# Patient Record
Sex: Female | Born: 1945 | Race: White | Hispanic: No | Smoking: Former smoker
Health system: Southern US, Community
[De-identification: ages and names within clinical notes are randomized; demographics above are authoritative.]

## PROBLEM LIST (undated history)

## (undated) DIAGNOSIS — I499 Cardiac arrhythmia, unspecified: Secondary | ICD-10-CM

## (undated) DIAGNOSIS — J45909 Unspecified asthma, uncomplicated: Secondary | ICD-10-CM

## (undated) DIAGNOSIS — I2510 Atherosclerotic heart disease of native coronary artery without angina pectoris: Secondary | ICD-10-CM

## (undated) DIAGNOSIS — F32A Depression, unspecified: Secondary | ICD-10-CM

## (undated) DIAGNOSIS — H269 Unspecified cataract: Secondary | ICD-10-CM

## (undated) DIAGNOSIS — C4491 Basal cell carcinoma of skin, unspecified: Secondary | ICD-10-CM

## (undated) DIAGNOSIS — F329 Major depressive disorder, single episode, unspecified: Secondary | ICD-10-CM

## (undated) DIAGNOSIS — S32050A Wedge compression fracture of fifth lumbar vertebra, initial encounter for closed fracture: Secondary | ICD-10-CM

## (undated) DIAGNOSIS — M79609 Pain in unspecified limb: Secondary | ICD-10-CM

## (undated) DIAGNOSIS — E7800 Pure hypercholesterolemia, unspecified: Secondary | ICD-10-CM

## (undated) DIAGNOSIS — I714 Abdominal aortic aneurysm, without rupture, unspecified: Secondary | ICD-10-CM

## (undated) HISTORY — DX: Abdominal aortic aneurysm, without rupture, unspecified: I71.40

## (undated) HISTORY — PX: NOSE SURGERY: SHX723

## (undated) HISTORY — PX: ESOPHAGEAL DILATION: SHX303

## (undated) HISTORY — DX: Unspecified cataract: H26.9

## (undated) HISTORY — PX: BREAST ENHANCEMENT SURGERY: SHX7

## (undated) HISTORY — DX: Pain in unspecified limb: M79.609

## (undated) HISTORY — DX: Atherosclerotic heart disease of native coronary artery without angina pectoris: I25.10

## (undated) HISTORY — PX: OTHER SURGICAL HISTORY: SHX169

## (undated) HISTORY — PX: EXCISION VAGINAL CYST: SHX5825

## (undated) HISTORY — DX: Wedge compression fracture of fifth lumbar vertebra, initial encounter for closed fracture: S32.050A

## (undated) HISTORY — PX: ABDOMINAL HYSTERECTOMY: SHX81

## (undated) HISTORY — PX: AUGMENTATION MAMMAPLASTY: SUR837

## (undated) HISTORY — PX: COLONOSCOPY: SHX174

## (undated) HISTORY — PX: HEMORRHOID SURGERY: SHX153

---
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
## 2011-11-13 DIAGNOSIS — Z462 Encounter for fitting and adjustment of other devices related to nervous system and special senses: Secondary | ICD-10-CM | POA: Diagnosis not present

## 2011-11-19 DIAGNOSIS — C44319 Basal cell carcinoma of skin of other parts of face: Secondary | ICD-10-CM | POA: Diagnosis not present

## 2011-11-27 DIAGNOSIS — C44319 Basal cell carcinoma of skin of other parts of face: Secondary | ICD-10-CM | POA: Diagnosis not present

## 2011-11-27 DIAGNOSIS — S0180XA Unspecified open wound of other part of head, initial encounter: Secondary | ICD-10-CM | POA: Diagnosis not present

## 2011-12-20 DIAGNOSIS — M5090 Cervical disc disorder, unspecified, unspecified cervical region: Secondary | ICD-10-CM | POA: Diagnosis not present

## 2011-12-20 DIAGNOSIS — M47812 Spondylosis without myelopathy or radiculopathy, cervical region: Secondary | ICD-10-CM | POA: Diagnosis not present

## 2011-12-20 DIAGNOSIS — M5030 Other cervical disc degeneration, unspecified cervical region: Secondary | ICD-10-CM | POA: Diagnosis not present

## 2011-12-20 DIAGNOSIS — M542 Cervicalgia: Secondary | ICD-10-CM | POA: Diagnosis not present

## 2011-12-20 DIAGNOSIS — M5020 Other cervical disc displacement, unspecified cervical region: Secondary | ICD-10-CM | POA: Diagnosis not present

## 2011-12-24 DIAGNOSIS — R51 Headache: Secondary | ICD-10-CM | POA: Diagnosis not present

## 2011-12-24 DIAGNOSIS — R079 Chest pain, unspecified: Secondary | ICD-10-CM | POA: Diagnosis not present

## 2011-12-24 DIAGNOSIS — M79609 Pain in unspecified limb: Secondary | ICD-10-CM | POA: Diagnosis not present

## 2011-12-26 DIAGNOSIS — M19079 Primary osteoarthritis, unspecified ankle and foot: Secondary | ICD-10-CM | POA: Diagnosis not present

## 2011-12-26 DIAGNOSIS — Q742 Other congenital malformations of lower limb(s), including pelvic girdle: Secondary | ICD-10-CM | POA: Diagnosis not present

## 2011-12-26 DIAGNOSIS — M79609 Pain in unspecified limb: Secondary | ICD-10-CM | POA: Diagnosis not present

## 2011-12-30 DIAGNOSIS — R079 Chest pain, unspecified: Secondary | ICD-10-CM | POA: Diagnosis not present

## 2012-01-03 DIAGNOSIS — M542 Cervicalgia: Secondary | ICD-10-CM | POA: Diagnosis not present

## 2012-01-27 DIAGNOSIS — M629 Disorder of muscle, unspecified: Secondary | ICD-10-CM | POA: Diagnosis not present

## 2012-01-30 DIAGNOSIS — M19079 Primary osteoarthritis, unspecified ankle and foot: Secondary | ICD-10-CM | POA: Diagnosis not present

## 2012-01-30 DIAGNOSIS — M216X9 Other acquired deformities of unspecified foot: Secondary | ICD-10-CM | POA: Diagnosis not present

## 2012-01-30 DIAGNOSIS — Q742 Other congenital malformations of lower limb(s), including pelvic girdle: Secondary | ICD-10-CM | POA: Diagnosis not present

## 2012-01-30 DIAGNOSIS — M79609 Pain in unspecified limb: Secondary | ICD-10-CM | POA: Diagnosis not present

## 2012-02-07 DIAGNOSIS — M47812 Spondylosis without myelopathy or radiculopathy, cervical region: Secondary | ICD-10-CM | POA: Diagnosis not present

## 2012-02-11 DIAGNOSIS — H65199 Other acute nonsuppurative otitis media, unspecified ear: Secondary | ICD-10-CM | POA: Diagnosis not present

## 2012-02-11 DIAGNOSIS — F329 Major depressive disorder, single episode, unspecified: Secondary | ICD-10-CM | POA: Diagnosis not present

## 2012-02-11 DIAGNOSIS — G4700 Insomnia, unspecified: Secondary | ICD-10-CM | POA: Diagnosis not present

## 2012-02-27 DIAGNOSIS — M19079 Primary osteoarthritis, unspecified ankle and foot: Secondary | ICD-10-CM | POA: Diagnosis not present

## 2012-02-27 DIAGNOSIS — M659 Synovitis and tenosynovitis, unspecified: Secondary | ICD-10-CM | POA: Diagnosis not present

## 2012-02-27 DIAGNOSIS — M216X9 Other acquired deformities of unspecified foot: Secondary | ICD-10-CM | POA: Diagnosis not present

## 2012-02-27 DIAGNOSIS — M79609 Pain in unspecified limb: Secondary | ICD-10-CM | POA: Diagnosis not present

## 2012-02-28 DIAGNOSIS — M542 Cervicalgia: Secondary | ICD-10-CM | POA: Diagnosis not present

## 2012-03-02 DIAGNOSIS — M542 Cervicalgia: Secondary | ICD-10-CM | POA: Diagnosis not present

## 2012-03-02 DIAGNOSIS — M47812 Spondylosis without myelopathy or radiculopathy, cervical region: Secondary | ICD-10-CM | POA: Diagnosis not present

## 2012-03-02 DIAGNOSIS — M6281 Muscle weakness (generalized): Secondary | ICD-10-CM | POA: Diagnosis not present

## 2012-03-03 DIAGNOSIS — G4700 Insomnia, unspecified: Secondary | ICD-10-CM | POA: Diagnosis not present

## 2012-03-03 DIAGNOSIS — F329 Major depressive disorder, single episode, unspecified: Secondary | ICD-10-CM | POA: Diagnosis not present

## 2012-03-03 DIAGNOSIS — J209 Acute bronchitis, unspecified: Secondary | ICD-10-CM | POA: Diagnosis not present

## 2012-03-03 DIAGNOSIS — J301 Allergic rhinitis due to pollen: Secondary | ICD-10-CM | POA: Diagnosis not present

## 2012-03-04 DIAGNOSIS — M47812 Spondylosis without myelopathy or radiculopathy, cervical region: Secondary | ICD-10-CM | POA: Diagnosis not present

## 2012-03-04 DIAGNOSIS — M6281 Muscle weakness (generalized): Secondary | ICD-10-CM | POA: Diagnosis not present

## 2012-03-04 DIAGNOSIS — M542 Cervicalgia: Secondary | ICD-10-CM | POA: Diagnosis not present

## 2012-03-05 DIAGNOSIS — M799 Soft tissue disorder, unspecified: Secondary | ICD-10-CM | POA: Diagnosis not present

## 2012-03-05 DIAGNOSIS — M19079 Primary osteoarthritis, unspecified ankle and foot: Secondary | ICD-10-CM | POA: Diagnosis not present

## 2012-03-05 DIAGNOSIS — M25579 Pain in unspecified ankle and joints of unspecified foot: Secondary | ICD-10-CM | POA: Diagnosis not present

## 2012-03-09 DIAGNOSIS — M6281 Muscle weakness (generalized): Secondary | ICD-10-CM | POA: Diagnosis not present

## 2012-03-09 DIAGNOSIS — M542 Cervicalgia: Secondary | ICD-10-CM | POA: Diagnosis not present

## 2012-03-09 DIAGNOSIS — M47812 Spondylosis without myelopathy or radiculopathy, cervical region: Secondary | ICD-10-CM | POA: Diagnosis not present

## 2012-03-11 DIAGNOSIS — M542 Cervicalgia: Secondary | ICD-10-CM | POA: Diagnosis not present

## 2012-03-11 DIAGNOSIS — M47812 Spondylosis without myelopathy or radiculopathy, cervical region: Secondary | ICD-10-CM | POA: Diagnosis not present

## 2012-03-11 DIAGNOSIS — M6281 Muscle weakness (generalized): Secondary | ICD-10-CM | POA: Diagnosis not present

## 2012-03-12 DIAGNOSIS — M659 Synovitis and tenosynovitis, unspecified: Secondary | ICD-10-CM | POA: Diagnosis not present

## 2012-03-12 DIAGNOSIS — M79609 Pain in unspecified limb: Secondary | ICD-10-CM | POA: Diagnosis not present

## 2012-03-12 DIAGNOSIS — M216X9 Other acquired deformities of unspecified foot: Secondary | ICD-10-CM | POA: Diagnosis not present

## 2012-03-12 DIAGNOSIS — M19079 Primary osteoarthritis, unspecified ankle and foot: Secondary | ICD-10-CM | POA: Diagnosis not present

## 2012-03-16 DIAGNOSIS — M47812 Spondylosis without myelopathy or radiculopathy, cervical region: Secondary | ICD-10-CM | POA: Diagnosis not present

## 2012-03-16 DIAGNOSIS — M6281 Muscle weakness (generalized): Secondary | ICD-10-CM | POA: Diagnosis not present

## 2012-03-16 DIAGNOSIS — M542 Cervicalgia: Secondary | ICD-10-CM | POA: Diagnosis not present

## 2012-03-18 DIAGNOSIS — M542 Cervicalgia: Secondary | ICD-10-CM | POA: Diagnosis not present

## 2012-03-18 DIAGNOSIS — M47812 Spondylosis without myelopathy or radiculopathy, cervical region: Secondary | ICD-10-CM | POA: Diagnosis not present

## 2012-03-18 DIAGNOSIS — M6281 Muscle weakness (generalized): Secondary | ICD-10-CM | POA: Diagnosis not present

## 2012-03-24 DIAGNOSIS — M6281 Muscle weakness (generalized): Secondary | ICD-10-CM | POA: Diagnosis not present

## 2012-03-24 DIAGNOSIS — M542 Cervicalgia: Secondary | ICD-10-CM | POA: Diagnosis not present

## 2012-03-24 DIAGNOSIS — M47812 Spondylosis without myelopathy or radiculopathy, cervical region: Secondary | ICD-10-CM | POA: Diagnosis not present

## 2012-03-26 DIAGNOSIS — R1310 Dysphagia, unspecified: Secondary | ICD-10-CM | POA: Diagnosis not present

## 2012-03-26 DIAGNOSIS — R51 Headache: Secondary | ICD-10-CM | POA: Diagnosis not present

## 2012-03-31 DIAGNOSIS — M47812 Spondylosis without myelopathy or radiculopathy, cervical region: Secondary | ICD-10-CM | POA: Diagnosis not present

## 2012-03-31 DIAGNOSIS — M6281 Muscle weakness (generalized): Secondary | ICD-10-CM | POA: Diagnosis not present

## 2012-03-31 DIAGNOSIS — M542 Cervicalgia: Secondary | ICD-10-CM | POA: Diagnosis not present

## 2012-04-07 DIAGNOSIS — M542 Cervicalgia: Secondary | ICD-10-CM | POA: Diagnosis not present

## 2012-04-07 DIAGNOSIS — M47812 Spondylosis without myelopathy or radiculopathy, cervical region: Secondary | ICD-10-CM | POA: Diagnosis not present

## 2012-04-07 DIAGNOSIS — M6281 Muscle weakness (generalized): Secondary | ICD-10-CM | POA: Diagnosis not present

## 2012-04-09 DIAGNOSIS — M659 Synovitis and tenosynovitis, unspecified: Secondary | ICD-10-CM | POA: Diagnosis not present

## 2012-04-09 DIAGNOSIS — M19079 Primary osteoarthritis, unspecified ankle and foot: Secondary | ICD-10-CM | POA: Diagnosis not present

## 2012-04-09 DIAGNOSIS — M79609 Pain in unspecified limb: Secondary | ICD-10-CM | POA: Diagnosis not present

## 2012-04-09 DIAGNOSIS — M216X9 Other acquired deformities of unspecified foot: Secondary | ICD-10-CM | POA: Diagnosis not present

## 2012-04-21 DIAGNOSIS — M6281 Muscle weakness (generalized): Secondary | ICD-10-CM | POA: Diagnosis not present

## 2012-04-21 DIAGNOSIS — M47812 Spondylosis without myelopathy or radiculopathy, cervical region: Secondary | ICD-10-CM | POA: Diagnosis not present

## 2012-04-21 DIAGNOSIS — M542 Cervicalgia: Secondary | ICD-10-CM | POA: Diagnosis not present

## 2012-04-28 DIAGNOSIS — M47812 Spondylosis without myelopathy or radiculopathy, cervical region: Secondary | ICD-10-CM | POA: Diagnosis not present

## 2012-04-28 DIAGNOSIS — M6281 Muscle weakness (generalized): Secondary | ICD-10-CM | POA: Diagnosis not present

## 2012-04-28 DIAGNOSIS — M542 Cervicalgia: Secondary | ICD-10-CM | POA: Diagnosis not present

## 2012-04-29 DIAGNOSIS — M542 Cervicalgia: Secondary | ICD-10-CM | POA: Diagnosis not present

## 2012-04-29 DIAGNOSIS — M47812 Spondylosis without myelopathy or radiculopathy, cervical region: Secondary | ICD-10-CM | POA: Diagnosis not present

## 2012-04-29 DIAGNOSIS — R51 Headache: Secondary | ICD-10-CM | POA: Diagnosis not present

## 2012-06-06 DIAGNOSIS — H43819 Vitreous degeneration, unspecified eye: Secondary | ICD-10-CM | POA: Diagnosis not present

## 2012-06-08 DIAGNOSIS — F329 Major depressive disorder, single episode, unspecified: Secondary | ICD-10-CM | POA: Diagnosis not present

## 2012-06-08 DIAGNOSIS — E782 Mixed hyperlipidemia: Secondary | ICD-10-CM | POA: Diagnosis not present

## 2012-06-08 DIAGNOSIS — J301 Allergic rhinitis due to pollen: Secondary | ICD-10-CM | POA: Diagnosis not present

## 2012-07-27 DIAGNOSIS — R152 Fecal urgency: Secondary | ICD-10-CM | POA: Diagnosis not present

## 2012-07-27 DIAGNOSIS — R159 Full incontinence of feces: Secondary | ICD-10-CM | POA: Diagnosis not present

## 2012-08-03 DIAGNOSIS — F329 Major depressive disorder, single episode, unspecified: Secondary | ICD-10-CM | POA: Diagnosis not present

## 2012-08-03 DIAGNOSIS — Z0000 Encounter for general adult medical examination without abnormal findings: Secondary | ICD-10-CM | POA: Diagnosis not present

## 2012-08-03 DIAGNOSIS — R109 Unspecified abdominal pain: Secondary | ICD-10-CM | POA: Diagnosis not present

## 2012-08-03 DIAGNOSIS — E7800 Pure hypercholesterolemia, unspecified: Secondary | ICD-10-CM | POA: Diagnosis not present

## 2012-08-04 DIAGNOSIS — E7800 Pure hypercholesterolemia, unspecified: Secondary | ICD-10-CM | POA: Diagnosis not present

## 2012-08-04 DIAGNOSIS — R109 Unspecified abdominal pain: Secondary | ICD-10-CM | POA: Diagnosis not present

## 2012-08-04 DIAGNOSIS — F329 Major depressive disorder, single episode, unspecified: Secondary | ICD-10-CM | POA: Diagnosis not present

## 2012-08-04 DIAGNOSIS — Z0000 Encounter for general adult medical examination without abnormal findings: Secondary | ICD-10-CM | POA: Diagnosis not present

## 2012-08-06 ENCOUNTER — Ambulatory Visit: Payer: Self-pay | Admitting: Family Medicine

## 2012-08-18 DIAGNOSIS — E7800 Pure hypercholesterolemia, unspecified: Secondary | ICD-10-CM | POA: Diagnosis not present

## 2012-08-18 DIAGNOSIS — M542 Cervicalgia: Secondary | ICD-10-CM | POA: Diagnosis not present

## 2012-08-24 DIAGNOSIS — R150 Incomplete defecation: Secondary | ICD-10-CM | POA: Diagnosis not present

## 2012-08-24 DIAGNOSIS — R159 Full incontinence of feces: Secondary | ICD-10-CM | POA: Diagnosis not present

## 2012-08-31 DIAGNOSIS — M542 Cervicalgia: Secondary | ICD-10-CM | POA: Diagnosis not present

## 2012-08-31 DIAGNOSIS — R9409 Abnormal results of other function studies of central nervous system: Secondary | ICD-10-CM | POA: Diagnosis not present

## 2012-08-31 DIAGNOSIS — G5600 Carpal tunnel syndrome, unspecified upper limb: Secondary | ICD-10-CM | POA: Diagnosis not present

## 2012-08-31 DIAGNOSIS — R209 Unspecified disturbances of skin sensation: Secondary | ICD-10-CM | POA: Diagnosis not present

## 2012-09-01 ENCOUNTER — Other Ambulatory Visit: Payer: Self-pay | Admitting: Neurology

## 2012-09-01 DIAGNOSIS — R9409 Abnormal results of other function studies of central nervous system: Secondary | ICD-10-CM

## 2012-09-07 ENCOUNTER — Ambulatory Visit
Admission: RE | Admit: 2012-09-07 | Discharge: 2012-09-07 | Disposition: A | Discharge status: Home / Self Care | Payer: Medicare Other | Source: Ambulatory Visit | Attending: Neurology | Admitting: Neurology

## 2012-09-07 DIAGNOSIS — R51 Headache: Secondary | ICD-10-CM | POA: Diagnosis not present

## 2012-09-07 DIAGNOSIS — M542 Cervicalgia: Secondary | ICD-10-CM | POA: Diagnosis not present

## 2012-09-07 DIAGNOSIS — R9409 Abnormal results of other function studies of central nervous system: Secondary | ICD-10-CM | POA: Diagnosis not present

## 2012-09-07 DIAGNOSIS — R42 Dizziness and giddiness: Secondary | ICD-10-CM | POA: Diagnosis not present

## 2012-09-07 MED ORDER — IOHEXOL 300 MG/ML  SOLN
75.0000 mL | Freq: Once | INTRAMUSCULAR | Status: AC | PRN
  Administered 2012-09-07: 75 mL via INTRAVENOUS

## 2012-10-08 DIAGNOSIS — R209 Unspecified disturbances of skin sensation: Secondary | ICD-10-CM | POA: Diagnosis not present

## 2012-11-03 DIAGNOSIS — E7800 Pure hypercholesterolemia, unspecified: Secondary | ICD-10-CM | POA: Diagnosis not present

## 2012-11-03 DIAGNOSIS — F329 Major depressive disorder, single episode, unspecified: Secondary | ICD-10-CM | POA: Diagnosis not present

## 2012-11-03 DIAGNOSIS — R197 Diarrhea, unspecified: Secondary | ICD-10-CM | POA: Diagnosis not present

## 2012-11-03 DIAGNOSIS — Z0000 Encounter for general adult medical examination without abnormal findings: Secondary | ICD-10-CM | POA: Diagnosis not present

## 2012-12-09 DIAGNOSIS — C4491 Basal cell carcinoma of skin, unspecified: Secondary | ICD-10-CM

## 2012-12-09 HISTORY — DX: Basal cell carcinoma of skin, unspecified: C44.91

## 2013-01-07 DIAGNOSIS — C44319 Basal cell carcinoma of skin of other parts of face: Secondary | ICD-10-CM | POA: Diagnosis not present

## 2013-01-07 DIAGNOSIS — C44111 Basal cell carcinoma of skin of unspecified eyelid, including canthus: Secondary | ICD-10-CM | POA: Diagnosis not present

## 2013-01-27 ENCOUNTER — Emergency Department (HOSPITAL_COMMUNITY): Payer: Medicare Other

## 2013-01-27 ENCOUNTER — Emergency Department (HOSPITAL_COMMUNITY)
Admission: EM | Admit: 2013-01-27 | Discharge: 2013-01-27 | Disposition: A | Discharge status: Home / Self Care | Payer: Medicare Other | Attending: Emergency Medicine | Admitting: Emergency Medicine

## 2013-01-27 ENCOUNTER — Encounter (HOSPITAL_COMMUNITY): Payer: Self-pay | Admitting: *Deleted

## 2013-01-27 DIAGNOSIS — R110 Nausea: Secondary | ICD-10-CM | POA: Insufficient documentation

## 2013-01-27 DIAGNOSIS — Z79899 Other long term (current) drug therapy: Secondary | ICD-10-CM | POA: Insufficient documentation

## 2013-01-27 DIAGNOSIS — R918 Other nonspecific abnormal finding of lung field: Secondary | ICD-10-CM | POA: Diagnosis not present

## 2013-01-27 DIAGNOSIS — G8911 Acute pain due to trauma: Secondary | ICD-10-CM | POA: Diagnosis not present

## 2013-01-27 DIAGNOSIS — M5020 Other cervical disc displacement, unspecified cervical region: Secondary | ICD-10-CM | POA: Diagnosis not present

## 2013-01-27 DIAGNOSIS — R079 Chest pain, unspecified: Secondary | ICD-10-CM | POA: Diagnosis not present

## 2013-01-27 DIAGNOSIS — Z7982 Long term (current) use of aspirin: Secondary | ICD-10-CM | POA: Diagnosis not present

## 2013-01-27 DIAGNOSIS — M549 Dorsalgia, unspecified: Secondary | ICD-10-CM | POA: Diagnosis not present

## 2013-01-27 DIAGNOSIS — F17200 Nicotine dependence, unspecified, uncomplicated: Secondary | ICD-10-CM | POA: Insufficient documentation

## 2013-01-27 DIAGNOSIS — M5030 Other cervical disc degeneration, unspecified cervical region: Secondary | ICD-10-CM | POA: Diagnosis not present

## 2013-01-27 DIAGNOSIS — IMO0001 Reserved for inherently not codable concepts without codable children: Secondary | ICD-10-CM | POA: Insufficient documentation

## 2013-01-27 DIAGNOSIS — M2540 Effusion, unspecified joint: Secondary | ICD-10-CM | POA: Insufficient documentation

## 2013-01-27 DIAGNOSIS — M542 Cervicalgia: Secondary | ICD-10-CM

## 2013-01-27 LAB — COMPREHENSIVE METABOLIC PANEL
Albumin: 3.9 g/dL (ref 3.5–5.2)
Alkaline Phosphatase: 70 U/L (ref 39–117)
BUN: 13 mg/dL (ref 6–23)
Calcium: 9.1 mg/dL (ref 8.4–10.5)
Creatinine, Ser: 0.87 mg/dL (ref 0.50–1.10)
GFR calc Af Amer: 78 mL/min — ABNORMAL LOW (ref >90)
Glucose, Bld: 89 mg/dL (ref 70–99)
Total Protein: 6.8 g/dL (ref 6.0–8.3)

## 2013-01-27 LAB — CBC
HCT: 41.6 % (ref 36.0–46.0)
Hemoglobin: 14.1 g/dL (ref 12.0–15.0)
MCH: 29.5 pg (ref 26.0–34.0)
MCHC: 33.9 g/dL (ref 30.0–36.0)
MCV: 87 fL (ref 78.0–100.0)
RDW: 12.4 % (ref 11.5–15.5)

## 2013-01-27 LAB — POCT I-STAT TROPONIN I: Troponin i, poc: 0 ng/mL (ref 0.00–0.08)

## 2013-01-27 MED ORDER — OXYCODONE-ACETAMINOPHEN 5-325 MG PO TABS: 2.0000 | ORAL_TABLET | ORAL | Status: DC | PRN

## 2013-01-27 MED ORDER — PREDNISONE 10 MG PO TABS: ORAL_TABLET | ORAL | Status: DC

## 2013-01-27 NOTE — ED Notes (Signed)
MD at bedside. 

## 2013-01-27 NOTE — ED Notes (Signed)
Pt reports left sided jaw pain, left arm pain, chest heaviness, back pain x 1 week. States pain has been continual, getting worse. States arm feels as if it is asleep from neck down to shoulder and arm. Reports nausea. Denies shortness of breath.

## 2013-01-27 NOTE — ED Provider Notes (Signed)
History     CSN: [id number]  Arrival date & time 01/27/13  1003   First MD Initiated Contact with Patient 01/27/13 1029      Chief Complaint  Patient presents with  . Jaw Pain  . Chest Pain  . Nausea    (Consider location/radiation/quality/duration/timing/severity/associated sxs/prior treatment) Patient is a 67 y.o. female presenting with neck injury. The history is provided by the patient. No language interpreter was used.  Neck Injury The current episode started in the past 7 days. The problem occurs constantly. The problem has been unchanged. Associated symptoms include chest pain, joint swelling and myalgias. Associated symptoms comments: Pt complains of pain going down her neck into her left arm,  Pt reports pain in her left arm,   Pt reports pain has been in neck.  Pt reports she also has pain in her jaw. . Nothing aggravates the symptoms. She has tried nothing for the symptoms. The treatment provided moderate relief.    History reviewed. No pertinent past medical history.  Past Surgical History  Procedure Laterality Date  . Metal stimulator for bowel control      No family history on file.  History  Substance Use Topics  . Smoking status: Current Every Day Smoker -- 0.50 packs/day    Types: Cigarettes  . Smokeless tobacco: Not on file  . Alcohol Use: No    OB History   Grav Para Term Preterm Abortions TAB SAB Ect Mult Living                  Review of Systems  Cardiovascular: Positive for chest pain.  Musculoskeletal: Positive for myalgias, back pain and joint swelling.  All other systems reviewed and are negative.    Allergies  Codeine and Nsaids  Home Medications   Current Outpatient Rx  Name  Route  Sig  Dispense  Refill  . citalopram (CELEXA) 20 MG tablet   Oral   Take 10 mg by mouth daily.         . diphenoxylate-atropine (LOMOTIL) 2.5-0.025 MG per tablet   Oral   Take 1 tablet by mouth 2 (two) times daily as needed for diarrhea or loose  stools.         . lovastatin (MEVACOR) 40 MG tablet   Oral   Take 40 mg by mouth at bedtime.         . Psyllium (METAMUCIL PO)   Oral   Take 1 packet by mouth daily.         Marland Kitchen tiZANidine (ZANAFLEX) 4 MG tablet   Oral   Take 4-6 mg by mouth at bedtime as needed (muscle relaxation).         Marland Kitchen aspirin 81 MG chewable tablet   Oral   Chew 81 mg by mouth daily as needed (chest pain).         Marland Kitchen ibuprofen (ADVIL,MOTRIN) 200 MG tablet   Oral   Take 600 mg by mouth daily as needed for pain.           BP 140/66  Pulse 65  Temp(Src) 98.4 F (36.9 C) (Oral)  Resp 11  SpO2 98%  Physical Exam  Nursing note and vitals reviewed. Constitutional: She is oriented to person, place, and time.  HENT:  Head: Normocephalic and atraumatic.  Right Ear: External ear normal.  Left Ear: External ear normal.  Nose: Nose normal.  Mouth/Throat: Oropharynx is clear and moist.  Eyes: Conjunctivae and EOM are normal. Pupils are equal, round,  and reactive to light.  Neck: Normal range of motion. Neck supple.  Cardiovascular: Normal rate and normal heart sounds.   Pulmonary/Chest: Effort normal and breath sounds normal.  Abdominal: Soft. Bowel sounds are normal.  Musculoskeletal: Normal range of motion.  Neurological: She is alert and oriented to person, place, and time. She has normal reflexes.  Skin: Skin is warm.  Psychiatric: She has a normal mood and affect.    ED Course  Procedures (including critical care time)  Labs Reviewed  COMPREHENSIVE METABOLIC PANEL - Abnormal; Notable for the following:    GFR calc non Af Amer 67 (*)    GFR calc Af Amer 78 (*)    All other components within normal limits  CBC  POCT I-STAT TROPONIN I   Dg Chest 2 View  01/27/2013  *RADIOLOGY REPORT*  Clinical Data: Left arm pain, jaw pain.  CHEST - 2 VIEW  Comparison: None.  Findings: Calcified granuloma in the right upper lobe.  Heart is normal size.  No acute airspace opacities or effusions.  No  acute bony abnormality.  Mild hyperinflation of the lungs.  IMPRESSION: Hyperinflation/chronic changes.  No acute findings.   Original Report Authenticated By: Charlett Nose, M.D.    Ct Cervical Spine Wo Contrast  01/27/2013  *RADIOLOGY REPORT*  Clinical Data: Left arm numbness, weakness.  CT CERVICAL SPINE WITHOUT CONTRAST  Technique:  Multidetector CT imaging of the cervical spine was performed. Multiplanar CT image reconstructions were also generated.  Comparison: None.  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  Mild left neural foraminal narrowing at C4-5 due to uncovertebral spurring.  Possible early bilateral neural foraminal narrowing at C5-6. Small central disc herniation noted at C4-5.  No fracture.  Mild cervical straightening.  No subluxation. Prevertebral soft tissues are normal.  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  Small central disc herniation at C3-4.  No acute findings.   Original Report Authenticated By: Charlett Nose, M.D.      No diagnosis found.    MDM      Date: 01/27/2013  Rate: 63  Rhythm: normal sinus rhythm  QRS Axis: normal  Intervals: normal  ST/T Wave abnormalities: normal  Conduction Disutrbances:none  Narrative Interpretation:   Old EKG Reviewed: unchanged  Dr. Anitra Lauth in to see and examine.   Pt given number for neurosurgeon,  Pt given rx for percocet and Prednisone    Elson Areas, PA-C 01/27/13 1331

## 2013-01-27 NOTE — ED Notes (Signed)
Patient transported to CT 

## 2013-01-29 NOTE — ED Provider Notes (Signed)
Medical screening examination/treatment/procedure(s) were conducted as a shared visit with non-physician practitioner(s) and myself.  I personally evaluated the patient during the encounter Pt with atypical story for CP.  TIMI 0 and no risk factors.    EKG wnl.  CXR, CBC, BMP, troponin all wnl.  Feel pt's sx are due to cervical radiculopathy and cardiac pathology.   Gwyneth Sprout, MD 01/29/13 [phone]

## 2013-02-09 DIAGNOSIS — M47812 Spondylosis without myelopathy or radiculopathy, cervical region: Secondary | ICD-10-CM | POA: Diagnosis not present

## 2013-02-09 DIAGNOSIS — G5600 Carpal tunnel syndrome, unspecified upper limb: Secondary | ICD-10-CM | POA: Diagnosis not present

## 2013-02-09 DIAGNOSIS — M4120 Other idiopathic scoliosis, site unspecified: Secondary | ICD-10-CM | POA: Diagnosis not present

## 2013-02-12 DIAGNOSIS — M5412 Radiculopathy, cervical region: Secondary | ICD-10-CM | POA: Diagnosis not present

## 2013-02-12 DIAGNOSIS — M542 Cervicalgia: Secondary | ICD-10-CM | POA: Diagnosis not present

## 2013-02-16 DIAGNOSIS — M5412 Radiculopathy, cervical region: Secondary | ICD-10-CM | POA: Diagnosis not present

## 2013-02-16 DIAGNOSIS — M542 Cervicalgia: Secondary | ICD-10-CM | POA: Diagnosis not present

## 2013-02-18 DIAGNOSIS — E7800 Pure hypercholesterolemia, unspecified: Secondary | ICD-10-CM | POA: Diagnosis not present

## 2013-02-18 DIAGNOSIS — Z0000 Encounter for general adult medical examination without abnormal findings: Secondary | ICD-10-CM | POA: Diagnosis not present

## 2013-02-18 DIAGNOSIS — F329 Major depressive disorder, single episode, unspecified: Secondary | ICD-10-CM | POA: Diagnosis not present

## 2013-02-18 DIAGNOSIS — M5412 Radiculopathy, cervical region: Secondary | ICD-10-CM | POA: Diagnosis not present

## 2013-02-19 DIAGNOSIS — M5412 Radiculopathy, cervical region: Secondary | ICD-10-CM | POA: Diagnosis not present

## 2013-02-19 DIAGNOSIS — M542 Cervicalgia: Secondary | ICD-10-CM | POA: Diagnosis not present

## 2013-02-23 DIAGNOSIS — M542 Cervicalgia: Secondary | ICD-10-CM | POA: Diagnosis not present

## 2013-02-23 DIAGNOSIS — M5412 Radiculopathy, cervical region: Secondary | ICD-10-CM | POA: Diagnosis not present

## 2013-02-26 DIAGNOSIS — M5412 Radiculopathy, cervical region: Secondary | ICD-10-CM | POA: Diagnosis not present

## 2013-02-26 DIAGNOSIS — M542 Cervicalgia: Secondary | ICD-10-CM | POA: Diagnosis not present

## 2013-03-04 DIAGNOSIS — M5412 Radiculopathy, cervical region: Secondary | ICD-10-CM | POA: Diagnosis not present

## 2013-03-04 DIAGNOSIS — M542 Cervicalgia: Secondary | ICD-10-CM | POA: Diagnosis not present

## 2013-03-10 DIAGNOSIS — M542 Cervicalgia: Secondary | ICD-10-CM | POA: Diagnosis not present

## 2013-03-10 DIAGNOSIS — M5412 Radiculopathy, cervical region: Secondary | ICD-10-CM | POA: Diagnosis not present

## 2013-03-16 DIAGNOSIS — G5620 Lesion of ulnar nerve, unspecified upper limb: Secondary | ICD-10-CM | POA: Diagnosis not present

## 2013-03-16 DIAGNOSIS — G5600 Carpal tunnel syndrome, unspecified upper limb: Secondary | ICD-10-CM | POA: Diagnosis not present

## 2013-06-30 ENCOUNTER — Emergency Department (HOSPITAL_COMMUNITY)
Admission: EM | Admit: 2013-06-30 | Discharge: 2013-06-30 | Disposition: A | Discharge status: Home / Self Care | Payer: Medicare Other | Attending: Emergency Medicine | Admitting: Emergency Medicine

## 2013-06-30 ENCOUNTER — Emergency Department (HOSPITAL_COMMUNITY): Payer: Medicare Other

## 2013-06-30 ENCOUNTER — Encounter (HOSPITAL_COMMUNITY): Payer: Self-pay | Admitting: Emergency Medicine

## 2013-06-30 DIAGNOSIS — M546 Pain in thoracic spine: Secondary | ICD-10-CM | POA: Insufficient documentation

## 2013-06-30 DIAGNOSIS — F3289 Other specified depressive episodes: Secondary | ICD-10-CM | POA: Insufficient documentation

## 2013-06-30 DIAGNOSIS — M6281 Muscle weakness (generalized): Secondary | ICD-10-CM | POA: Insufficient documentation

## 2013-06-30 DIAGNOSIS — F17200 Nicotine dependence, unspecified, uncomplicated: Secondary | ICD-10-CM | POA: Insufficient documentation

## 2013-06-30 DIAGNOSIS — F329 Major depressive disorder, single episode, unspecified: Secondary | ICD-10-CM | POA: Diagnosis not present

## 2013-06-30 DIAGNOSIS — M549 Dorsalgia, unspecified: Secondary | ICD-10-CM

## 2013-06-30 DIAGNOSIS — E7800 Pure hypercholesterolemia, unspecified: Secondary | ICD-10-CM | POA: Insufficient documentation

## 2013-06-30 DIAGNOSIS — R0602 Shortness of breath: Secondary | ICD-10-CM | POA: Diagnosis not present

## 2013-06-30 DIAGNOSIS — Z8709 Personal history of other diseases of the respiratory system: Secondary | ICD-10-CM | POA: Diagnosis not present

## 2013-06-30 DIAGNOSIS — Z79899 Other long term (current) drug therapy: Secondary | ICD-10-CM | POA: Diagnosis not present

## 2013-06-30 HISTORY — DX: Depression, unspecified: F32.A

## 2013-06-30 HISTORY — DX: Major depressive disorder, single episode, unspecified: F32.9

## 2013-06-30 HISTORY — DX: Pure hypercholesterolemia, unspecified: E78.00

## 2013-06-30 LAB — COMPREHENSIVE METABOLIC PANEL
ALT: 16 U/L (ref 0–35)
Albumin: 4 g/dL (ref 3.5–5.2)
Alkaline Phosphatase: 64 U/L (ref 39–117)
Chloride: 103 mEq/L (ref 96–112)
Glucose, Bld: 95 mg/dL (ref 70–99)
Potassium: 4.3 mEq/L (ref 3.5–5.1)
Sodium: 139 mEq/L (ref 135–145)
Total Bilirubin: 0.5 mg/dL (ref 0.3–1.2)
Total Protein: 6.8 g/dL (ref 6.0–8.3)

## 2013-06-30 LAB — CBC WITH DIFFERENTIAL/PLATELET
Basophils Relative: 0 % (ref 0–1)
Eosinophils Absolute: 0.1 10*3/uL (ref 0.0–0.7)
Hemoglobin: 14 g/dL (ref 12.0–15.0)
Lymphs Abs: 1.8 10*3/uL (ref 0.7–4.0)
MCH: 29.2 pg (ref 26.0–34.0)
Neutro Abs: 3.2 10*3/uL (ref 1.7–7.7)
Neutrophils Relative %: 60 % (ref 43–77)
Platelets: 183 10*3/uL (ref 150–400)
RBC: 4.8 MIL/uL (ref 3.87–5.11)
WBC: 5.3 10*3/uL (ref 4.0–10.5)

## 2013-06-30 LAB — URINALYSIS W MICROSCOPIC + REFLEX CULTURE
Glucose, UA: NEGATIVE mg/dL
Hgb urine dipstick: NEGATIVE
Specific Gravity, Urine: 1.01 (ref 1.005–1.030)

## 2013-06-30 MED ORDER — DIAZEPAM 5 MG PO TABS: 5.0000 mg | ORAL_TABLET | Freq: Two times a day (BID) | ORAL | Status: DC | PRN

## 2013-06-30 MED ORDER — IOHEXOL 350 MG/ML SOLN
100.0000 mL | Freq: Once | INTRAVENOUS | Status: AC | PRN
  Administered 2013-06-30: 100 mL via INTRAVENOUS

## 2013-06-30 MED ORDER — TRAMADOL HCL 50 MG PO TABS
50.0000 mg | ORAL_TABLET | Freq: Once | ORAL | Status: AC
  Administered 2013-06-30: 50 mg via ORAL
  Filled 2013-06-30: qty 1

## 2013-06-30 MED ORDER — TRAMADOL HCL 50 MG PO TABS: 50.0000 mg | ORAL_TABLET | Freq: Four times a day (QID) | ORAL | Status: DC | PRN

## 2013-06-30 NOTE — ED Notes (Signed)
Back pain that started  This am after coffee denies injury  Pain radiates down thru back

## 2013-06-30 NOTE — ED Provider Notes (Signed)
CSN: [id number]     Arrival date & time 06/30/13  1013 History  This chart was scribed for non-physician practitioner Jaynie Crumble, PA-C working with Suzi Roots, MD by Valera Castle, ED scribe. This patient was seen in room TR08C/TR08C and the patient's care was started at 11:45 AM.    Chief Complaint  Patient presents with  . Back Pain    Patient is a 67 y.o. female presenting with back pain. The history is provided by the patient. No language interpreter was used.  Back Pain Location:  Generalized Quality:  Aching Lambert Mody.) Radiates to: Down throughout her back. Pain severity:  Moderate Onset quality:  Sudden Duration: Earlier this morning. Timing:  Constant Chronicity:  New Context comment:  Pt reports pain started after drinking coffee. Relieved by:  Nothing Associated symptoms: no fever    HPI Comments: Brenda Franklin is a 67 y.o. female with a h/o hypercholesterolemia and depression who presents to the Emergency Department complaining of sudden, sharp, aching, constant, bilateral back pain, onset this morning after drinking coffee and doing some of her morning chores. She reports that the pain radiates down through her back and that deep breathing, talking, and twisting exacerbates the pain to a sharp pain. She reports associated weakness to her legs. She denies h/o back pain and denies any recent injury. She reports taking a muscle relaxant, Linodil, and Celexa PTA, with no relief. She reports having a metal stimulator to aid with bowel movement, but having turned it off a few months ago due to difficulty of use. She reports that her bowel symptoms have since relieved. She also reports recently having a URI. She denies SOB, abdominal pain, and any other associated symptoms. She has an allergy to Codeine and Nsaids. She is a 1/2 PPD smoker, but denies EtOH. She denies any other medical history.   Past Medical History  Diagnosis Date  . High cholesterol   . Depression    Past  Surgical History  Procedure Laterality Date  . Metal stimulator for bowel control     No family history on file. History  Substance Use Topics  . Smoking status: Current Every Day Smoker -- 0.50 packs/day    Types: Cigarettes  . Smokeless tobacco: Not on file  . Alcohol Use: No   OB History   Grav Para Term Preterm Abortions TAB SAB Ect Mult Living                 Review of Systems  Constitutional: Negative for fever.  Musculoskeletal: Positive for back pain.  All other systems reviewed and are negative.    Allergies  Codeine and Nsaids  Home Medications   Current Outpatient Rx  Name  Route  Sig  Dispense  Refill  . citalopram (CELEXA) 20 MG tablet   Oral   Take by mouth daily.          . diphenoxylate-atropine (LOMOTIL) 2.5-0.025 MG per tablet   Oral   Take 1 tablet by mouth 2 (two) times daily as needed for diarrhea or loose stools.         . lovastatin (MEVACOR) 40 MG tablet   Oral   Take 40 mg by mouth at bedtime.         Marland Kitchen OVER THE COUNTER MEDICATION   Oral   Take 1 tablet by mouth daily as needed (headache).         . Psyllium (METAMUCIL PO)   Oral   Take 1 packet by mouth  daily.         . tiZANidine (ZANAFLEX) 4 MG tablet   Oral   Take 4-6 mg by mouth at bedtime as needed (muscle relaxation).          Triage Vitals: BP 139/71  Temp(Src) 97.6 F (36.4 C) (Oral)  Resp 24  Ht 5\' 4"  (1.626 m)  Wt 130 lb (58.968 kg)  BMI 22.3 kg/m2  SpO2 99%  Physical Exam  Nursing note and vitals reviewed. Constitutional: She is oriented to person, place, and time. She appears well-developed and well-nourished. No distress.  HENT:  Head: Normocephalic and atraumatic.  Eyes: EOM are normal.  Neck: Neck supple. No tracheal deviation present.  Cardiovascular: Normal rate.   Pulmonary/Chest: Effort normal and breath sounds normal. No respiratory distress. She has no wheezes. She has no rales.  Abdominal: Soft. Bowel sounds are normal. There is no  tenderness.  Genitourinary:  No CVA tenderness.  Musculoskeletal: Normal range of motion.  No thoracic, lumbar, midline, or perivertebral tenderness.  Neurological: She is alert and oriented to person, place, and time.  Skin: Skin is warm and dry.  Psychiatric: She has a normal mood and affect. Her behavior is normal.    ED Course  Procedures (including critical care time)  DIAGNOSTIC STUDIES: Oxygen Saturation is 99% on room air, normal by my interpretation.    COORDINATION OF CARE: 11:52 AM-Discussed treatment plan which includes CXR, CBC panel, CMP, UA with pt at bedside and pt agreed to plan.     Labs Review Labs Reviewed  COMPREHENSIVE METABOLIC PANEL - Abnormal; Notable for the following:    GFR calc non Af Amer 84 (*)    All other components within normal limits  D-DIMER, QUANTITATIVE - Abnormal; Notable for the following:    D-Dimer, Quant 0.57 (*)    All other components within normal limits  URINALYSIS W MICROSCOPIC + REFLEX CULTURE - Abnormal; Notable for the following:    Leukocytes, UA SMALL (*)    All other components within normal limits  CBC WITH DIFFERENTIAL  LIPASE, BLOOD   Imaging Review Dg Chest 2 View  06/30/2013   CLINICAL DATA:  Shortness breath, back pain  EXAM: CHEST  2 VIEW  COMPARISON:  01/27/2013  FINDINGS: Lungs are essentially clear. No pleural effusion or pneumothorax.  10 mm calcified right upper lobe granuloma, unchanged.  The heart is normal in size.  Mild degenerative changes of the visualized thoracolumbar spine. Lower thoracolumbar dextroscoliosis.  IMPRESSION: No evidence of acute cardiopulmonary disease.   Electronically Signed   By: Charline Bills M.D.   On: 06/30/2013 12:51   Ct Angio Chest W/cm &/or Wo Cm  06/30/2013   CLINICAL DATA:  The mid neck pain shortness of breath.  EXAM: CT ANGIOGRAPHY CHEST WITH CONTRAST  TECHNIQUE: Multidetector CT imaging of the chest was performed using the standard protocol during bolus administration  of intravenous contrast. Multiplanar CT image reconstructions including MIPs were obtained to evaluate the vascular anatomy.  CONTRAST:  OMNIPAQUE IOHEXOL 350 MG/ML SOLN  COMPARISON:  Chest x-ray dated 06/30/2013  FINDINGS: There are no pulmonary emboli, infiltrates, or effusions. No mass lesions. No hilar or mediastinal adenopathy. There are multiple calcified granulomas in the lungs as well as calcified lymph nodes in the hila and mediastinum consistent with prior granulomatous disease. No osseous abnormality. The visualized portion of the upper abdomen is normal.  Review of the MIP images confirms the above findings.  IMPRESSION: No pulmonary emboli or other acute abnormalities.  Electronically Signed   By: Geanie Cooley   On: 06/30/2013 15:17    MDM   1. Mid back pain     Patient with pleuritic pain that radiates from the mid bilateral back into a bilateral ribs. Pain is worsened with deep breathing, movement, laying flat. Patient has never had similar pain in the past. Pain does not radiate into the upper lower extremities. There is no chest pain. There is no shortness of breath. Patient is not having any lower back pain. Labwork including d-dimer obtained for this pleuritic pain. D-dimer is back 0.57. I discussed patient with Dr. Madaline Guthrie who has seen her as well. CT chest angiography chest obtained to rule out PE or any other abnormalities that could cause her face. CT images back negative. Patient's pain improved in emergency department with tramadol. I suspect her pain is most likely musculoskeletal. I will discharge her home with tramadol and Valium for spasms. Patient appears very anxious, states she does have history of anxiety. I have instructed her to followup with primary care doctor this week or return if her symptoms are worsening   Filed Vitals:   06/30/13 1018  BP: 139/71  Temp: 97.6 F (36.4 C)  TempSrc: Oral  Resp: 24  Height: 5\' 4"  (1.626 m)  Weight: 130 lb (58.968 kg)   SpO2: 99%    I personally performed the services described in this documentation, which was scribed in my presence. The recorded information has been reviewed and is accurate.   Lottie Mussel, PA-C 06/30/13 1535

## 2013-07-05 NOTE — ED Provider Notes (Signed)
Results for orders placed during the hospital encounter of 06/30/13  CBC WITH DIFFERENTIAL      Result Value Range   WBC 5.3  4.0 - 10.5 K/uL   RBC 4.80  3.87 - 5.11 MIL/uL   Hemoglobin 14.0  12.0 - 15.0 g/dL   HCT 81.1  91.4 - 78.2 %   MCV 88.8  78.0 - 100.0 fL   MCH 29.2  26.0 - 34.0 pg   MCHC 32.9  30.0 - 36.0 g/dL   RDW 95.6  21.3 - 08.6 %   Platelets 183  150 - 400 K/uL   Neutrophils Relative % 60  43 - 77 %   Neutro Abs 3.2  1.7 - 7.7 K/uL   Lymphocytes Relative 34  12 - 46 %   Lymphs Abs 1.8  0.7 - 4.0 K/uL   Monocytes Relative 4  3 - 12 %   Monocytes Absolute 0.2  0.1 - 1.0 K/uL   Eosinophils Relative 2  0 - 5 %   Eosinophils Absolute 0.1  0.0 - 0.7 K/uL   Basophils Relative 0  0 - 1 %   Basophils Absolute 0.0  0.0 - 0.1 K/uL  COMPREHENSIVE METABOLIC PANEL      Result Value Range   Sodium 139  135 - 145 mEq/L   Potassium 4.3  3.5 - 5.1 mEq/L   Chloride 103  96 - 112 mEq/L   CO2 25  19 - 32 mEq/L   Glucose, Bld 95  70 - 99 mg/dL   BUN 13  6 - 23 mg/dL   Creatinine, Ser 5.78  0.50 - 1.10 mg/dL   Calcium 8.7  8.4 - 46.9 mg/dL   Total Protein 6.8  6.0 - 8.3 g/dL   Albumin 4.0  3.5 - 5.2 g/dL   AST 20  0 - 37 U/L   ALT 16  0 - 35 U/L   Alkaline Phosphatase 64  39 - 117 U/L   Total Bilirubin 0.5  0.3 - 1.2 mg/dL   GFR calc non Af Amer 84 (*) >90 mL/min   GFR calc Af Amer >90  >90 mL/min  LIPASE, BLOOD      Result Value Range   Lipase 30  11 - 59 U/L  D-DIMER, QUANTITATIVE      Result Value Range   D-Dimer, Quant 0.57 (*) 0.00 - 0.48 ug/mL-FEU  URINALYSIS W MICROSCOPIC + REFLEX CULTURE      Result Value Range   Color, Urine YELLOW  YELLOW   APPearance CLEAR  CLEAR   Specific Gravity, Urine 1.010  1.005 - 1.030   pH 6.0  5.0 - 8.0   Glucose, UA NEGATIVE  NEGATIVE mg/dL   Hgb urine dipstick NEGATIVE  NEGATIVE   Bilirubin Urine NEGATIVE  NEGATIVE   Ketones, ur NEGATIVE  NEGATIVE mg/dL   Protein, ur NEGATIVE  NEGATIVE mg/dL   Urobilinogen, UA 0.2  0.0 - 1.0 mg/dL    Nitrite NEGATIVE  NEGATIVE   Leukocytes, UA SMALL (*) NEGATIVE   WBC, UA 0-2  <3 WBC/hpf   RBC / HPF 0-2  <3 RBC/hpf   Bacteria, UA RARE  RARE   Squamous Epithelial / LPF RARE  RARE   Dg Chest 2 View  06/30/2013   CLINICAL DATA:  Shortness breath, back pain  EXAM: CHEST  2 VIEW  COMPARISON:  01/27/2013  FINDINGS: Lungs are essentially clear. No pleural effusion or pneumothorax.  10 mm calcified right upper lobe granuloma, unchanged.  The  heart is normal in size.  Mild degenerative changes of the visualized thoracolumbar spine. Lower thoracolumbar dextroscoliosis.  IMPRESSION: No evidence of acute cardiopulmonary disease.   Electronically Signed   By: Charline Bills M.D.   On: 06/30/2013 12:51   Ct Angio Chest W/cm &/or Wo Cm  06/30/2013   CLINICAL DATA:  The mid neck pain shortness of breath.  EXAM: CT ANGIOGRAPHY CHEST WITH CONTRAST  TECHNIQUE: Multidetector CT imaging of the chest was performed using the standard protocol during bolus administration of intravenous contrast. Multiplanar CT image reconstructions including MIPs were obtained to evaluate the vascular anatomy.  CONTRAST:  OMNIPAQUE IOHEXOL 350 MG/ML SOLN  COMPARISON:  Chest x-ray dated 06/30/2013  FINDINGS: There are no pulmonary emboli, infiltrates, or effusions. No mass lesions. No hilar or mediastinal adenopathy. There are multiple calcified granulomas in the lungs as well as calcified lymph nodes in the hila and mediastinum consistent with prior granulomatous disease. No osseous abnormality. The visualized portion of the upper abdomen is normal.  Review of the MIP images confirms the above findings.  IMPRESSION: No pulmonary emboli or other acute abnormalities.   Electronically Signed   By: Geanie Cooley   On: 06/30/2013 15:17    Medical screening examination/treatment/procedure(s) were conducted as a shared visit with non-physician practitioner(s) and myself.  I personally evaluated the patient during the encounter Pt c/o  pleuritic upper back/lateral rib pain, constant, ?mild sob at times. No fever or chills. Chest cta. Mildly elev ddimer sent by PA. CT.     Suzi Roots, MD 07/05/13 [phone]

## 2013-09-13 DIAGNOSIS — E7800 Pure hypercholesterolemia, unspecified: Secondary | ICD-10-CM | POA: Diagnosis not present

## 2013-09-13 DIAGNOSIS — Z0000 Encounter for general adult medical examination without abnormal findings: Secondary | ICD-10-CM | POA: Diagnosis not present

## 2013-09-21 DIAGNOSIS — E7800 Pure hypercholesterolemia, unspecified: Secondary | ICD-10-CM | POA: Diagnosis not present

## 2013-09-21 DIAGNOSIS — Z0000 Encounter for general adult medical examination without abnormal findings: Secondary | ICD-10-CM | POA: Diagnosis not present

## 2013-09-21 DIAGNOSIS — F411 Generalized anxiety disorder: Secondary | ICD-10-CM | POA: Diagnosis not present

## 2013-12-01 DIAGNOSIS — L989 Disorder of the skin and subcutaneous tissue, unspecified: Secondary | ICD-10-CM | POA: Diagnosis not present

## 2013-12-02 ENCOUNTER — Other Ambulatory Visit: Payer: Self-pay

## 2013-12-02 ENCOUNTER — Other Ambulatory Visit (HOSPITAL_COMMUNITY): Payer: Self-pay | Admitting: Internal Medicine

## 2013-12-02 DIAGNOSIS — Z1231 Encounter for screening mammogram for malignant neoplasm of breast: Secondary | ICD-10-CM

## 2013-12-10 ENCOUNTER — Ambulatory Visit (HOSPITAL_COMMUNITY)
Admission: RE | Admit: 2013-12-10 | Discharge: 2013-12-10 | Disposition: A | Discharge status: Home / Self Care | Payer: Medicare Other | Source: Ambulatory Visit | Attending: Internal Medicine | Admitting: Internal Medicine

## 2013-12-10 DIAGNOSIS — Z1231 Encounter for screening mammogram for malignant neoplasm of breast: Secondary | ICD-10-CM

## 2013-12-14 ENCOUNTER — Other Ambulatory Visit: Payer: Self-pay | Admitting: Internal Medicine

## 2013-12-14 DIAGNOSIS — N6452 Nipple discharge: Secondary | ICD-10-CM

## 2013-12-14 DIAGNOSIS — N649 Disorder of breast, unspecified: Secondary | ICD-10-CM

## 2013-12-14 DIAGNOSIS — R928 Other abnormal and inconclusive findings on diagnostic imaging of breast: Secondary | ICD-10-CM | POA: Diagnosis not present

## 2013-12-14 DIAGNOSIS — N6459 Other signs and symptoms in breast: Secondary | ICD-10-CM | POA: Diagnosis not present

## 2014-04-08 DIAGNOSIS — E7800 Pure hypercholesterolemia, unspecified: Secondary | ICD-10-CM | POA: Diagnosis not present

## 2014-04-08 DIAGNOSIS — Z0000 Encounter for general adult medical examination without abnormal findings: Secondary | ICD-10-CM | POA: Diagnosis not present

## 2014-09-30 HISTORY — PX: OTHER SURGICAL HISTORY: SHX169

## 2014-10-19 DIAGNOSIS — N289 Disorder of kidney and ureter, unspecified: Secondary | ICD-10-CM | POA: Diagnosis not present

## 2014-10-19 DIAGNOSIS — Z885 Allergy status to narcotic agent status: Secondary | ICD-10-CM | POA: Diagnosis not present

## 2014-10-19 DIAGNOSIS — K5732 Diverticulitis of large intestine without perforation or abscess without bleeding: Secondary | ICD-10-CM | POA: Diagnosis not present

## 2014-10-19 DIAGNOSIS — R112 Nausea with vomiting, unspecified: Secondary | ICD-10-CM | POA: Diagnosis not present

## 2014-10-19 DIAGNOSIS — R1032 Left lower quadrant pain: Secondary | ICD-10-CM | POA: Diagnosis not present

## 2014-10-19 DIAGNOSIS — Z886 Allergy status to analgesic agent status: Secondary | ICD-10-CM | POA: Diagnosis not present

## 2014-10-19 DIAGNOSIS — F17210 Nicotine dependence, cigarettes, uncomplicated: Secondary | ICD-10-CM | POA: Diagnosis not present

## 2015-03-20 DIAGNOSIS — E559 Vitamin D deficiency, unspecified: Secondary | ICD-10-CM | POA: Diagnosis not present

## 2015-03-20 DIAGNOSIS — Z136 Encounter for screening for cardiovascular disorders: Secondary | ICD-10-CM | POA: Diagnosis not present

## 2015-03-20 DIAGNOSIS — Z79899 Other long term (current) drug therapy: Secondary | ICD-10-CM | POA: Diagnosis not present

## 2015-03-20 DIAGNOSIS — I10 Essential (primary) hypertension: Secondary | ICD-10-CM | POA: Diagnosis not present

## 2015-03-20 DIAGNOSIS — Z0000 Encounter for general adult medical examination without abnormal findings: Secondary | ICD-10-CM | POA: Diagnosis not present

## 2015-05-18 DIAGNOSIS — K589 Irritable bowel syndrome without diarrhea: Secondary | ICD-10-CM | POA: Diagnosis not present

## 2015-05-18 DIAGNOSIS — G44209 Tension-type headache, unspecified, not intractable: Secondary | ICD-10-CM | POA: Diagnosis not present

## 2015-05-18 DIAGNOSIS — F430 Acute stress reaction: Secondary | ICD-10-CM | POA: Diagnosis not present

## 2015-07-21 DIAGNOSIS — F329 Major depressive disorder, single episode, unspecified: Secondary | ICD-10-CM | POA: Diagnosis not present

## 2015-07-21 DIAGNOSIS — Z0000 Encounter for general adult medical examination without abnormal findings: Secondary | ICD-10-CM | POA: Diagnosis not present

## 2015-07-21 DIAGNOSIS — E7800 Pure hypercholesterolemia, unspecified: Secondary | ICD-10-CM | POA: Diagnosis not present

## 2015-08-01 DIAGNOSIS — M545 Low back pain: Secondary | ICD-10-CM | POA: Diagnosis not present

## 2015-08-01 DIAGNOSIS — M47816 Spondylosis without myelopathy or radiculopathy, lumbar region: Secondary | ICD-10-CM | POA: Diagnosis not present

## 2015-08-12 DIAGNOSIS — S32040A Wedge compression fracture of fourth lumbar vertebra, initial encounter for closed fracture: Secondary | ICD-10-CM | POA: Diagnosis not present

## 2015-08-12 DIAGNOSIS — M545 Low back pain: Secondary | ICD-10-CM | POA: Diagnosis not present

## 2015-08-17 DIAGNOSIS — M859 Disorder of bone density and structure, unspecified: Secondary | ICD-10-CM | POA: Diagnosis not present

## 2015-08-17 DIAGNOSIS — Z780 Asymptomatic menopausal state: Secondary | ICD-10-CM | POA: Diagnosis not present

## 2015-08-17 DIAGNOSIS — S32000A Wedge compression fracture of unspecified lumbar vertebra, initial encounter for closed fracture: Secondary | ICD-10-CM | POA: Diagnosis not present

## 2015-08-17 DIAGNOSIS — M549 Dorsalgia, unspecified: Secondary | ICD-10-CM | POA: Diagnosis not present

## 2015-08-17 DIAGNOSIS — E559 Vitamin D deficiency, unspecified: Secondary | ICD-10-CM | POA: Diagnosis not present

## 2015-08-17 DIAGNOSIS — T148 Other injury of unspecified body region: Secondary | ICD-10-CM | POA: Diagnosis not present

## 2015-09-09 DIAGNOSIS — S32040A Wedge compression fracture of fourth lumbar vertebra, initial encounter for closed fracture: Secondary | ICD-10-CM | POA: Diagnosis not present

## 2015-10-04 DIAGNOSIS — K5730 Diverticulosis of large intestine without perforation or abscess without bleeding: Secondary | ICD-10-CM | POA: Diagnosis not present

## 2015-10-04 DIAGNOSIS — K5792 Diverticulitis of intestine, part unspecified, without perforation or abscess without bleeding: Secondary | ICD-10-CM | POA: Diagnosis not present

## 2015-10-04 DIAGNOSIS — K5900 Constipation, unspecified: Secondary | ICD-10-CM | POA: Diagnosis not present

## 2015-10-05 DIAGNOSIS — N644 Mastodynia: Secondary | ICD-10-CM | POA: Diagnosis not present

## 2015-10-10 DIAGNOSIS — S32040D Wedge compression fracture of fourth lumbar vertebra, subsequent encounter for fracture with routine healing: Secondary | ICD-10-CM | POA: Diagnosis not present

## 2015-10-10 DIAGNOSIS — M545 Low back pain: Secondary | ICD-10-CM | POA: Diagnosis not present

## 2015-11-01 DIAGNOSIS — K5901 Slow transit constipation: Secondary | ICD-10-CM | POA: Diagnosis not present

## 2015-11-01 DIAGNOSIS — Z8719 Personal history of other diseases of the digestive system: Secondary | ICD-10-CM | POA: Diagnosis not present

## 2015-11-09 DIAGNOSIS — S32050D Wedge compression fracture of fifth lumbar vertebra, subsequent encounter for fracture with routine healing: Secondary | ICD-10-CM | POA: Diagnosis not present

## 2015-11-09 DIAGNOSIS — M545 Low back pain: Secondary | ICD-10-CM | POA: Diagnosis not present

## 2015-12-04 DIAGNOSIS — W5501XA Bitten by cat, initial encounter: Secondary | ICD-10-CM | POA: Diagnosis not present

## 2015-12-04 DIAGNOSIS — S60372A Other superficial bite of left thumb, initial encounter: Secondary | ICD-10-CM | POA: Diagnosis not present

## 2015-12-04 DIAGNOSIS — S61052A Open bite of left thumb without damage to nail, initial encounter: Secondary | ICD-10-CM | POA: Diagnosis not present

## 2015-12-07 DIAGNOSIS — Z203 Contact with and (suspected) exposure to rabies: Secondary | ICD-10-CM | POA: Diagnosis not present

## 2015-12-11 DIAGNOSIS — Z203 Contact with and (suspected) exposure to rabies: Secondary | ICD-10-CM | POA: Diagnosis not present

## 2015-12-18 DIAGNOSIS — Z203 Contact with and (suspected) exposure to rabies: Secondary | ICD-10-CM | POA: Diagnosis not present

## 2016-07-17 DIAGNOSIS — Z0000 Encounter for general adult medical examination without abnormal findings: Secondary | ICD-10-CM | POA: Diagnosis not present

## 2016-07-17 DIAGNOSIS — E7800 Pure hypercholesterolemia, unspecified: Secondary | ICD-10-CM | POA: Diagnosis not present

## 2016-07-22 DIAGNOSIS — M8580 Other specified disorders of bone density and structure, unspecified site: Secondary | ICD-10-CM | POA: Diagnosis not present

## 2016-07-22 DIAGNOSIS — M25551 Pain in right hip: Secondary | ICD-10-CM | POA: Diagnosis not present

## 2016-07-22 DIAGNOSIS — M25552 Pain in left hip: Secondary | ICD-10-CM | POA: Diagnosis not present

## 2016-07-22 DIAGNOSIS — S79911A Unspecified injury of right hip, initial encounter: Secondary | ICD-10-CM | POA: Diagnosis not present

## 2016-07-22 DIAGNOSIS — E7800 Pure hypercholesterolemia, unspecified: Secondary | ICD-10-CM | POA: Diagnosis not present

## 2016-10-07 DIAGNOSIS — Z01818 Encounter for other preprocedural examination: Secondary | ICD-10-CM | POA: Diagnosis not present

## 2016-10-07 DIAGNOSIS — R159 Full incontinence of feces: Secondary | ICD-10-CM | POA: Diagnosis not present

## 2016-10-15 DIAGNOSIS — E785 Hyperlipidemia, unspecified: Secondary | ICD-10-CM | POA: Diagnosis not present

## 2016-10-15 DIAGNOSIS — K219 Gastro-esophageal reflux disease without esophagitis: Secondary | ICD-10-CM | POA: Diagnosis not present

## 2016-10-15 DIAGNOSIS — Z885 Allergy status to narcotic agent status: Secondary | ICD-10-CM | POA: Diagnosis not present

## 2016-10-15 DIAGNOSIS — Z4542 Encounter for adjustment and management of neuropacemaker (brain) (peripheral nerve) (spinal cord): Secondary | ICD-10-CM | POA: Diagnosis not present

## 2016-10-15 DIAGNOSIS — M4856XS Collapsed vertebra, not elsewhere classified, lumbar region, sequela of fracture: Secondary | ICD-10-CM | POA: Diagnosis not present

## 2016-10-15 DIAGNOSIS — R159 Full incontinence of feces: Secondary | ICD-10-CM | POA: Diagnosis not present

## 2016-10-15 DIAGNOSIS — F17210 Nicotine dependence, cigarettes, uncomplicated: Secondary | ICD-10-CM | POA: Diagnosis not present

## 2016-11-04 DIAGNOSIS — Z09 Encounter for follow-up examination after completed treatment for conditions other than malignant neoplasm: Secondary | ICD-10-CM | POA: Diagnosis not present

## 2016-11-04 DIAGNOSIS — R159 Full incontinence of feces: Secondary | ICD-10-CM | POA: Diagnosis not present

## 2016-12-04 DIAGNOSIS — L304 Erythema intertrigo: Secondary | ICD-10-CM | POA: Diagnosis not present

## 2016-12-04 DIAGNOSIS — L720 Epidermal cyst: Secondary | ICD-10-CM | POA: Diagnosis not present

## 2016-12-04 DIAGNOSIS — Z85828 Personal history of other malignant neoplasm of skin: Secondary | ICD-10-CM | POA: Diagnosis not present

## 2016-12-04 DIAGNOSIS — D229 Melanocytic nevi, unspecified: Secondary | ICD-10-CM | POA: Diagnosis not present

## 2017-01-13 DIAGNOSIS — E7800 Pure hypercholesterolemia, unspecified: Secondary | ICD-10-CM | POA: Diagnosis not present

## 2017-01-13 DIAGNOSIS — E559 Vitamin D deficiency, unspecified: Secondary | ICD-10-CM | POA: Diagnosis not present

## 2017-01-13 DIAGNOSIS — M8580 Other specified disorders of bone density and structure, unspecified site: Secondary | ICD-10-CM | POA: Diagnosis not present

## 2017-01-20 DIAGNOSIS — M79602 Pain in left arm: Secondary | ICD-10-CM | POA: Diagnosis not present

## 2017-01-20 DIAGNOSIS — E7800 Pure hypercholesterolemia, unspecified: Secondary | ICD-10-CM | POA: Diagnosis not present

## 2017-01-20 DIAGNOSIS — E559 Vitamin D deficiency, unspecified: Secondary | ICD-10-CM | POA: Diagnosis not present

## 2017-01-20 DIAGNOSIS — Z0000 Encounter for general adult medical examination without abnormal findings: Secondary | ICD-10-CM | POA: Diagnosis not present

## 2017-01-20 DIAGNOSIS — M79601 Pain in right arm: Secondary | ICD-10-CM | POA: Diagnosis not present

## 2017-02-10 ENCOUNTER — Ambulatory Visit (INDEPENDENT_AMBULATORY_CARE_PROVIDER_SITE_OTHER): Payer: Medicare Other | Admitting: Neurology

## 2017-02-10 ENCOUNTER — Encounter: Payer: Self-pay | Admitting: Neurology

## 2017-02-10 ENCOUNTER — Ambulatory Visit
Admission: RE | Admit: 2017-02-10 | Discharge: 2017-02-10 | Disposition: A | Discharge status: Home / Self Care | Payer: Medicare Other | Source: Ambulatory Visit | Attending: Neurology | Admitting: Neurology

## 2017-02-10 DIAGNOSIS — R202 Paresthesia of skin: Secondary | ICD-10-CM

## 2017-02-10 DIAGNOSIS — G8929 Other chronic pain: Secondary | ICD-10-CM | POA: Diagnosis not present

## 2017-02-10 DIAGNOSIS — M25512 Pain in left shoulder: Secondary | ICD-10-CM | POA: Diagnosis not present

## 2017-02-10 DIAGNOSIS — M542 Cervicalgia: Secondary | ICD-10-CM

## 2017-02-10 DIAGNOSIS — S41052A Open bite of left shoulder, initial encounter: Secondary | ICD-10-CM | POA: Diagnosis not present

## 2017-02-10 MED ORDER — NORTRIPTYLINE HCL 25 MG PO CAPS: 50.0000 mg | ORAL_CAPSULE | Freq: Every day | ORAL | 6 refills | Status: DC

## 2017-02-10 NOTE — Progress Notes (Signed)
PATIENT: Brenda Franklin DOB: [birth year]  Chief Complaint  Patient presents with  . Arm Pain    She is here with her daughter, Brenda Franklin.  Reports pain in her bilateral upper extremities worsening since September 2017.  Her symptoms are causing her limited range of motion.  Marland Kitchen PCP    [name], MD     HISTORICAL  Brenda Franklin is a 71 years old right-handed female, accompanied by her daughter Brenda Franklin, seen in refer by her primary care physician Dr. [name] for evaluation of bilateral upper extremity pain, initial evaluation was on Feb 10 2017.  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   She complains of bilateral arm deep achy pain since September 2017, left worse than right, limited range of motion of left shoulder, also complains of upper neck pain, radiating pain to occipital region,, headache, she denies gait abnormality, continued dealing with chronic bowel incontinence, has better controlling her bladder, denies bilateral lower extremity paresthesia, bilateral hands paresthesia  Laboratory evaluation in 2018, normal CMP creatinine of 0.8, LDL of 196, vitamin D was 59,    REVIEW OF SYSTEMS: Full 14 system review of systems performed and notable only for years, incontinence, joint pain, achy muscles, headaches, weakness, depression anxiety not enough sleep decreased energy, disinteresting activities   ALLERGIES: Allergies  Allergen Reactions  . Tolmetin Nausea And Vomiting  . Codeine Nausea And Vomiting and Other (See Comments)    Upset stomach  . Milk-Related Compounds Diarrhea  . Nsaids Diarrhea and Other (See Comments)    Stomach upset  . Oxycodone Nausea And Vomiting    HOME  MEDICATIONS: Current Outpatient Prescriptions  Medication Sig Dispense Refill  . AMINO ACIDS PO Take by mouth. Takes 2-3 daily.    . citalopram (CELEXA) 20 MG tablet Take by mouth daily.     . diphenoxylate-atropine (LOMOTIL) 2.5-0.025 MG per tablet Take 1 tablet by mouth 2 (two) times daily as needed for diarrhea or loose stools.    . lovastatin (MEVACOR) 40 MG tablet Take 40 mg by mouth at bedtime.    Marland Kitchen tiZANidine (ZANAFLEX) 4 MG tablet Take 4-6 mg by mouth at bedtime as needed (muscle relaxation).    Marland Kitchen UNABLE TO FIND Maxi-Zyme:  Takes 2-3 per day.    . Vitamin D, Ergocalciferol, (DRISDOL) 50000 units CAPS capsule Take 50,000 Units by mouth every 7 (seven) days.     No current facility-administered medications for this visit.     PAST MEDICAL HISTORY: Past Medical History:  Diagnosis Date  . Cataracts, bilateral   . Compression fracture of fifth lumbar vertebra ([place])   . Depression   . Extremity pain   . High cholesterol     PAST SURGICAL HISTORY: Past Surgical History:  Procedure Laterality Date  . ABDOMINAL HYSTERECTOMY    . BREAST ENHANCEMENT SURGERY    . COLONOSCOPY    . ESOPHAGEAL DILATION    . EXCISION VAGINAL CYST    . HEMORRHOID SURGERY    . metal stimulator for bowel control     Removed  . NOSE SURGERY      FAMILY HISTORY: Family History  Problem Relation Age of Onset  . Other Mother  Intestinal problem - gangrene  . Cancer Father        Bile Duct Cancer  . Heart disease Father   . Heart attack Father   . Hypercholesterolemia Father   . Kidney cancer Sister   . Kidney cancer Brother     SOCIAL HISTORY:  Social History   Social History  . Marital status: Single    Spouse name: N/A  . Number of children: 4  . Years of education: 1 year of college   Occupational History  . Retired    Social History Main Topics  . Smoking status: Current Every Day Smoker    Packs/day: 0.75    Types: Cigarettes  . Smokeless tobacco: Never Used  .  Alcohol use No  . Drug use: No  . Sexual activity: Not on file   Other Topics Concern  . Not on file   Social History Narrative   Lives at home with her daughter.   Right-handed.   2-3 cups caffeine daily.     PHYSICAL EXAM   Vitals:   02/10/17 1416  BP: 124/70  Pulse: 68  Weight: 139 lb 12 oz (63.4 kg)  Height: 5' 4.25" (1.632 m)    Not recorded      Body mass index is 23.8 kg/m.  PHYSICAL EXAMNIATION:  Gen: NAD, conversant, well nourised, obese, well groomed                     Cardiovascular: Regular rate rhythm, no peripheral edema, warm, nontender. Eyes: Conjunctivae clear without exudates or hemorrhage Neck: Supple, no carotid bruits. Pulmonary: Clear to auscultation bilaterally   NEUROLOGICAL EXAM:  MENTAL STATUS: Speech:    Speech is normal; fluent and spontaneous with normal comprehension.  Cognition:     Orientation to time, place and person     Normal recent and remote memory     Normal Attention span and concentration     Normal Language, naming, repeating,spontaneous speech     Fund of knowledge   CRANIAL NERVES: CN II: Visual fields are full to confrontation. Fundoscopic exam is normal with sharp discs and no vascular changes. Pupils are round equal and briskly reactive to light. CN III, IV, VI: extraocular movement are normal. No ptosis. CN V: Facial sensation is intact to pinprick in all 3 divisions bilaterally. Corneal responses are intact.  CN VII: Face is symmetric with normal eye closure and smile. CN VIII: Hearing is normal to rubbing fingers CN IX, X: Palate elevates symmetrically. Phonation is normal. CN XI: Head turning and shoulder shrug are intact CN XII: Tongue is midline with normal movements and no atrophy.  MOTOR: There is no pronator drift of out-stretched arms. Muscle bulk and tone are normal. Muscle strength is normal.With exception of limited range of motion of left shoulder   REFLEXES: Reflexes are 2+ and symmetric at  the biceps, triceps, knees, and ankles. Plantar responses are flexor.  SENSORY: Intact to light touch, pinprick, positional sensation and vibratory sensation are intact in fingers and toes.  COORDINATION: Rapid alternating movements and fine finger movements are intact. There is no dysmetria on finger-to-nose and heel-knee-shin.    GAIT/STANCE: Posture is normal. Gait is steady with normal steps, base, arm swing, and turning. Heel and toe walking are normal. Tandem gait is normal.  Romberg is absent.   DIAGNOSTIC DATA (LABS, IMAGING, TESTING) - I reviewed patient records, labs, notes, testing and imaging myself where available.   ASSESSMENT AND PLAN  Brenda Franklin  is a 71 y.o. female   Neck pain, radiating pain to bilateral upper extremity left worse than right  MRI of cervical spine to rule out cervical radiculopathy  EMG nerve conduction study  Nortriptyline 25 mg titrating to 50 mg every night for nighttime pain History of chronic bowel incontinence  Left shoulder pain  X-ray of left shoulder  Not tolerate NSAIDs due to GI side effect, worsening diarrhea/bowel incontinence    Marcial Pacas, M.D. Ph.D.  [facility] [address] [place], [place] [address] Ph: [phone] Fax: [phone]  CC: [name], MD

## 2017-02-11 ENCOUNTER — Telehealth: Payer: Self-pay | Admitting: Neurology

## 2017-02-11 NOTE — Telephone Encounter (Signed)
Spoke to patient - she is aware of x-ray results.  She will keep her appointments for MRI and NCV/EMG.

## 2017-02-11 NOTE — Telephone Encounter (Signed)
X-ray of left shoulder showed mild to moderate joint degenerative changes.  IMPRESSION: Mild to slightly moderate acromioclavicular joint degenerative changes.

## 2017-03-02 ENCOUNTER — Ambulatory Visit
Admission: RE | Admit: 2017-03-02 | Discharge: 2017-03-02 | Disposition: A | Discharge status: Home / Self Care | Payer: Medicare Other | Source: Ambulatory Visit | Attending: Neurology | Admitting: Neurology

## 2017-03-02 DIAGNOSIS — M542 Cervicalgia: Secondary | ICD-10-CM | POA: Diagnosis not present

## 2017-03-02 DIAGNOSIS — G8929 Other chronic pain: Secondary | ICD-10-CM

## 2017-03-02 DIAGNOSIS — M25512 Pain in left shoulder: Secondary | ICD-10-CM

## 2017-03-02 DIAGNOSIS — R202 Paresthesia of skin: Secondary | ICD-10-CM | POA: Diagnosis not present

## 2017-03-02 DIAGNOSIS — M4802 Spinal stenosis, cervical region: Secondary | ICD-10-CM | POA: Diagnosis not present

## 2017-03-03 ENCOUNTER — Telehealth: Payer: Self-pay | Admitting: Neurology

## 2017-03-03 MED ORDER — GABAPENTIN 100 MG PO CAPS: 300.0000 mg | ORAL_CAPSULE | Freq: Every day | ORAL | 6 refills | Status: DC

## 2017-03-03 NOTE — Telephone Encounter (Signed)
Spoke to patient - she is agreeable to try gabapentin.  She will start with 100mg  at bedtime, then slowly increase as tolerated to 200mg  at bedtime then eventually 300mg  at bedtime.  She takes her Celexa in the morning and will continue this routine.

## 2017-03-03 NOTE — Addendum Note (Signed)
Addended by: Marcial Pacas on: 03/03/2017 03:43 PM   Modules accepted: Orders

## 2017-03-03 NOTE — Telephone Encounter (Signed)
Please call patient MRI of the cervical spine showed multilevel degenerative changes, there was no evidence of cervical spinal cord compression    IMPRESSION:  This MRI of the cervical spine without contrast shows multilevel degenerative changes as detailed above. The most significant findings are: 1.   At C4-C5, there are degenerative changes causing moderate left and mild to moderate right foraminal narrowing. There does not appear to be any definite nerve root compression though there is some encroachment upon the left C5 nerve root. 2.   At C5-C6, there are degenerative changes causing mild bilateral foraminal narrowing but no nerve root compression. 3.   At C6-C7, there is a left lateral disc protrusion and other degenerative changes causing mild spinal stenosis and moderate left foraminal narrowing.   Although there is no definite nerve root compression, there is some encroachment upon the left C7 nerve root.

## 2017-03-03 NOTE — Telephone Encounter (Signed)
Spoke to patient - states nortriptyline 25mg  at bedtime is not helpful and 50mg  causes her excessive daytime drowsiness.  She is not able to continue taking it and is requesting an alternate medication.

## 2017-03-03 NOTE — Telephone Encounter (Signed)
Spoke to patient - she is aware of results and she will keep her pending appt for her NCV/EMG testing.

## 2017-03-03 NOTE — Telephone Encounter (Signed)
If nortriptyline does not help, she can go ahead stop it,  I have called in gabapentin 100 mg, she should take her Celebrex 20 mg every morning, gabapentin 1 tablet every night to begin with, make titrating to 3 tablets every night in one week if needed

## 2017-03-07 ENCOUNTER — Telehealth: Payer: Self-pay | Admitting: Neurology

## 2017-03-07 NOTE — Telephone Encounter (Signed)
I returned patient`s  call about upset stomach and diarrhoea after 1dose of gabapentin 2daysago. I suggested retry x 1 and if simar sideefects will need to stop and change to something else. shewas asked to call back on Monday and give Dr Krista Blue a update. She voiced understanding

## 2017-04-16 ENCOUNTER — Encounter (INDEPENDENT_AMBULATORY_CARE_PROVIDER_SITE_OTHER): Payer: Self-pay

## 2017-04-16 ENCOUNTER — Ambulatory Visit (INDEPENDENT_AMBULATORY_CARE_PROVIDER_SITE_OTHER): Payer: Medicare Other | Admitting: Neurology

## 2017-04-16 DIAGNOSIS — G8929 Other chronic pain: Secondary | ICD-10-CM | POA: Diagnosis not present

## 2017-04-16 DIAGNOSIS — M542 Cervicalgia: Secondary | ICD-10-CM | POA: Diagnosis not present

## 2017-04-16 DIAGNOSIS — M25512 Pain in left shoulder: Secondary | ICD-10-CM

## 2017-04-16 DIAGNOSIS — R202 Paresthesia of skin: Secondary | ICD-10-CM

## 2017-04-16 DIAGNOSIS — G5603 Carpal tunnel syndrome, bilateral upper limbs: Secondary | ICD-10-CM | POA: Insufficient documentation

## 2017-04-16 MED ORDER — TIZANIDINE HCL 4 MG PO TABS: 4.0000 mg | ORAL_TABLET | Freq: Three times a day (TID) | ORAL | 6 refills | Status: DC | PRN

## 2017-04-16 MED ORDER — MELOXICAM 15 MG PO TABS: 15.0000 mg | ORAL_TABLET | Freq: Every day | ORAL | 6 refills | Status: DC | PRN

## 2017-04-16 NOTE — Progress Notes (Signed)
PATIENT: Brenda Franklin DOB: [birth year]  No chief complaint on file.    HISTORICAL  Brenda Franklin is a 71 years old right-handed female, accompanied by her daughter [name], seen in refer by her primary care physician Dr. [name] for evaluation of bilateral upper extremity pain, initial evaluation was on Feb 10 2017.  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   She complains of bilateral arm deep achy pain since September 2017, left worse than right, limited range of motion of left shoulder, also complains of upper neck pain, radiating pain to occipital region,, headache, she denies gait abnormality, continued dealing with chronic bowel incontinence, has better controlling her bladder, denies bilateral lower extremity paresthesia, bilateral hands paresthesia  Laboratory evaluation in 2018, normal CMP creatinine of 0.8, LDL of 196, vitamin D was 71,    Update April 16 2017: She continue complains of significant left-sided neck pain radiating pain to left shoulder, left lateral arm, is not able to tolerating gabapentin 100 mg 3 tablets every night, which has helped her sleep,  We have personally reviewed MRI of cervical spine in June 2018, multilevel degenerative disc disease, most severe at C 6 7, there was a left lateral disc protrusion, degenerative changes, causing mild spinal stenosis, moderate left foraminal narrowing, with some encroachment upon left C7 nerve roots,  EMG nerve conduction study showed moderate bilateral carpal tunnel syndromes, there was no evidence of left cervical radiculopathy.  I have adjusted her medications, keep gabapentin, add on Mobic as needed, tizanidine as needed, refer her  to physical therapy.  X-ray of left shoulder in May 2018 showed mild to moderate acromioclavicular joint degenerative changes  REVIEW OF SYSTEMS: Full 14 system review of systems performed and notable only for years, as above   ALLERGIES: Allergies  Allergen Reactions  . Tolmetin Nausea And Vomiting  . Codeine Nausea And Vomiting and Other (See Comments)    Upset stomach  . Milk-Related Compounds Diarrhea  . Nsaids Diarrhea and Other (See Comments)    Stomach upset  . Oxycodone Nausea And Vomiting    HOME MEDICATIONS: Current Outpatient Prescriptions  Medication Sig Dispense Refill  . AMINO ACIDS PO Take by mouth. Takes 2-3 daily.    . citalopram (CELEXA) 20 MG tablet Take by mouth daily.     . diphenoxylate-atropine (LOMOTIL) 2.5-0.025 MG per tablet Take 1 tablet by mouth 2 (two) times daily as needed for diarrhea or loose stools.    . gabapentin (NEURONTIN) 100 MG capsule Take 3 capsules (300 mg total) by mouth at bedtime. 90 capsule 6  . lovastatin (MEVACOR) 40 MG tablet Take 40 mg by mouth at bedtime.    Marland Kitchen tiZANidine (ZANAFLEX) 4 MG tablet Take 4-6 mg by mouth at bedtime as needed (muscle relaxation).    Marland Kitchen UNABLE TO FIND Maxi-Zyme:  Takes 2-3 per day.    . Vitamin D, Ergocalciferol, (DRISDOL) 50000 units CAPS capsule Take 50,000 Units by mouth every 7 (seven) days.     No current facility-administered medications for this visit.     PAST MEDICAL HISTORY: Past Medical History:  Diagnosis Date  . Cataracts, bilateral   . Compression fracture of fifth lumbar vertebra ([place])   .  Depression   . Extremity pain   . High cholesterol     PAST SURGICAL HISTORY: Past Surgical History:  Procedure Laterality Date  . ABDOMINAL HYSTERECTOMY    . BREAST ENHANCEMENT SURGERY    . COLONOSCOPY    . ESOPHAGEAL DILATION    . EXCISION VAGINAL CYST    . HEMORRHOID SURGERY    . metal stimulator for bowel control     Removed  . NOSE SURGERY      FAMILY HISTORY: Family History    Problem Relation Age of Onset  . Other Mother        Intestinal problem - gangrene  . Cancer Father        Bile Duct Cancer  . Heart disease Father   . Heart attack Father   . Hypercholesterolemia Father   . Kidney cancer Sister   . Kidney cancer Brother     SOCIAL HISTORY:  Social History   Social History  . Marital status: Single    Spouse name: N/A  . Number of children: 4  . Years of education: 1 year of college   Occupational History  . Retired    Social History Main Topics  . Smoking status: Current Every Day Smoker    Packs/day: 0.75    Types: Cigarettes  . Smokeless tobacco: Never Used  . Alcohol use No  . Drug use: No  . Sexual activity: Not on file   Other Topics Concern  . Not on file   Social History Narrative   Lives at home with her daughter.   Right-handed.   2-3 cups caffeine daily.     PHYSICAL EXAM   There were no vitals filed for this visit.  Not recorded      There is no height or weight on file to calculate BMI.  PHYSICAL EXAMNIATION:  Gen: NAD, conversant, well nourised, obese, well groomed                     Cardiovascular: Regular rate rhythm, no peripheral edema, warm, nontender. Eyes: Conjunctivae clear without exudates or hemorrhage Neck: Supple, no carotid bruits. Pulmonary: Clear to auscultation bilaterally   NEUROLOGICAL EXAM:  MENTAL STATUS: Speech:    Speech is normal; fluent and spontaneous with normal comprehension.  Cognition:     Orientation to time, place and person     Normal recent and remote memory     Normal Attention span and concentration     Normal Language, naming, repeating,spontaneous speech     Fund of knowledge   CRANIAL NERVES: CN II: Visual fields are full to confrontation. Fundoscopic exam is normal with sharp discs and no vascular changes. Pupils are round equal and briskly reactive to light. CN III, IV, VI: extraocular movement are normal. No ptosis. CN V: Facial sensation is intact to  pinprick in all 3 divisions bilaterally. Corneal responses are intact.  CN VII: Face is symmetric with normal eye closure and smile. CN VIII: Hearing is normal to rubbing fingers CN IX, X: Palate elevates symmetrically. Phonation is normal. CN XI: Head turning and shoulder shrug are intact CN XII: Tongue is midline with normal movements and no atrophy.  MOTOR: There is no pronator drift of out-stretched arms. Muscle bulk and tone are normal. Muscle strength is normal. there was no significant tenderness upon left shoulder,  REFLEXES: Reflexes are 2+ and symmetric at the biceps, triceps, knees, and ankles. Plantar responses are flexor.  SENSORY: Intact to light touch, pinprick, positional  sensation and vibratory sensation are intact in fingers and toes.  COORDINATION: Rapid alternating movements and fine finger movements are intact. There is no dysmetria on finger-to-nose and heel-knee-shin.    GAIT/STANCE: Posture is normal. Gait is steady with normal steps, base, arm swing, and turning. Heel and toe walking are normal. Tandem gait is normal.  Romberg is absent.   DIAGNOSTIC DATA (LABS, IMAGING, TESTING) - I reviewed patient records, labs, notes, testing and imaging myself where available.   ASSESSMENT AND PLAN  Jacki Couse is a 70 y.o. female   Neck pain, radiating pain to bilateral upper extremity left worse than right  Most suggestive of left cervical radiculopathy, muscle spasm,  I have suggested heat compression, neck stretching exercise, refer her to physical therapy,  Continue gabapentin 300 mg every night  Mobic 15 mg as needed, tizanidine as needed  Marcial Pacas, M.D. Ph.D.  [facility] [address], [place], [place] [address] Ph: [phone] Fax: [phone]  CC: [name], MD

## 2017-04-16 NOTE — Procedures (Signed)
Full Name: Brenda Franklin Gender: Female MRN #: [id number] Date of Birth: Aug 26, 1946    Visit Date: 04/16/2017 11:01 Age: 71 Years 58 Months Old Examining Physician: Marcial Pacas, MD  Referring Physician: Krista Blue, MD History: 71 years old female complains of left-sided neck pain, radiating pain to left arm  Summary of the test: Nerve conduction study: Bilateral ulnar radial sensory and motor responses were normal.  Bilateral medial sensory response showed moderately prolonged peak latency was mildly decreased snap amplitude; bilateral median motor responses showed moderately prolonged distal latency, low normal to mildly decreased to C map amplitude, low normal range conduction velocity.  Electromyography: Selective needle examinations were performed at left upper extremity muscles and left cervical paraspinal muscles, there was no significant abnormality noted.  Conclusion: This is an abnormal study. 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 There is no evidence of left cervical radiculopathy.    ------------------------------- Marcial Pacas, M.D.  [facility] [place], [place] [address] Tel: [phone] Fax: [phone]        [facility]    Nerve / Sites Rec. Site Latency Ref. Amplitude Ref. Rel Amp Segments Distance Velocity Ref. Area    ms ms mV mV %  cm m/s m/s mVms  L Median - APB     Wrist APB 5.3 ?4.4 3.7 ?4.0 100 Wrist - APB 7   13.7     Upper arm APB 9.4  3.2  87.8 Upper arm - Wrist 20 49 ?49 10.7  R Median - APB     Wrist APB 5.3 ?4.4 4.4 ?4.0 100 Wrist - APB 7   17.1     Upper arm APB 9.3  4.4  101 Upper arm - Wrist 20 49 ?49 16.5  L Ulnar - ADM     Wrist ADM 2.8 ?3.3 5.5 ?6.0 100 Wrist - ADM 7   14.4     B.Elbow ADM 6.3  5.0  92.1 B.Elbow - Wrist 18 52 ?49 12.9     A.Elbow ADM 8.4  5.3  104 A.Elbow - B.Elbow 12 56 ?49 13.6   A.Elbow - Wrist      R Ulnar - ADM     Wrist ADM 2.8 ?3.3 7.7 ?6.0 100 Wrist - ADM 7   16.9     B.Elbow ADM 5.8  6.7  87.1 B.Elbow - Wrist 17 56 ?49 16.7     A.Elbow ADM 7.7  6.7  99 A.Elbow - B.Elbow 10 53 ?49 17.0         A.Elbow - Wrist      L Radial - EIP     Forearm EIP 1.9 ?2.9 2.7 ?2.0 100 Forearm - EIP 4  ?49 11.9     Elbow EIP 4.4  3.2  116 Elbow - Forearm 14 57  17.7     Spiral Gr EIP 6.2  3.0  95.6 Spiral Gr - Elbow 10 55  17.1     Axilla EIP 8.1  2.7  88.1 Axilla - Spiral Gr 10 52  6.9     Erb's Pt EIP 10.5  3.6  133 Erb's Pt - Axilla 14 58  17.2         Erb's Pt - Spiral Gr      R Radial - EIP     Forearm EIP 2.1 ?2.9 3.0 ?2.0 100 Forearm - EIP 4  ?49 16.8  Elbow EIP 4.6  3.0  100 Elbow - Forearm 14 56  15.1     Spiral Gr EIP 6.3  3.0  97.5 Spiral Gr - Elbow 10 60  15.5     Axilla EIP 7.9  2.3  77.7 Axilla - Spiral Gr 10 62  8.4     Erb's Pt EIP 10.2  3.6  156 Erb's Pt - Axilla 15 64  6.1         Erb's Pt - Spiral Gr                     SNC    Nerve / Sites Rec. Site Peak Lat Ref.  Amp Ref. Segments Distance    ms ms V V  cm  L Radial - Anatomical snuff box (Forearm)     Forearm Wrist 2.55 ?2.90 14 ?15 Forearm - Wrist 10  R Radial - Anatomical snuff box (Forearm)     Forearm Wrist 2.71 ?2.90 12 ?15 Forearm - Wrist 10  L Median - Orthodromic (Dig II, Mid palm)     Dig II Wrist 4.48 ?3.40 3 ?10 Dig II - Wrist 13  R Median - Orthodromic (Dig II, Mid palm)     Dig II Wrist 4.22 ?3.40 4 ?10 Dig II - Wrist 13  L Ulnar - Orthodromic, (Dig V, Mid palm)     Dig V Wrist 2.45 ?3.10 8 ?5 Dig V - Wrist 11  R Ulnar - Orthodromic, (Dig V, Mid palm)     Dig V Wrist 2.71 ?3.10 6 ?5 Dig V - Wrist 49                 F  Wave    Nerve F Lat Ref.   ms ms  L Ulnar - ADM 27.3 ?32.0  R Ulnar - ADM 28.2 ?32.0         EMG full       EMG Summary Table    Spontaneous MUAP Recruitment  Muscle IA Fib PSW Fasc Other Amp Dur. Poly Pattern  L. Pronator teres Normal None None None  _______ Normal Normal Normal Normal  L. Brachioradialis Normal None None None _______ Normal Normal Normal Normal  L. Biceps brachii Normal None None None _______ Normal Normal Normal Normal  L. Deltoid Normal None None None _______ Normal Normal Normal Normal  L. Triceps brachii Normal None None None _______ Normal Normal Normal Normal  R. Cervical paraspinals Normal None None None _______ Normal Normal Normal Normal

## 2017-05-02 DIAGNOSIS — N390 Urinary tract infection, site not specified: Secondary | ICD-10-CM | POA: Diagnosis not present

## 2017-05-02 DIAGNOSIS — M549 Dorsalgia, unspecified: Secondary | ICD-10-CM | POA: Diagnosis not present

## 2017-05-03 ENCOUNTER — Ambulatory Visit (HOSPITAL_COMMUNITY)
Admission: EM | Admit: 2017-05-03 | Discharge: 2017-05-03 | Disposition: A | Discharge status: Home / Self Care | Payer: Medicare Other | Attending: Internal Medicine | Admitting: Internal Medicine

## 2017-05-03 ENCOUNTER — Encounter (HOSPITAL_COMMUNITY): Payer: Self-pay | Admitting: Family Medicine

## 2017-05-03 DIAGNOSIS — Z8249 Family history of ischemic heart disease and other diseases of the circulatory system: Secondary | ICD-10-CM | POA: Insufficient documentation

## 2017-05-03 DIAGNOSIS — F17210 Nicotine dependence, cigarettes, uncomplicated: Secondary | ICD-10-CM | POA: Diagnosis not present

## 2017-05-03 DIAGNOSIS — Z885 Allergy status to narcotic agent status: Secondary | ICD-10-CM | POA: Insufficient documentation

## 2017-05-03 DIAGNOSIS — M545 Low back pain: Secondary | ICD-10-CM | POA: Diagnosis not present

## 2017-05-03 DIAGNOSIS — G8929 Other chronic pain: Secondary | ICD-10-CM

## 2017-05-03 DIAGNOSIS — Z9889 Other specified postprocedural states: Secondary | ICD-10-CM | POA: Insufficient documentation

## 2017-05-03 DIAGNOSIS — Z800 Family history of malignant neoplasm of digestive organs: Secondary | ICD-10-CM | POA: Insufficient documentation

## 2017-05-03 DIAGNOSIS — Z888 Allergy status to other drugs, medicaments and biological substances status: Secondary | ICD-10-CM | POA: Diagnosis not present

## 2017-05-03 DIAGNOSIS — Z90710 Acquired absence of both cervix and uterus: Secondary | ICD-10-CM | POA: Insufficient documentation

## 2017-05-03 DIAGNOSIS — Z841 Family history of disorders of kidney and ureter: Secondary | ICD-10-CM | POA: Diagnosis not present

## 2017-05-03 DIAGNOSIS — F329 Major depressive disorder, single episode, unspecified: Secondary | ICD-10-CM | POA: Insufficient documentation

## 2017-05-03 DIAGNOSIS — R109 Unspecified abdominal pain: Secondary | ICD-10-CM | POA: Insufficient documentation

## 2017-05-03 DIAGNOSIS — Z79899 Other long term (current) drug therapy: Secondary | ICD-10-CM | POA: Diagnosis not present

## 2017-05-03 DIAGNOSIS — K228 Other specified diseases of esophagus: Secondary | ICD-10-CM | POA: Insufficient documentation

## 2017-05-03 DIAGNOSIS — E7800 Pure hypercholesterolemia, unspecified: Secondary | ICD-10-CM | POA: Insufficient documentation

## 2017-05-03 DIAGNOSIS — R350 Frequency of micturition: Secondary | ICD-10-CM | POA: Diagnosis not present

## 2017-05-03 LAB — POCT URINALYSIS DIP (DEVICE)
Bilirubin Urine: NEGATIVE
Glucose, UA: NEGATIVE mg/dL
Hgb urine dipstick: NEGATIVE
Ketones, ur: NEGATIVE mg/dL
Leukocytes, UA: NEGATIVE
Nitrite: NEGATIVE
PH: 5.5 (ref 5.0–8.0)
PROTEIN: NEGATIVE mg/dL
SPECIFIC GRAVITY, URINE: 1.01 (ref 1.005–1.030)
Urobilinogen, UA: 0.2 mg/dL (ref 0.0–1.0)

## 2017-05-03 MED ORDER — SULFAMETHOXAZOLE-TRIMETHOPRIM 800-160 MG PO TABS: 1.0000 | ORAL_TABLET | Freq: Two times a day (BID) | ORAL | 0 refills | Status: AC

## 2017-05-03 MED ORDER — KETOROLAC TROMETHAMINE 30 MG/ML IJ SOLN: 30.0000 mg | Freq: Once | INTRAMUSCULAR | Status: DC

## 2017-05-03 MED ORDER — KETOROLAC TROMETHAMINE 30 MG/ML IJ SOLN
30.0000 mg | Freq: Once | INTRAMUSCULAR | Status: AC
  Administered 2017-05-03: 30 mg via INTRAMUSCULAR

## 2017-05-03 MED ORDER — KETOROLAC TROMETHAMINE 30 MG/ML IJ SOLN
INTRAMUSCULAR | Status: AC
  Filled 2017-05-03: qty 1

## 2017-05-03 NOTE — ED Triage Notes (Signed)
Pt here for bilateral flank pain, lower back pain. sts urine is foul smelling. sts also urine frequency.

## 2017-05-03 NOTE — Discharge Instructions (Signed)
Follow up with your neurologist. Resume muscle relaxant previously prescribed

## 2017-05-03 NOTE — ED Provider Notes (Signed)
CSN: [id number]     Arrival date & time 05/03/17  1210 History   First MD Initiated Contact with Patient 05/03/17 1259     Chief Complaint  Patient presents with  . Back Pain  . Flank Pain   (Consider location/radiation/quality/duration/timing/severity/associated sxs/prior Treatment) 71 y.o. female presents with bilateral lower back pain X 3 days Pain is persistent in nature. Patient describes the pain as "throbbing" that radiates around to her abdomen. Patient denies any trauma. Patient endorses increased urinary frequency and foul odor. Condition is acute in nature. Condition is made better by nothing. Condition is made worse by movement of hip. Patient denies any relief from tylenol prior to there arrival at this facility. 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 Patient denies any loss of bladder or bowel. No radiation of pain distally.       Past Medical History:  Diagnosis Date  . Cataracts, bilateral   . Compression fracture of fifth lumbar vertebra ([place])   . Depression   . Extremity pain   . High cholesterol    Past Surgical History:  Procedure Laterality Date  . ABDOMINAL HYSTERECTOMY    . BREAST ENHANCEMENT SURGERY    . COLONOSCOPY    . ESOPHAGEAL DILATION    . EXCISION VAGINAL CYST    . HEMORRHOID SURGERY    . metal stimulator for bowel control     Removed  . NOSE SURGERY     Family History  Problem Relation Age of Onset  . Other Mother        Intestinal problem - gangrene  . Cancer Father        Bile Duct Cancer  . Heart disease Father   . Heart attack Father   . Hypercholesterolemia Father   . Kidney cancer Sister   . Kidney cancer Brother    Social History  Substance Use Topics  . Smoking status: Current Every Day Smoker    Packs/day: 0.75    Types: Cigarettes  . Smokeless tobacco: Never Used  . Alcohol use No   OB History    No data available     Review of Systems  Constitutional: Negative for  chills and fever.  HENT: Negative for ear pain and sore throat.   Eyes: Negative for pain and visual disturbance.  Respiratory: Negative for cough and shortness of breath.   Cardiovascular: Negative for chest pain and palpitations.  Gastrointestinal: Negative for abdominal pain and vomiting.  Genitourinary: Positive for frequency. Negative for dysuria and hematuria.        Foul smell  Musculoskeletal: Positive for back pain ( lower back ). Negative for arthralgias.  Skin: Negative for color change and rash.  Neurological: Negative for seizures and syncope.  All other systems reviewed and are negative.   Allergies  Tolmetin; Codeine; Milk-related compounds; Nsaids; and Oxycodone  Home Medications   Prior to Admission medications   Medication Sig Start Date End Date Taking? Authorizing Provider  AMINO ACIDS PO Take by mouth. Takes 2-3 daily.    [provider]  citalopram (CELEXA) 20 MG tablet Take by mouth daily.     [provider]  diphenoxylate-atropine (LOMOTIL) 2.5-0.025 MG per tablet Take 1 tablet by mouth 2 (two) times daily as needed for diarrhea or loose stools.    [provider]  gabapentin (NEURONTIN) 100 MG capsule Take 3 capsules (300 mg total) by mouth at bedtime. 03/03/17   Marcial Pacas, MD  lovastatin (  MEVACOR) 40 MG tablet Take 40 mg by mouth at bedtime.    [provider]  meloxicam (MOBIC) 15 MG tablet Take 1 tablet (15 mg total) by mouth daily as needed for pain. 04/16/17   Marcial Pacas, MD  sulfamethoxazole-trimethoprim (BACTRIM DS,SEPTRA DS) 800-160 MG tablet Take 1 tablet by mouth 2 (two) times daily. 05/03/17 05/10/17  Jacqualine Mau, NP  tiZANidine (ZANAFLEX) 4 MG tablet Take 1-1.5 tablets (4-6 mg total) by mouth every 8 (eight) hours as needed (muscle relaxation). 04/16/17   Marcial Pacas, MD  UNABLE TO FIND Maxi-Zyme:  Takes 2-3 per day.    [provider]  Vitamin D, Ergocalciferol, (DRISDOL) 50000 units CAPS capsule Take  50,000 Units by mouth every 7 (seven) days.    [provider]   Meds Ordered and Administered this Visit   Medications  ketorolac (TORADOL) 30 MG/ML injection 30 mg (not administered)    BP (!) 145/76   Pulse 72   Temp 97.7 F (36.5 C)   Resp 18   SpO2 95%  No data found.   Physical Exam  Constitutional: She is oriented to person, place, and time. She appears well-developed and well-nourished.  HENT:  Head: Normocephalic and atraumatic.  Eyes: Conjunctivae are normal.  Neck: Normal range of motion.  Pulmonary/Chest: Effort normal.  Abdominal: There is no tenderness.  Hyperactive bowel sounds  Musculoskeletal: Normal range of motion. She exhibits no tenderness.  Neurological: She is alert and oriented to person, place, and time.  Skin: Skin is warm.  Psychiatric: She has a normal mood and affect.  Nursing note and vitals reviewed.   Urgent Care Course     Procedures (including critical care time)  Labs Review Labs Reviewed  URINE CULTURE  POCT URINALYSIS DIP (DEVICE)    Imaging Review No results found.         MDM   1. Chronic low back pain without sciatica, unspecified back pain laterality       Jacqualine Mau, NP 05/03/17 1354

## 2017-05-04 LAB — URINE CULTURE: Culture: <10000 — AB

## 2017-05-05 DIAGNOSIS — M5442 Lumbago with sciatica, left side: Secondary | ICD-10-CM | POA: Diagnosis not present

## 2017-05-05 DIAGNOSIS — M545 Low back pain: Secondary | ICD-10-CM | POA: Diagnosis not present

## 2017-05-05 DIAGNOSIS — M5136 Other intervertebral disc degeneration, lumbar region: Secondary | ICD-10-CM | POA: Diagnosis not present

## 2017-05-05 DIAGNOSIS — M5441 Lumbago with sciatica, right side: Secondary | ICD-10-CM | POA: Diagnosis not present

## 2017-05-06 DIAGNOSIS — M545 Low back pain: Secondary | ICD-10-CM | POA: Diagnosis not present

## 2017-05-10 DIAGNOSIS — M545 Low back pain: Secondary | ICD-10-CM | POA: Diagnosis not present

## 2017-05-10 DIAGNOSIS — S32040D Wedge compression fracture of fourth lumbar vertebra, subsequent encounter for fracture with routine healing: Secondary | ICD-10-CM | POA: Diagnosis not present

## 2017-05-27 DIAGNOSIS — S32040D Wedge compression fracture of fourth lumbar vertebra, subsequent encounter for fracture with routine healing: Secondary | ICD-10-CM | POA: Diagnosis not present

## 2017-05-27 DIAGNOSIS — M545 Low back pain: Secondary | ICD-10-CM | POA: Diagnosis not present

## 2017-06-23 DIAGNOSIS — M545 Low back pain: Secondary | ICD-10-CM | POA: Diagnosis not present

## 2017-06-23 DIAGNOSIS — S32040D Wedge compression fracture of fourth lumbar vertebra, subsequent encounter for fracture with routine healing: Secondary | ICD-10-CM | POA: Diagnosis not present

## 2017-07-17 DIAGNOSIS — E559 Vitamin D deficiency, unspecified: Secondary | ICD-10-CM | POA: Diagnosis not present

## 2017-07-17 DIAGNOSIS — F329 Major depressive disorder, single episode, unspecified: Secondary | ICD-10-CM | POA: Diagnosis not present

## 2017-07-17 DIAGNOSIS — E7800 Pure hypercholesterolemia, unspecified: Secondary | ICD-10-CM | POA: Diagnosis not present

## 2017-07-24 DIAGNOSIS — Z0000 Encounter for general adult medical examination without abnormal findings: Secondary | ICD-10-CM | POA: Diagnosis not present

## 2017-07-24 DIAGNOSIS — M25512 Pain in left shoulder: Secondary | ICD-10-CM | POA: Diagnosis not present

## 2017-07-24 DIAGNOSIS — R070 Pain in throat: Secondary | ICD-10-CM | POA: Diagnosis not present

## 2017-07-24 DIAGNOSIS — G8929 Other chronic pain: Secondary | ICD-10-CM | POA: Diagnosis not present

## 2017-07-24 DIAGNOSIS — E7800 Pure hypercholesterolemia, unspecified: Secondary | ICD-10-CM | POA: Diagnosis not present

## 2017-08-11 DIAGNOSIS — M62838 Other muscle spasm: Secondary | ICD-10-CM | POA: Diagnosis not present

## 2017-08-11 DIAGNOSIS — M7580 Other shoulder lesions, unspecified shoulder: Secondary | ICD-10-CM | POA: Diagnosis not present

## 2017-08-11 DIAGNOSIS — M25512 Pain in left shoulder: Secondary | ICD-10-CM | POA: Diagnosis not present

## 2017-08-11 DIAGNOSIS — M791 Myalgia, unspecified site: Secondary | ICD-10-CM | POA: Diagnosis not present

## 2017-08-11 DIAGNOSIS — Z780 Asymptomatic menopausal state: Secondary | ICD-10-CM | POA: Diagnosis not present

## 2017-08-11 DIAGNOSIS — M1990 Unspecified osteoarthritis, unspecified site: Secondary | ICD-10-CM | POA: Diagnosis not present

## 2017-08-26 DIAGNOSIS — R51 Headache: Secondary | ICD-10-CM | POA: Diagnosis not present

## 2017-08-26 DIAGNOSIS — J343 Hypertrophy of nasal turbinates: Secondary | ICD-10-CM | POA: Diagnosis not present

## 2017-08-26 DIAGNOSIS — J342 Deviated nasal septum: Secondary | ICD-10-CM | POA: Diagnosis not present

## 2017-08-26 DIAGNOSIS — R070 Pain in throat: Secondary | ICD-10-CM | POA: Diagnosis not present

## 2017-09-11 DIAGNOSIS — R05 Cough: Secondary | ICD-10-CM | POA: Diagnosis not present

## 2017-09-11 DIAGNOSIS — J069 Acute upper respiratory infection, unspecified: Secondary | ICD-10-CM | POA: Diagnosis not present

## 2017-09-18 DIAGNOSIS — J189 Pneumonia, unspecified organism: Secondary | ICD-10-CM | POA: Diagnosis not present

## 2017-10-10 ENCOUNTER — Other Ambulatory Visit (INDEPENDENT_AMBULATORY_CARE_PROVIDER_SITE_OTHER): Payer: Self-pay | Admitting: Otolaryngology

## 2017-10-10 DIAGNOSIS — J329 Chronic sinusitis, unspecified: Secondary | ICD-10-CM

## 2017-10-14 ENCOUNTER — Ambulatory Visit
Admission: RE | Admit: 2017-10-14 | Discharge: 2017-10-14 | Disposition: A | Discharge status: Home / Self Care | Payer: Medicare Other | Source: Ambulatory Visit | Attending: Otolaryngology | Admitting: Otolaryngology

## 2017-10-14 DIAGNOSIS — J329 Chronic sinusitis, unspecified: Secondary | ICD-10-CM | POA: Diagnosis not present

## 2017-10-16 ENCOUNTER — Other Ambulatory Visit: Payer: Self-pay | Admitting: Neurology

## 2017-10-30 DIAGNOSIS — J189 Pneumonia, unspecified organism: Secondary | ICD-10-CM | POA: Diagnosis not present

## 2017-11-04 DIAGNOSIS — J342 Deviated nasal septum: Secondary | ICD-10-CM | POA: Diagnosis not present

## 2017-11-04 DIAGNOSIS — J310 Chronic rhinitis: Secondary | ICD-10-CM | POA: Diagnosis not present

## 2017-11-04 DIAGNOSIS — J343 Hypertrophy of nasal turbinates: Secondary | ICD-10-CM | POA: Diagnosis not present

## 2017-12-04 ENCOUNTER — Telehealth: Payer: Self-pay | Admitting: Neurology

## 2017-12-04 NOTE — Telephone Encounter (Signed)
Pts daughter call stating the pt has been sleeping a lot, loosing her hair and very depressed pt is currently taking citalopram (CELEXA) 20 MG tablet but isnt helping with this. Daughter requesting a call back to discuss all the symptoms in a more detailed way

## 2017-12-04 NOTE — Telephone Encounter (Signed)
I called [name], pt's daughter, per DPR. She reports that pt is taking gabapentin 300mg  at bedtime. The gabapentin seems to be making pt lose her hair, makes her feel tired, and depressed. Pt "sleeps all the time." Pt does take her celexa daily. These symptoms have gradually gotten worse since starting gabapentin. The gabapentin seems to help pt's arm pain, however. Pt's daughter is wondering if there is another medication that pt can try instead. Pt's daughter says that they tried to make an appt with Dr. Krista Blue but she is booked out until June. I offered pt's daughter an appt with the NP to discuss medication management on 12/08/17 at 2:30pm. Pt's daughter is agreeable to this. Will send to Jinny Blossom, NP and Dr. Krista Blue for review of this plan and discussion on options for this pt.

## 2017-12-08 ENCOUNTER — Encounter: Payer: Self-pay | Admitting: Adult Health

## 2017-12-08 ENCOUNTER — Ambulatory Visit (INDEPENDENT_AMBULATORY_CARE_PROVIDER_SITE_OTHER): Payer: Medicare Other | Admitting: Adult Health

## 2017-12-08 VITALS — BP 133/68 | HR 72 | Wt 146.0 lb

## 2017-12-08 DIAGNOSIS — G8929 Other chronic pain: Secondary | ICD-10-CM

## 2017-12-08 DIAGNOSIS — T887XXA Unspecified adverse effect of drug or medicament, initial encounter: Secondary | ICD-10-CM

## 2017-12-08 DIAGNOSIS — M25512 Pain in left shoulder: Secondary | ICD-10-CM | POA: Diagnosis not present

## 2017-12-08 NOTE — Progress Notes (Signed)
PATIENT: Brenda Franklin DOB: 1946/07/05  REASON FOR VISIT: follow up HISTORY FROM: patient  HISTORY OF PRESENT ILLNESS: Today 12/08/17 Brenda Franklin is a 72 year old female with a history of pain in the left upper extremity as well as paresthesias.  She returns today for follow-up.  The patient is primarily concerned with her medication gabapentin.  She states that it makes her very sleepy.  She reports that she has no desire to participate in activities.  She also states that she has noticed an increase in hair loss.  She states that gabapentin has helped some with her discomfort as well as with constipation.  She states that she has tried decreasing her dose of gabapentin but the discomfort is worse.  She states that she has pain primarily in the left upper extremity that she describes as a soreness or dull ache.  She reports occasionally she will have sharp shooting pains behind the left shoulder blade.  She does also note that she was having hair loss 2 years ago but notes that in the last couple of months it seems to increase.  She returns today for an evaluation.  HISTORY 02/10/17: Brenda Franklin is a 72 years old right-handed female, accompanied by her daughter Brenda Franklin, seen in refer by her primary care physician Dr. [name] for evaluation of bilateral upper extremity pain, initial evaluation was on Feb 10 2017.  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   She complains of bilateral arm deep achy pain since September 2017, left worse than right, limited range of motion of left shoulder, also complains of upper neck pain, radiating pain to occipital region,, headache, she denies gait abnormality, continued  dealing with chronic bowel incontinence, has better controlling her bladder, denies bilateral lower extremity paresthesia, bilateral hands paresthesia  Laboratory evaluation in 2018, normal CMP creatinine of 0.8, LDL of 196, vitamin D was 59,    REVIEW OF SYSTEMS: Out of a complete 14 system review of symptoms, the patient complains only of the following symptoms, and all other reviewed systems are negative.  See HPI  ALLERGIES: Allergies  Allergen Reactions  . Tolmetin Nausea And Vomiting  . Codeine Nausea And Vomiting and Other (See Comments)    Upset stomach  . Milk-Related Compounds Diarrhea  . Nsaids Diarrhea and Other (See Comments)    Stomach upset  . Oxycodone Nausea And Vomiting    HOME MEDICATIONS: Outpatient Medications Prior to Visit  Medication Sig Dispense Refill  . AMINO ACIDS PO Take by mouth. Takes 2-3 daily.    . citalopram (CELEXA) 20 MG tablet Take by mouth daily.     . diphenoxylate-atropine (LOMOTIL) 2.5-0.025 MG per tablet Take 1 tablet by mouth 2 (two) times daily as needed for diarrhea or loose stools.    . gabapentin (NEURONTIN) 100 MG capsule Take 3 caps at bedtime.  Please call [phone] to schedule an appt for continued refills. 90 capsule 2  . lovastatin (MEVACOR) 40 MG tablet Take 40 mg by mouth at bedtime.    . meloxicam (MOBIC) 15 MG tablet Take 1 tablet (15 mg total) by mouth daily as needed for pain. 30 tablet 6  . tiZANidine (ZANAFLEX) 4 MG tablet Take 1-1.5  tablets (4-6 mg total) by mouth every 8 (eight) hours as needed (muscle relaxation). 60 tablet 6  . UNABLE TO FIND Maxi-Zyme:  Takes 2-3 per day.    . Vitamin D, Ergocalciferol, (DRISDOL) 50000 units CAPS capsule Take 50,000 Units by mouth every 7 (seven) days.     No facility-administered medications prior to visit.     PAST MEDICAL HISTORY: Past Medical History:  Diagnosis Date  . Cataracts, bilateral   . Compression fracture of fifth lumbar vertebra ([place])   . Depression   .  Extremity pain   . High cholesterol     PAST SURGICAL HISTORY: Past Surgical History:  Procedure Laterality Date  . ABDOMINAL HYSTERECTOMY    . BREAST ENHANCEMENT SURGERY    . COLONOSCOPY    . ESOPHAGEAL DILATION    . EXCISION VAGINAL CYST    . HEMORRHOID SURGERY    . metal stimulator for bowel control     Removed  . NOSE SURGERY      FAMILY HISTORY: Family History  Problem Relation Age of Onset  . Other Mother        Intestinal problem - gangrene  . Cancer Father        Bile Duct Cancer  . Heart disease Father   . Heart attack Father   . Hypercholesterolemia Father   . Kidney cancer Sister   . Kidney cancer Brother     SOCIAL HISTORY: Social History   Socioeconomic History  . Marital status: Single    Spouse name: Not on file  . Number of children: 4  . Years of education: 1 year of college  . Highest education level: Not on file  Social Needs  . Financial resource strain: Not on file  . Food insecurity - worry: Not on file  . Food insecurity - inability: Not on file  . Transportation needs - medical: Not on file  . Transportation needs - non-medical: Not on file  Occupational History  . Occupation: Retired  Tobacco Use  . Smoking status: Current Every Day Smoker    Packs/day: 0.75    Types: Cigarettes  . Smokeless tobacco: Never Used  Substance and Sexual Activity  . Alcohol use: No  . Drug use: No  . Sexual activity: Not on file  Other Topics Concern  . Not on file  Social History Narrative   Lives at home with her daughter.   Right-handed.   2-3 cups caffeine daily.      PHYSICAL EXAM  Vitals:   12/08/17 1437  BP: 133/68  Pulse: 72  Weight: 146 lb (66.2 kg)   Body mass index is 24.87 kg/m.  Generalized: Well developed, in no acute distress   Neurological examination  Mentation: Alert oriented to time, place, history taking. Follows all commands speech and language fluent Cranial nerve II-XII: Pupils were equal round reactive to  light. Extraocular movements were full, visual field were full on confrontational test. Facial sensation and strength were normal. Uvula tongue midline. Head turning and shoulder shrug  were normal and symmetric. Motor: The motor testing reveals 5 over 5 strength of all 4 extremities. Good symmetric motor tone is noted throughout.  Sensory: Sensory testing is intact to soft touch on all 4 extremities. No evidence of extinction is noted.  Coordination: Cerebellar testing reveals good finger-nose-finger and heel-to-shin bilaterally.  Gait and station: Gait is normal. Tandem gait is normal. Romberg is negative. No drift is seen.  Reflexes: Deep tendon reflexes are symmetric and normal bilaterally.  DIAGNOSTIC DATA (LABS, IMAGING, TESTING) - I reviewed patient records, labs, notes, testing and imaging myself where available.  Lab Results  Component Value Date   WBC 5.3 06/30/2013   HGB 14.0 06/30/2013   HCT 42.6 06/30/2013   MCV 88.8 06/30/2013   PLT 183 06/30/2013      Component Value Date/Time   NA 139 06/30/2013 1230   K 4.3 06/30/2013 1230   CL 103 06/30/2013 1230   CO2 25 06/30/2013 1230   GLUCOSE 95 06/30/2013 1230   BUN 13 06/30/2013 1230   CREATININE 0.75 06/30/2013 1230   CALCIUM 8.7 06/30/2013 1230   PROT 6.8 06/30/2013 1230   ALBUMIN 4.0 06/30/2013 1230   AST 20 06/30/2013 1230   ALT 16 06/30/2013 1230   ALKPHOS 64 06/30/2013 1230   BILITOT 0.5 06/30/2013 1230   GFRNONAA 84 (L) 06/30/2013 1230   GFRAA >90 06/30/2013 1230     ASSESSMENT AND PLAN 72 y.o. year old female  has a past medical history of Cataracts, bilateral, Compression fracture of fifth lumbar vertebra ([place]), Depression, Extremity pain, and High cholesterol. here with:   1.  Chronic left shoulder pain/upper extremity pain 2.  Medication reaction  It seems unlikely that gabapentin is causing the hair loss considering that she was reporting her last 2 years ago.  However it can cause drowsiness as well  as worsening depression.  I advised patient that we should wean off of gabapentin to see if the side effects subside.  She will decrease by 1 tablet every week until she has discontinued the medication.  She does have tizanidine available if she needs it.  Advised that she can take 1 tablet every 8 hours for breakthrough pain.  I did caution the patient that tizanidine can also cause drowsiness.  She is advised that if her symptoms worsen or she develops new symptoms she should let us know.  She will follow-up in 6 months or sooner if needed.    Ward Givens, MSN, NP-C 12/08/2017, 2:37 PM [facility] [address], [place], [place] [address] [phone]

## 2017-12-08 NOTE — Patient Instructions (Signed)
Your Plan:   Decrease gabapentin by 1 tablet each week until off medication Can use Tizanidine of needed- 1 tablet every 8 hours.  If your symptoms worsen or you develop new symptoms please let us know.   Thank you for coming to see [place] at [facility]. I hope we have been able to provide you high quality care today.  You may receive a patient satisfaction survey over the next few weeks. We would appreciate your feedback and comments so that we may continue to improve ourselves and the health of our patients.

## 2017-12-09 NOTE — Progress Notes (Signed)
I have reviewed and agreed above plan. 

## 2018-01-01 DIAGNOSIS — E7800 Pure hypercholesterolemia, unspecified: Secondary | ICD-10-CM | POA: Diagnosis not present

## 2018-01-08 DIAGNOSIS — Z0000 Encounter for general adult medical examination without abnormal findings: Secondary | ICD-10-CM | POA: Diagnosis not present

## 2018-01-08 DIAGNOSIS — R9389 Abnormal findings on diagnostic imaging of other specified body structures: Secondary | ICD-10-CM | POA: Diagnosis not present

## 2018-01-08 DIAGNOSIS — E7800 Pure hypercholesterolemia, unspecified: Secondary | ICD-10-CM | POA: Diagnosis not present

## 2018-01-08 DIAGNOSIS — J181 Lobar pneumonia, unspecified organism: Secondary | ICD-10-CM | POA: Diagnosis not present

## 2018-01-08 DIAGNOSIS — G4700 Insomnia, unspecified: Secondary | ICD-10-CM | POA: Diagnosis not present

## 2018-02-05 DIAGNOSIS — G4700 Insomnia, unspecified: Secondary | ICD-10-CM | POA: Diagnosis not present

## 2018-02-05 DIAGNOSIS — E7800 Pure hypercholesterolemia, unspecified: Secondary | ICD-10-CM | POA: Diagnosis not present

## 2018-02-05 DIAGNOSIS — F329 Major depressive disorder, single episode, unspecified: Secondary | ICD-10-CM | POA: Diagnosis not present

## 2018-02-05 DIAGNOSIS — M25559 Pain in unspecified hip: Secondary | ICD-10-CM | POA: Diagnosis not present

## 2018-02-06 DIAGNOSIS — E7800 Pure hypercholesterolemia, unspecified: Secondary | ICD-10-CM | POA: Diagnosis not present

## 2018-02-06 DIAGNOSIS — I10 Essential (primary) hypertension: Secondary | ICD-10-CM | POA: Diagnosis not present

## 2018-02-06 DIAGNOSIS — M542 Cervicalgia: Secondary | ICD-10-CM | POA: Diagnosis not present

## 2018-02-20 DIAGNOSIS — Z1231 Encounter for screening mammogram for malignant neoplasm of breast: Secondary | ICD-10-CM | POA: Diagnosis not present

## 2018-02-27 DIAGNOSIS — F329 Major depressive disorder, single episode, unspecified: Secondary | ICD-10-CM | POA: Diagnosis not present

## 2018-03-12 ENCOUNTER — Ambulatory Visit: Payer: Medicare Other | Admitting: Neurology

## 2018-03-23 DIAGNOSIS — M545 Low back pain: Secondary | ICD-10-CM | POA: Diagnosis not present

## 2018-03-23 DIAGNOSIS — M25551 Pain in right hip: Secondary | ICD-10-CM | POA: Diagnosis not present

## 2018-03-30 DIAGNOSIS — F329 Major depressive disorder, single episode, unspecified: Secondary | ICD-10-CM | POA: Diagnosis not present

## 2018-05-01 ENCOUNTER — Encounter (HOSPITAL_COMMUNITY): Payer: Self-pay

## 2018-05-01 ENCOUNTER — Other Ambulatory Visit: Payer: Self-pay

## 2018-05-01 ENCOUNTER — Ambulatory Visit (INDEPENDENT_AMBULATORY_CARE_PROVIDER_SITE_OTHER): Payer: Medicare Other

## 2018-05-01 ENCOUNTER — Ambulatory Visit (HOSPITAL_COMMUNITY)
Admission: EM | Admit: 2018-05-01 | Discharge: 2018-05-01 | Disposition: A | Discharge status: Home / Self Care | Payer: Medicare Other | Attending: Internal Medicine | Admitting: Internal Medicine

## 2018-05-01 DIAGNOSIS — R0981 Nasal congestion: Secondary | ICD-10-CM | POA: Diagnosis present

## 2018-05-01 DIAGNOSIS — R05 Cough: Secondary | ICD-10-CM | POA: Diagnosis not present

## 2018-05-01 DIAGNOSIS — J069 Acute upper respiratory infection, unspecified: Secondary | ICD-10-CM | POA: Insufficient documentation

## 2018-05-01 DIAGNOSIS — R42 Dizziness and giddiness: Secondary | ICD-10-CM

## 2018-05-01 DIAGNOSIS — R0789 Other chest pain: Secondary | ICD-10-CM

## 2018-05-01 DIAGNOSIS — F17210 Nicotine dependence, cigarettes, uncomplicated: Secondary | ICD-10-CM | POA: Insufficient documentation

## 2018-05-01 DIAGNOSIS — Z79899 Other long term (current) drug therapy: Secondary | ICD-10-CM | POA: Insufficient documentation

## 2018-05-01 LAB — POCT URINALYSIS DIP (DEVICE)
Bilirubin Urine: NEGATIVE
GLUCOSE, UA: NEGATIVE mg/dL
Hgb urine dipstick: NEGATIVE
KETONES UR: NEGATIVE mg/dL
Nitrite: NEGATIVE
PROTEIN: NEGATIVE mg/dL
Specific Gravity, Urine: 1.015 (ref 1.005–1.030)
UROBILINOGEN UA: 0.2 mg/dL (ref 0.0–1.0)
pH: 5.5 (ref 5.0–8.0)

## 2018-05-01 MED ORDER — FLUTICASONE PROPIONATE 50 MCG/ACT NA SUSP: 1.0000 | Freq: Every day | NASAL | 2 refills | Status: DC

## 2018-05-01 MED ORDER — ALBUTEROL SULFATE HFA 108 (90 BASE) MCG/ACT IN AERS: 1.0000 | INHALATION_SPRAY | Freq: Four times a day (QID) | RESPIRATORY_TRACT | 0 refills | Status: DC | PRN

## 2018-05-01 MED ORDER — GUAIFENESIN ER 600 MG PO TB12: 600.0000 mg | ORAL_TABLET | Freq: Two times a day (BID) | ORAL | 0 refills | Status: AC

## 2018-05-01 NOTE — ED Provider Notes (Signed)
[facility]    CSN: [id number] Arrival date & time: 05/01/18  1402     History   Chief Complaint Chief Complaint  Patient presents with  . Nasal Congestion    HPI Wandalee Klang is a 72 y.o. female.   Lorelie presents with complaints of chest tightness "like bronchitis" as well as cough which started last night. States throat feels itchy as well as ears. Worse today. No chest pain. States feels slightly light headed, like her "head is swimming." No dizziness. No fall or head injury. No weakness. She resides with her daughter. Denies shortness of breath. No nausea but has not eaten today. Decreased appetite. No known fever, states she felt it may have been elevated this morning. No pain or frequency with urination but states she has had an odor to her urine. Her daughter had URI illness last week. No rash.  Hx of depression, high cholesterol, shoulder pain.     ROS per HPI.      Past Medical History:  Diagnosis Date  . Cataracts, bilateral   . Compression fracture of fifth lumbar vertebra ([place])   . Depression   . Extremity pain   . High cholesterol     Patient Active Problem List   Diagnosis Date Noted  . Bilateral carpal tunnel syndrome 04/16/2017  . Neck pain 02/10/2017  . Paresthesia 02/10/2017  . Left shoulder pain 02/10/2017    Past Surgical History:  Procedure Laterality Date  . ABDOMINAL HYSTERECTOMY    . BREAST ENHANCEMENT SURGERY    . COLONOSCOPY    . ESOPHAGEAL DILATION    . EXCISION VAGINAL CYST    . HEMORRHOID SURGERY    . metal stimulator for bowel control     Removed  . NOSE SURGERY      OB History   None      Home Medications    Prior to Admission medications   Medication Sig Start Date End Date Taking? Authorizing Provider  escitalopram (LEXAPRO) 20 MG tablet Take 20 mg by mouth daily.   Yes [provider]  simvastatin (ZOCOR) 20 MG tablet Take 20 mg by mouth daily.   Yes [provider]  zolpidem (AMBIEN) 5  MG tablet Take 5 mg by mouth at bedtime as needed for sleep.   Yes [provider]  albuterol (PROVENTIL HFA;VENTOLIN HFA) 108 (90 Base) MCG/ACT inhaler Inhale 1-2 puffs into the lungs every 6 (six) hours as needed for wheezing or shortness of breath. 05/01/18   Zigmund Gottron, NP  fluticasone (FLONASE) 50 MCG/ACT nasal spray Place 1 spray into both nostrils daily. 05/01/18   Zigmund Gottron, NP  guaiFENesin (MUCINEX) 600 MG 12 hr tablet Take 1 tablet (600 mg total) by mouth 2 (two) times daily for 5 days. 05/01/18 05/06/18  Zigmund Gottron, NP    Family History Family History  Problem Relation Age of Onset  . Other Mother        Intestinal problem - gangrene  . Cancer Father        Bile Duct Cancer  . Heart disease Father   . Heart attack Father   . Hypercholesterolemia Father   . Kidney cancer Sister   . Kidney cancer Brother     Social History Social History   Tobacco Use  . Smoking status: Current Every Day Smoker    Packs/day: 0.75    Types: Cigarettes  . Smokeless tobacco: Never Used  Substance Use Topics  . Alcohol use: No  .  Drug use: No     Allergies   Tolmetin; Codeine; Milk-related compounds; Nsaids; and Oxycodone   Review of Systems Review of Systems   Physical Exam Triage Vital Signs ED Triage Vitals  Enc Vitals Group     BP 05/01/18 1415 (!) 147/79     Pulse Rate 05/01/18 1415 69     Resp 05/01/18 1415 18     Temp 05/01/18 1415 98.1 F (36.7 C)     Temp Source 05/01/18 1415 Oral     SpO2 05/01/18 1415 98 %     Weight 05/01/18 1418 148 lb (67.1 kg)     Height --      Head Circumference --      Peak Flow --      Pain Score 05/01/18 1417 0     Pain Loc --      Pain Edu? --      Excl. in [place]? --    No data found.  Updated Vital Signs BP (!) 147/79   Pulse 69   Temp 98.1 F (36.7 C) (Oral)   Resp 18   Wt 148 lb (67.1 kg)   SpO2 98%   BMI 25.21 kg/m    Physical Exam  Constitutional: She is oriented to person, place, and time.  She appears well-developed and well-nourished. No distress.  HENT:  Head: Normocephalic and atraumatic.  Right Ear: External ear normal. Tympanic membrane is erythematous.  Left Ear: Tympanic membrane, external ear and ear canal normal.  Generalized hard of hearing noted with exam   Eyes: Pupils are equal, round, and reactive to light. Conjunctivae and EOM are normal.  Neck: Normal range of motion.  Cardiovascular: Normal rate, regular rhythm and normal heart sounds.  Pulmonary/Chest: Effort normal and breath sounds normal.  Occasional congested cough noted   Neurological: She is alert and oriented to person, place, and time. No cranial nerve deficit or sensory deficit. Coordination normal.  Patient without any apparent confusion, with what sounds to be normal recall of events; ambulatory without any difficulty  Skin: Skin is warm and dry.  Psychiatric: She has a normal mood and affect.  Answering questions appropriately, hard of hearing noted   EKG NSR rate 70 without acute changes noted.   UC Treatments / Results  Labs (all labs ordered are listed, but only abnormal results are displayed) Labs Reviewed  POCT URINALYSIS DIP (DEVICE) - Abnormal; Notable for the following components:      Result Value   Leukocytes, UA TRACE (*)    All other components within normal limits  URINE CULTURE    EKG None  Radiology Dg Chest 2 View  Result Date: 05/01/2018 CLINICAL DATA:  Cough and chest tightness EXAM: CHEST - 2 VIEW COMPARISON:  01/08/2018 FINDINGS: Normal heart size when accounting for apical fat pad. Normal mediastinal and hilar contours. Calcified granuloma in the right mid lung. Minimal scar-like density at the bases that is similar to prior. Scoliosis. Chronic appearing L1 superior endplate fracture. IMPRESSION: No acute finding when compared to prior. Electronically Signed   By: Monte Fantasia M.D.   On: 05/01/2018 15:23    Procedures Procedures (including critical care  time)  Medications Ordered in UC Medications - No data to display  Initial Impression / Assessment and Plan / UC Course  I have reviewed the triage vital signs and the nursing notes.  Pertinent labs & imaging results that were available during my care of the patient were reviewed by me and considered in  my medical decision making (see chart for details).     Vitals stable at this time. ekg reassuring. Afebrile. No tachycardia or tachypnea. No shortness of breath or increased work of breathing. Chest xray without acute findings and UA without indication of UTI. Culture pending as leuks were presents. History and physical consistent with viral illness.  Supportive cares recommended. Return precautions provided and discussed at length. Patient verbalized understanding and agreeable to plan.  Ambulatory out of clinic without difficulty.    Final Clinical Impressions(s) / UC Diagnoses   Final diagnoses:  Viral upper respiratory tract infection     Discharge Instructions     Chest xray is normal today.  Your ekg is normal. I have sent your urine to be cultured as it does not appear to have any infection today, we would call you if that returns positive. This appears likely viral in nature.  Tylenol and/or ibuprofen as needed for pain or fevers.   Albuterol inhaler may help with tightness sensation and cough.  Daily flonase to help with congestion.  Mucinex twice a day as an expectorant.   Please make an appointment with your primary care provider for recheck next week.  If you worsen, develop increased pain, shortness of breath , difficulty  breathing, fevers, dehydration, dizziness, or otherwise worsening please go to the Er.     ED Prescriptions    Medication Sig Dispense Auth. Provider   albuterol (PROVENTIL HFA;VENTOLIN HFA) 108 (90 Base) MCG/ACT inhaler Inhale 1-2 puffs into the lungs every 6 (six) hours as needed for wheezing or shortness of breath. 1 Inhaler Augusto Gamble B,  NP   guaiFENesin (MUCINEX) 600 MG 12 hr tablet Take 1 tablet (600 mg total) by mouth 2 (two) times daily for 5 days. 10 tablet Augusto Gamble B, NP   fluticasone (FLONASE) 50 MCG/ACT nasal spray Place 1 spray into both nostrils daily. 16 g Zigmund Gottron, NP     Controlled Substance Prescriptions [place] Controlled Substance Registry consulted? Not Applicable   Zigmund Gottron, NP 05/01/18 1547

## 2018-05-01 NOTE — Discharge Instructions (Signed)
Chest xray is normal today.  Your ekg is normal. I have sent your urine to be cultured as it does not appear to have any infection today, we would call you if that returns positive. This appears likely viral in nature.  Tylenol and/or ibuprofen as needed for pain or fevers.   Albuterol inhaler may help with tightness sensation and cough.  Daily flonase to help with congestion.  Mucinex twice a day as an expectorant.   Please make an appointment with your primary care provider for recheck next week.  If you worsen, develop increased pain, shortness of breath , difficulty  breathing, fevers, dehydration, dizziness, or otherwise worsening please go to the Er.

## 2018-05-01 NOTE — ED Triage Notes (Signed)
SOB this started yesterday. Throat is icthy

## 2018-05-02 LAB — URINE CULTURE

## 2018-06-10 ENCOUNTER — Ambulatory Visit: Payer: Medicare Other | Admitting: Neurology

## 2018-07-20 DIAGNOSIS — E7800 Pure hypercholesterolemia, unspecified: Secondary | ICD-10-CM | POA: Diagnosis not present

## 2018-07-28 DIAGNOSIS — M25559 Pain in unspecified hip: Secondary | ICD-10-CM | POA: Diagnosis not present

## 2018-07-28 DIAGNOSIS — G4700 Insomnia, unspecified: Secondary | ICD-10-CM | POA: Diagnosis not present

## 2018-07-28 DIAGNOSIS — M8588 Other specified disorders of bone density and structure, other site: Secondary | ICD-10-CM | POA: Diagnosis not present

## 2018-07-28 DIAGNOSIS — E7800 Pure hypercholesterolemia, unspecified: Secondary | ICD-10-CM | POA: Diagnosis not present

## 2018-07-28 DIAGNOSIS — M5442 Lumbago with sciatica, left side: Secondary | ICD-10-CM | POA: Diagnosis not present

## 2018-07-28 DIAGNOSIS — M5441 Lumbago with sciatica, right side: Secondary | ICD-10-CM | POA: Diagnosis not present

## 2018-07-28 DIAGNOSIS — Z0000 Encounter for general adult medical examination without abnormal findings: Secondary | ICD-10-CM | POA: Diagnosis not present

## 2018-07-28 DIAGNOSIS — G5600 Carpal tunnel syndrome, unspecified upper limb: Secondary | ICD-10-CM | POA: Diagnosis not present

## 2018-07-28 DIAGNOSIS — F329 Major depressive disorder, single episode, unspecified: Secondary | ICD-10-CM | POA: Diagnosis not present

## 2018-07-28 DIAGNOSIS — M8580 Other specified disorders of bone density and structure, unspecified site: Secondary | ICD-10-CM | POA: Diagnosis not present

## 2018-09-01 DIAGNOSIS — E7800 Pure hypercholesterolemia, unspecified: Secondary | ICD-10-CM | POA: Diagnosis not present

## 2018-09-08 DIAGNOSIS — G4700 Insomnia, unspecified: Secondary | ICD-10-CM | POA: Diagnosis not present

## 2018-09-08 DIAGNOSIS — F329 Major depressive disorder, single episode, unspecified: Secondary | ICD-10-CM | POA: Diagnosis not present

## 2018-09-08 DIAGNOSIS — E7800 Pure hypercholesterolemia, unspecified: Secondary | ICD-10-CM | POA: Diagnosis not present

## 2018-10-06 DIAGNOSIS — M545 Low back pain: Secondary | ICD-10-CM | POA: Diagnosis not present

## 2018-10-06 DIAGNOSIS — M25551 Pain in right hip: Secondary | ICD-10-CM | POA: Diagnosis not present

## 2019-04-13 ENCOUNTER — Encounter: Payer: Self-pay | Admitting: *Deleted

## 2019-08-13 ENCOUNTER — Other Ambulatory Visit: Payer: Self-pay | Admitting: Internal Medicine

## 2019-08-13 DIAGNOSIS — H8110 Benign paroxysmal vertigo, unspecified ear: Secondary | ICD-10-CM

## 2019-08-23 ENCOUNTER — Ambulatory Visit: Payer: Medicare Other | Attending: Internal Medicine | Admitting: Physical Therapy

## 2019-08-23 ENCOUNTER — Other Ambulatory Visit: Payer: Self-pay

## 2019-08-23 DIAGNOSIS — R262 Difficulty in walking, not elsewhere classified: Secondary | ICD-10-CM | POA: Diagnosis not present

## 2019-08-23 DIAGNOSIS — R42 Dizziness and giddiness: Secondary | ICD-10-CM

## 2019-08-23 DIAGNOSIS — R2681 Unsteadiness on feet: Secondary | ICD-10-CM | POA: Diagnosis not present

## 2019-08-23 DIAGNOSIS — H8112 Benign paroxysmal vertigo, left ear: Secondary | ICD-10-CM | POA: Diagnosis not present

## 2019-08-24 ENCOUNTER — Other Ambulatory Visit: Payer: Self-pay

## 2019-08-24 ENCOUNTER — Ambulatory Visit
Admission: RE | Admit: 2019-08-24 | Discharge: 2019-08-24 | Disposition: A | Discharge status: Home / Self Care | Payer: Medicare Other | Source: Ambulatory Visit | Attending: Internal Medicine | Admitting: Internal Medicine

## 2019-08-24 DIAGNOSIS — H8110 Benign paroxysmal vertigo, unspecified ear: Secondary | ICD-10-CM

## 2019-08-24 DIAGNOSIS — R42 Dizziness and giddiness: Secondary | ICD-10-CM | POA: Diagnosis not present

## 2019-08-24 DIAGNOSIS — R410 Disorientation, unspecified: Secondary | ICD-10-CM | POA: Diagnosis not present

## 2019-08-24 NOTE — Therapy (Signed)
[facility] [address] [facility] [place], [place], [address] Phone: [phone]   Fax:  [phone]  Physical Therapy Evaluation  Patient Details  Name: Brenda Franklin MRN: [id number] Date of Birth: 08/03/1946 Referring Provider (PT): [name], MD   Encounter Date: 08/23/2019  PT End of Session - 08/24/19 0959    Visit Number  1    Number of Visits  5    Date for PT Re-Evaluation  09/23/19    Authorization Type  Medicare and [employer] Supplement; covered 100%    PT Start Time  1620    PT Stop Time  1706    PT Time Calculation (min)  46 min    Activity Tolerance  Patient tolerated treatment well    Behavior During Therapy  [facility] for tasks assessed/performed       Past Medical History:  Diagnosis Date  . BCC (basal cell carcinoma) 12/09/2012   right inner eye (Cx35FU)  . Cataracts, bilateral   . Compression fracture of fifth lumbar vertebra ([place])   . Depression   . Extremity pain   . High cholesterol     Past Surgical History:  Procedure Laterality Date  . ABDOMINAL HYSTERECTOMY    . BREAST ENHANCEMENT SURGERY    . COLONOSCOPY    . ESOPHAGEAL DILATION    . EXCISION VAGINAL CYST    . HEMORRHOID SURGERY    . metal stimulator for bowel control     Removed  . NOSE SURGERY      There were no vitals filed for this visit.   Subjective Assessment - 08/23/19 1630    Subjective  Dizziness began when she was on her knees looking under her dresser, when she got back up her head was spinning.  Pt did vomit.  Vertigo resolved after a few days but continues to have nausea.  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  After that she would get intermittent episodes of vertigo but not as severe as this episode.    Patient is accompained by:  Family member    Pertinent History  high cholesterol, depression, lumbar vertebral  compression fracture, bilat cataracts, basal cell carcinoma, h/o hearing loss and tinnitus bilaterally    Patient Stated Goals  Doctor suggested to look at the "rocks" in her ears - wants to know more about that    Currently in Pain?  No/denies         [facility] PT Assessment - 08/23/19 1644      Assessment   Medical Diagnosis  Vertigo    Referring Provider (PT)  [name], MD    Onset Date/Surgical Date  08/16/19    Prior Therapy  no      Precautions   Precautions  Other (comment)    Precaution Comments  high cholesterol, depression, lumbar vertebral compression fracture, bilat cataracts, basal cell carcinoma, h/o hearing loss and tinnitus bilaterally      Balance Screen   Has the patient fallen in the past 6 months  Yes    How many times?  2   in bathroom, not major falls - slipped     Prior Function   Level of Independence  Independent      Observation/Other Assessments   Focus on Therapeutic Outcomes (FOTO)   Not assessed           Vestibular Assessment -  08/23/19 1645      Symptom Behavior   Subjective history of current problem  Started medication after first day of spinning, vertigo improved but nausea persists.  Denies changes in vision.  Has 15 year history of hearing loss and tinnitus    Type of Dizziness   Spinning    Frequency of Dizziness  Intermittent with positional changes    Duration of Dizziness  hours    Symptom Nature  Motion provoked;Positional    Aggravating Factors  Activity in general;Lying supine;Forward bending    Relieving Factors  Medication;Head stationary    Progression of Symptoms  Better      Oculomotor Exam   Oculomotor Alignment  Normal    Ocular ROM  WFL    Spontaneous  Absent    Gaze-induced   Absent    Smooth Pursuits  Intact    Saccades  Intact      Oculomotor Exam-Fixation Suppressed    Left Head Impulse  negative    Right Head Impulse  negative      Vestibulo-Ocular Reflex   VOR to Slow Head Movement  Normal    VOR  Cancellation  Normal      Positional Testing   Dix-Hallpike  Dix-Hallpike Right;Dix-Hallpike Left    Horizontal Canal Testing  Horizontal Canal Right;Horizontal Canal Left      Dix-Hallpike Right   Dix-Hallpike Right Duration  0    Dix-Hallpike Right Symptoms  No nystagmus      Dix-Hallpike Left   Dix-Hallpike Left Duration  10 seconds    Dix-Hallpike Left Symptoms  No nystagmus   feeling as if she is going to get dizzy     Horizontal Canal Right   Horizontal Canal Right Duration  0    Horizontal Canal Right Symptoms  Normal      Horizontal Canal Left   Horizontal Canal Left Duration  0    Horizontal Canal Left Symptoms  Normal      Positional Sensitivities   Right Hallpike  Moderate dizziness    Up from Right Hallpike  Moderate dizziness    Up from Left Hallpike  Moderate dizziness    Rolling Right  No dizziness    Rolling Left  No dizziness          Objective measurements completed on examination: See above findings.              PT Education - 08/24/19 0956    Education Details  mechanism of BPPV and other causes of vertigo; clinical findings, PT POC and goals of treatment    Person(s) Educated  Patient;Child(ren)    Methods  Explanation    Comprehension  Verbalized understanding          PT Long Term Goals - 08/24/19 1006      PT LONG TERM GOAL #1   Title  Pt will demonstrate independence with final vestibular and balance HEP    Time  4    Period  Weeks    Status  New    Target Date  09/23/19      PT LONG TERM GOAL #2   Title  Pt will report no dizziness with bed mobility, bending down to the floor or when turning head/body quickly    Baseline  3/5 sit <> supine; others TBD    Time  4    Period  Weeks    Status  New    Target Date  09/23/19      PT  LONG TERM GOAL #3   Title  Pt will demonstrate negative testing for position vertigo bilaterally    Time  4    Period  Weeks    Status  New    Target Date  09/23/19      PT LONG TERM  GOAL #4   Title  Pt will ambulate x 1000' outside over paved and grassy terrain independently while performing head turns without reports of symptoms and will report to outdoor walking program at home    Time  4    Period  Weeks    Status  New    Target Date  09/23/19             Plan - 08/24/19 1000    Clinical Impression Statement  Pt is a 73 year old female referred to Neuro OPPT for evaluation of vertigo.  Pt's PMH is significant for the following: high cholesterol, depression, lumbar vertebral compression fracture, bilat cataracts, basal cell carcinoma, h/o hearing loss and tinnitus bilaterally. The following deficits were noted during pt's exam:  Pt reported vertigo of short duration but no nystagmus observed during L hallpike-dix which may indicate L posterior canal BPPV, motion sensitivity, impaired balance and difficulty walking placing pt at increased risk for falls. Pt would benefit from skilled PT to address these impairments and functional limitations to maximize functional mobility independence and reduce falls risk.    Personal Factors and Comorbidities  Comorbidity 3+;Past/Current Experience    Comorbidities  high cholesterol, depression, lumbar vertebral compression fracture, bilat cataracts, basal cell carcinoma, h/o hearing loss and tinnitus bilaterally    Examination-Activity Limitations  Bed Mobility;Bend;Locomotion Level;Stand;Transfers    Examination-Participation Restrictions  Community Activity    Stability/Clinical Decision Making  Stable/Uncomplicated    Clinical Decision Making  Low    Rehab Potential  Good    PT Frequency  1x / week    PT Duration  4 weeks    PT Treatment/Interventions  ADLs/Self Care Home Management;Canalith Repostioning;Functional mobility training;Therapeutic activities;Therapeutic exercise;Balance training;Neuromuscular re-education;Patient/family education;Vestibular    PT Next Visit Plan  re-assess L dix-hallpike and treat if indicated;  finish MSQ.  Initiate x1 viewing, habituation, standing balance.  Incorporate outside walking into walking program for HEP!    Consulted and Agree with Plan of Care  Patient;Family member/caregiver    Family Member Consulted  daughter       Patient will benefit from skilled therapeutic intervention in order to improve the following deficits and impairments:  Decreased activity tolerance, Decreased balance, Difficulty walking, Dizziness  Visit Diagnosis: BPPV (benign paroxysmal positional vertigo), left  Dizziness and giddiness  Unsteadiness on feet  Difficulty in walking, not elsewhere classified     Problem List Patient Active Problem List   Diagnosis Date Noted  . Bilateral carpal tunnel syndrome 04/16/2017  . Neck pain 02/10/2017  . Paresthesia 02/10/2017  . Left shoulder pain 02/10/2017    Rico Junker, PT, DPT 08/24/19    10:12 AM    [facility] [address] [place], [place], [address] Phone: [phone]   Fax:  [phone]  Name: Brenda Franklin MRN: [id number] Date of Birth: 1946/07/23

## 2019-08-30 ENCOUNTER — Ambulatory Visit: Payer: Medicare Other

## 2019-08-30 ENCOUNTER — Other Ambulatory Visit: Payer: Self-pay

## 2019-08-30 DIAGNOSIS — H8112 Benign paroxysmal vertigo, left ear: Secondary | ICD-10-CM

## 2019-08-30 DIAGNOSIS — R42 Dizziness and giddiness: Secondary | ICD-10-CM

## 2019-08-30 DIAGNOSIS — R2681 Unsteadiness on feet: Secondary | ICD-10-CM

## 2019-08-30 DIAGNOSIS — R262 Difficulty in walking, not elsewhere classified: Secondary | ICD-10-CM | POA: Diagnosis not present

## 2019-08-30 NOTE — Patient Instructions (Signed)
Gaze Stabilization: Standing Feet Apart    Feet shoulder width apart, keeping eyes on target on wall _3-5___ feet away, tilt head down 15-30 and move head side to side for __30__ seconds. Repeat while moving head up and down for __30__ seconds. Do __2__ sessions per day.   Copyright  VHI. All rights reserved.  Gaze Stabilization: Tip Card  1.Target must remain in focus, not blurry, and appear stationary while head is in motion. 2.Perform exercises with small head movements (45 to either side of midline). 3.Increase speed of head motion so long as target is in focus. 4.If you wear eyeglasses, be sure you can see target through lens (therapist will give specific instructions for bifocal / progressive lenses). 5.These exercises may provoke dizziness or nausea. Work through these symptoms. If too dizzy, slow head movement slightly. Rest between each exercise. Stop if dizziness goes above 6/10 and rest, then try again later.  6.Exercises demand concentration; avoid distractions. 7.For safety, perform standing exercises close to a counter, wall, corner, or next to someone.  Copyright  VHI. All rights reserved.

## 2019-08-30 NOTE — Therapy (Signed)
[facility] [address] [place], [place], [address] Phone: [phone]   Fax:  [phone]  Physical Therapy Treatment  Patient Details  Name: Brenda Franklin MRN: [id number] Date of Birth: 14-Aug-1946 Referring Provider (PT): [name], MD   Encounter Date: 08/30/2019  PT End of Session - 08/30/19 1832    Visit Number  2    Number of Visits  5    Date for PT Re-Evaluation  09/23/19    Authorization Type  Medicare and [employer] Supplement; covered 100%    PT Start Time  1748    PT Stop Time  1828    PT Time Calculation (min)  40 min    Equipment Utilized During Treatment  Other (comment)   min A to S prn   Activity Tolerance  Patient tolerated treatment well    Behavior During Therapy  [facility] for tasks assessed/performed       Past Medical History:  Diagnosis Date  . BCC (basal cell carcinoma) 12/09/2012   right inner eye (Cx35FU)  . Cataracts, bilateral   . Compression fracture of fifth lumbar vertebra ([place])   . Depression   . Extremity pain   . High cholesterol     Past Surgical History:  Procedure Laterality Date  . ABDOMINAL HYSTERECTOMY    . BREAST ENHANCEMENT SURGERY    . COLONOSCOPY    . ESOPHAGEAL DILATION    . EXCISION VAGINAL CYST    . HEMORRHOID SURGERY    . metal stimulator for bowel control     Removed  . NOSE SURGERY      There were no vitals filed for this visit.  Subjective Assessment - 08/30/19 1751    Subjective  Pt reported dizziness is gone and she feels better since last visit. No vomiting since last visit but did have some nausea.    Pertinent History  high cholesterol, depression, lumbar vertebral compression fracture, bilat cataracts, basal cell carcinoma, h/o hearing loss and tinnitus bilaterally-HOH    Patient Stated Goals  Doctor suggested to look at the "rocks" in her ears - wants to know more about that    Currently in Pain?  No/denies        Vestibular Assessment - 08/30/19  1752      Positional Testing   Dix-Hallpike  Dix-Hallpike Left;Dix-Hallpike Right    Horizontal Canal Testing  Horizontal Canal Right;Horizontal Canal Left      Dix-Hallpike Right   Dix-Hallpike Right Duration  0    Dix-Hallpike Right Symptoms  No nystagmus      Dix-Hallpike Left   Dix-Hallpike Left Duration  A few seconds    Dix-Hallpike Left Symptoms  No nystagmus      Horizontal Canal Right   Horizontal Canal Right Duration  0    Horizontal Canal Right Symptoms  Normal      Horizontal Canal Left   Horizontal Canal Left Duration  0    Horizontal Canal Left Symptoms  Normal      Positional Sensitivities   Sit to Supine  No dizziness    Supine to Left Side  No dizziness    Supine to Right Side  No dizziness    Supine to Sitting  Mild dizziness    Right Hallpike  No dizziness    Up from Right Hallpike  Mild dizziness    Up from Left Hallpike  Mild dizziness    Nose to Right Knee  No dizziness    Right Knee to  Sitting  No dizziness    Nose to Left Knee  No dizziness    Left Knee to Sitting  No dizziness    Head Turning x 5  No dizziness    Head Nodding x 5  No dizziness    Pivot Right in Standing  No dizziness    Pivot Left in Standing  No dizziness    Rolling Right  No dizziness    Rolling Left  No dizziness    Positional Sensitivities Comments  Mild dizziness up from Left Dix-Hallpike. Pt required min A to maintain balance during R and L pivot turns.                 Vestibular Treatment/Exercise - 08/30/19 1816      Vestibular Treatment/Exercise   Vestibular Treatment Provided  Gaze    Gaze Exercises  X1 Viewing Horizontal;X1 Viewing Vertical      X1 Viewing Horizontal   Foot Position  seated and standing    Time  --   30 sec.    Reps  2    Comments  Cues and demo for proper technique. Please see pt instructions for HEP details.       X1 Viewing Vertical   Foot Position  seated and standing    Time  --   30 sec.    Reps  2    Comments  ues and  demo for proper technique. Please see pt instructions for HEP details.            Self Care: PT Education - 08/30/19 1831    Education Details  PT reiterated causes of vertigo, clinical findings which primary PT educated pt on last session. PT also initiated x1 viewing HEP in standing. Pt asked if she should use a new type of swab for ear and PT encouraged pt to speak with doctor if she's having difficulty removing ear wax. PT also explained that vertigo is likely from deep inner ear problem and not a build up of wax.    Person(s) Educated  Patient    Methods  Explanation;Demonstration;Handout    Comprehension  Verbalized understanding;Returned demonstration;Need further instruction          PT Long Term Goals - 08/24/19 1006      PT LONG TERM GOAL #1   Title  Pt will demonstrate independence with final vestibular and balance HEP    Time  4    Period  Weeks    Status  New    Target Date  09/23/19      PT LONG TERM GOAL #2   Title  Pt will report no dizziness with bed mobility, bending down to the floor or when turning head/body quickly    Baseline  3/5 sit <> supine; others TBD    Time  4    Period  Weeks    Status  New    Target Date  09/23/19      PT LONG TERM GOAL #3   Title  Pt will demonstrate negative testing for position vertigo bilaterally    Time  4    Period  Weeks    Status  New    Target Date  09/23/19      PT LONG TERM GOAL #4   Title  Pt will ambulate x 1000' outside over paved and grassy terrain independently while performing head turns without reports of symptoms and will report to outdoor walking program at home    Time  4    Period  Weeks    Status  New    Target Date  09/23/19            Plan - 08/30/19 1833    Clinical Impression Statement  Pt demonstrated progress, as she reported vertigo has ceased since last visit. Pt did report brief, 1-2 seconds, of dizziness during L Dix-Hallpike but no nystagmus noted. Pt did experience concordant  dizziness during several MSQ positions (supine to sit, and up from R and L Dix-Hallpike) and would likely benefit from habituation activities, as dizziness appears to by multi-factorial in nature. PT will assess FGA next session, as pt experience LOB during R and L pivot turns during MSQ. Continue with POC.    Personal Factors and Comorbidities  Comorbidity 3+;Past/Current Experience    Comorbidities  high cholesterol, depression, lumbar vertebral compression fracture, bilat cataracts, basal cell carcinoma, h/o hearing loss and tinnitus bilaterally    Examination-Activity Limitations  Bed Mobility;Bend;Locomotion Level;Stand;Transfers    Examination-Participation Restrictions  Community Activity    Stability/Clinical Decision Making  Stable/Uncomplicated    Rehab Potential  Good    PT Frequency  1x / week    PT Duration  4 weeks    PT Treatment/Interventions  ADLs/Self Care Home Management;Canalith Repostioning;Functional mobility training;Therapeutic activities;Therapeutic exercise;Balance training;Neuromuscular re-education;Patient/family education;Vestibular    PT Next Visit Plan  re-assess L dix-hallpike and treat if indicated.  Review x1 viewing as needed and initiate habituation and standing balance. Perform FGA. Incorporate outside walking into walking program for HEP!    Consulted and Agree with Plan of Care  Patient;Family member/caregiver    Family Member Consulted  daughter       Patient will benefit from skilled therapeutic intervention in order to improve the following deficits and impairments:  Decreased activity tolerance, Decreased balance, Difficulty walking, Dizziness  Visit Diagnosis: BPPV (benign paroxysmal positional vertigo), left  Dizziness and giddiness  Unsteadiness on feet     Problem List Patient Active Problem List   Diagnosis Date Noted  . Bilateral carpal tunnel syndrome 04/16/2017  . Neck pain 02/10/2017  . Paresthesia 02/10/2017  . Left shoulder pain  02/10/2017    Krishang Reading L 08/30/2019, 6:37 PM  [facility] [address] [place], [place], [address] Phone: [phone]   Fax:  [phone]  Name: Brenda Franklin MRN: [id number] Date of Birth: 1946-06-30  Geoffry Paradise, PT,DPT 08/30/19 6:38 PM Phone: [phone] Fax: [phone]

## 2019-09-09 ENCOUNTER — Ambulatory Visit: Payer: Medicare Other

## 2019-09-17 ENCOUNTER — Encounter

## 2019-09-20 ENCOUNTER — Ambulatory Visit: Payer: Medicare Other | Admitting: Physical Therapy

## 2019-09-20 ENCOUNTER — Encounter: Payer: Self-pay | Admitting: Physical Therapy

## 2019-09-20 NOTE — Therapy (Signed)
[facility] [address] [place], [place], [address] Phone: [phone]   Fax:  [phone]  Patient Details  Name: Florestine Carmical MRN: [id number] Date of Birth: Dec 08, 1945 Referring Provider:  No ref. provider found  Encounter Date: 09/20/2019  PHYSICAL THERAPY DISCHARGE SUMMARY  Visits from Start of Care: 2  Current functional level related to goals / functional outcomes: Unable to formally assess - pt did not return after second visit and called to cancel remaining visits.  Stated she was feeling better and would continue to do home exercises.   Remaining deficits: N/A   Education / Equipment: HEP  Plan: Patient agrees to discharge.  Patient goals were not met. Patient is being discharged due to the patient's request.  ?????          Rico Junker, PT, DPT 09/20/19    10:53 AM  [facility] [address] [place], [place], [address] Phone: [phone]   Fax:  [phone]

## 2020-04-18 ENCOUNTER — Encounter (INDEPENDENT_AMBULATORY_CARE_PROVIDER_SITE_OTHER): Payer: Self-pay | Admitting: Ophthalmology

## 2020-04-18 ENCOUNTER — Ambulatory Visit (INDEPENDENT_AMBULATORY_CARE_PROVIDER_SITE_OTHER): Payer: Medicare Other | Admitting: Ophthalmology

## 2020-04-18 ENCOUNTER — Other Ambulatory Visit: Payer: Self-pay

## 2020-04-18 DIAGNOSIS — H35033 Hypertensive retinopathy, bilateral: Secondary | ICD-10-CM

## 2020-04-18 DIAGNOSIS — H353131 Nonexudative age-related macular degeneration, bilateral, early dry stage: Secondary | ICD-10-CM

## 2020-04-18 DIAGNOSIS — I10 Essential (primary) hypertension: Secondary | ICD-10-CM | POA: Diagnosis not present

## 2020-04-18 DIAGNOSIS — H3581 Retinal edema: Secondary | ICD-10-CM

## 2020-04-18 DIAGNOSIS — H25813 Combined forms of age-related cataract, bilateral: Secondary | ICD-10-CM

## 2020-04-18 NOTE — Progress Notes (Addendum)
[facility] Note  04/18/2020     CHIEF COMPLAINT Patient presents for Blurred Vision   HISTORY OF PRESENT ILLNESS: Brenda Franklin is a 74 y.o. female who presents to the clinic today for:   HPI    Blurred Vision    In left eye.  Vision is blurred.  This started 5 days ago.  Since onset it is resolved.  Treatments tried include no treatments.  I, the attending physician,  performed the HPI with the patient and updated documentation appropriately.          Comments    She woke up with blurred vision OS x5 days ago.  She felt dizzy and sick on her stomach because the difference between the two eyes.  Lasted for 2 days.  On Saturday the blurred vision totally went away.  Now she is having a "deep ache" it last for a quick second then goes away OS>OD.  Also, since the incident she her eyes feel really tired.   Several years ago she was told she needed to have cataract sx OS.       Last edited by Bernarda Caffey, MD on 04/18/2020 10:56 AM. (History)    OS became suddenly blurred about 1 wk ago.  Symptoms lasted for about 3 days, and then vision gradually returned to normal.  Pt was nauseated and dizzy while symptoms persisted.  Pt here today to get checked out and for peace of mind.  OS continues to ache intermittently.  Referring physician: Calvert Cantor, MD [address] [place],  [place] [address]  HISTORICAL INFORMATION:   Selected notes from the MEDICAL RECORD NUMBER     CURRENT MEDICATIONS: No current outpatient medications on file. (Ophthalmic Drugs)   No current facility-administered medications for this visit. (Ophthalmic Drugs)   Current Outpatient Medications (Other)  Medication Sig  . citalopram (CELEXA) 10 MG tablet Take 10 mg by mouth daily.  Marland Kitchen albuterol (PROVENTIL HFA;VENTOLIN HFA) 108 (90 Base) MCG/ACT inhaler Inhale 1-2 puffs into the lungs every 6 (six) hours as needed for wheezing or shortness of breath. (Patient not taking:  Reported on 08/23/2019)  . escitalopram (LEXAPRO) 20 MG tablet Take 20 mg by mouth daily.  . fluticasone (FLONASE) 50 MCG/ACT nasal spray Place 1 spray into both nostrils daily. (Patient not taking: Reported on 08/23/2019)  . simvastatin (ZOCOR) 20 MG tablet Take 20 mg by mouth daily.  Marland Kitchen zolpidem (AMBIEN) 5 MG tablet Take 5 mg by mouth at bedtime as needed for sleep.   No current facility-administered medications for this visit. (Other)      REVIEW OF SYSTEMS: ROS    Positive for: Neurological, Skin, Eyes   Negative for: Constitutional, Gastrointestinal, Genitourinary, Musculoskeletal, HENT, Endocrine, Cardiovascular, Respiratory, Psychiatric, Allergic/Imm, Heme/Lymph   Last edited by Leonie Douglas, COA on 04/18/2020 10:09 AM. (History)       ALLERGIES Allergies  Allergen Reactions  . Tolmetin Nausea And Vomiting  . Codeine Nausea And Vomiting and Other (See Comments)    Upset stomach  . Milk-Related Compounds Diarrhea  . Nsaids Diarrhea and Other (See Comments)    Stomach upset  . Oxycodone Nausea And Vomiting    PAST MEDICAL HISTORY Past Medical History:  Diagnosis Date  . BCC (basal cell carcinoma) 12/09/2012   right inner eye (Cx35FU)  . Cataracts, bilateral   . Compression fracture of fifth lumbar vertebra ([place])   . Depression   . Extremity pain   . High  cholesterol    Past Surgical History:  Procedure Laterality Date  . ABDOMINAL HYSTERECTOMY    . BREAST ENHANCEMENT SURGERY    . COLONOSCOPY    . ESOPHAGEAL DILATION    . EXCISION VAGINAL CYST    . HEMORRHOID SURGERY    . metal stimulator for bowel control     Removed  . NOSE SURGERY      FAMILY HISTORY Family History  Problem Relation Age of Onset  . Other Mother        Intestinal problem - gangrene  . Cancer Father        Bile Duct Cancer  . Heart disease Father   . Heart attack Father   . Hypercholesterolemia Father   . Kidney cancer Sister   . Kidney cancer Brother     SOCIAL  HISTORY Social History   Tobacco Use  . Smoking status: Current Every Day Smoker    Packs/day: 0.75    Types: Cigarettes  . Smokeless tobacco: Never Used  Vaping Use  . Vaping Use: Never used  Substance Use Topics  . Alcohol use: No  . Drug use: No         OPHTHALMIC EXAM:  Base Eye Exam    Visual Acuity (Snellen - Linear)      Right Left   Dist [place] 20/30 20/50   Dist ph [place] NI 20/30       Tonometry (Tonopen, 10:27 AM)      Right Left   Pressure 17 18       Pupils      Dark Light Shape React APD   Right 2 1 Round Brisk None   Left 2.5 1 Round Brisk None  Appears to be hippus OS instead of APD       Visual Fields (Counting fingers)      Left Right    Full Full       Extraocular Movement      Right Left    Full Full       Neuro/Psych    Oriented x3: Yes   Mood/Affect: Normal       Dilation    Both eyes: 1.0% Mydriacyl, 2.5% Phenylephrine @ 10:28 AM        Slit Lamp and Fundus Exam    Slit Lamp Exam      Right Left   Lids/Lashes Dermato Dermato   Conjunctiva/Sclera White and quiet White and quiet   Cornea Arcus, trace PEE, mild tear film debris Arcus, 1+ PEE, mild tear film debris   Anterior Chamber Deep and clear Deep and clear   Iris Round and moderately dilated Round and moderately dilated   Lens 3-4+ NS w/brunescence; 3+ cortical 3-4+ NS w/brunescence; 3+ cortical   Vitreous Synerisis Synerisis       Fundus Exam      Right Left   Disc Hazy view.  Pink, sharp, compact. Hazy view.  Pink, sharp, compact.  Mild temporal PPA.   C/D Ratio 0.1 0.1   Macula Hazy view.  Grossly flat.  No heme or edema.  +Drusen and RPE mottling. Hazy view.  Grossly flat.  Blunted foveal reflex.  +Drusen and RPE mottling.  No heme or edema.   Vessels Attenuated, tortuous. Attenuated, tortuous.  Mild AV crossing changes.,   Periphery Attached.  Reticular degeneration.  No heme. Attached.  Reticular degeneration.        Refraction    Manifest Refraction       Sphere Cylinder Axis  Dist VA   Right -0.50 Sphere  20/30   Left -1.00 +0.50 105 20/50+2          IMAGING AND PROCEDURES  Imaging and Procedures for 04/18/2020  OCT, Retina - OU - Both Eyes       Right Eye Quality was good. Central Foveal Thickness: 258. Progression has no prior data. Findings include normal foveal contour, no IRF, no SRF, retinal drusen .   Left Eye Quality was good. Central Foveal Thickness: 257. Progression has no prior data. Findings include normal foveal contour, no IRF, no SRF, retinal drusen .   Notes *Images captured and stored on drive  Diagnosis / Impression:  Non-exu ARMD OU  Clinical management:  See below  Abbreviations: NFP - Normal foveal profile. CME - cystoid macular edema. PED - pigment epithelial detachment. IRF - intraretinal fluid. SRF - subretinal fluid. EZ - ellipsoid zone. ERM - epiretinal membrane. ORA - outer retinal atrophy. ORT - outer retinal tubulation. SRHM - subretinal hyper-reflective material. IRHM - intraretinal hyper-reflective material                 ASSESSMENT/PLAN:    ICD-10-CM   1. Early dry stage nonexudative age-related macular degeneration of both eyes  H35.3131   2. Retinal edema  H35.81 OCT, Retina - OU - Both Eyes  3. Essential hypertension  I10   4. Hypertensive retinopathy of both eyes  H35.033   5. Combined forms of age-related cataract of both eyes  H25.813     1. Age related macular degeneration, non-exudative, both eyes   - early stage w/ just mild drusen  - The incidence, anatomy, and pathology of dry AMD, risk of progression, and the AREDS and AREDS 2 study including smoking risks discussed with patient.  - Recommend amsler grid monitoring             - F/u in 3-4 mos  2. No retinal edema  3,4. Hypertensive retinopathy OU - discussed importance of tight BP control. - BP in office 7.20.21: 156/72 - Recommend seeing a PCP for eval and management - monitor  5. Mixed Cataract OU - The  symptoms of cataract, surgical options, and treatments and risks were discussed with patient. - discussed diagnosis and progression - Visually significant.  3-4+ NS OU; 3+ cortical OU. - Cataract sx OU was recommended to pt yrs ago. - Recommend pt keeping existing appt w/[facility] for establishment of primary eye care and cataract eval OU  6. Dry eyes OU - recommend artificial tears and lubricating ointment as needed - Most likely cause for most recent flare-up of blurred vision OS   Ophthalmic Meds Ordered this visit:  No orders of the defined types were placed in this encounter.      Return for 3-4 mo f/u for non-exu ARMD OU w/DFE&OCT.  There are no Patient Instructions on file for this visit.   Explained the diagnoses, plan, and follow up with the patient and they expressed understanding.  Patient expressed understanding of the importance of proper follow up care.  This document serves as a record of services personally performed by Gardiner Sleeper, MD, PhD. It was created on their behalf by Estill Bakes, COT an ophthalmic technician. The creation of this record is the provider's dictation and/or activities during the visit.    Electronically signed by: Estill Bakes, COT 04/18/20 @ 11:36 PM  Gardiner Sleeper, M.D., Ph.D. Diseases & Surgery of the Retina and Vitreous [facility]  [place] 04/18/20  I have reviewed the above documentation for accuracy and completeness, and I agree with the above. Gardiner Sleeper, M.D., Ph.D. 04/19/20 11:36 PM    Abbreviations: M myopia (nearsighted); A astigmatism; H hyperopia (farsighted); P presbyopia; Mrx spectacle prescription;  CTL contact lenses; OD right eye; OS left eye; OU both eyes  XT exotropia; ET esotropia; PEK punctate epithelial keratitis; PEE punctate epithelial erosions; DES dry eye syndrome; MGD meibomian gland dysfunction; ATs artificial tears; PFAT's preservative free artificial tears; [place] nuclear sclerotic  cataract; PSC posterior subcapsular cataract; ERM epi-retinal membrane; PVD posterior vitreous detachment; RD retinal detachment; DM diabetes mellitus; DR diabetic retinopathy; NPDR non-proliferative diabetic retinopathy; PDR proliferative diabetic retinopathy; CSME clinically significant macular edema; DME diabetic macular edema; dbh dot blot hemorrhages; CWS cotton wool spot; POAG primary open angle glaucoma; C/D cup-to-disc ratio; HVF humphrey visual field; GVF goldmann visual field; OCT optical coherence tomography; IOP intraocular pressure; BRVO Branch retinal vein occlusion; CRVO central retinal vein occlusion; CRAO central retinal artery occlusion; BRAO branch retinal artery occlusion; RT retinal tear; SB scleral buckle; PPV pars plana vitrectomy; VH Vitreous hemorrhage; PRP panretinal laser photocoagulation; IVK intravitreal kenalog; VMT vitreomacular traction; MH Macular hole;  NVD neovascularization of the disc; NVE neovascularization elsewhere; AREDS age related eye disease study; ARMD age related macular degeneration; POAG primary open angle glaucoma; EBMD epithelial/anterior basement membrane dystrophy; ACIOL anterior chamber intraocular lens; IOL intraocular lens; PCIOL posterior chamber intraocular lens; Phaco/IOL phacoemulsification with intraocular lens placement; [place] photorefractive keratectomy; LASIK laser assisted in situ keratomileusis; HTN hypertension; DM diabetes mellitus; COPD chronic obstructive pulmonary disease

## 2020-04-21 DIAGNOSIS — I10 Essential (primary) hypertension: Secondary | ICD-10-CM | POA: Diagnosis not present

## 2020-04-21 DIAGNOSIS — F5101 Primary insomnia: Secondary | ICD-10-CM | POA: Diagnosis not present

## 2020-05-15 DIAGNOSIS — E559 Vitamin D deficiency, unspecified: Secondary | ICD-10-CM | POA: Diagnosis not present

## 2020-05-15 DIAGNOSIS — R7989 Other specified abnormal findings of blood chemistry: Secondary | ICD-10-CM | POA: Diagnosis not present

## 2020-05-15 DIAGNOSIS — E7800 Pure hypercholesterolemia, unspecified: Secondary | ICD-10-CM | POA: Diagnosis not present

## 2020-05-15 DIAGNOSIS — I10 Essential (primary) hypertension: Secondary | ICD-10-CM | POA: Diagnosis not present

## 2020-05-24 DIAGNOSIS — M546 Pain in thoracic spine: Secondary | ICD-10-CM | POA: Diagnosis not present

## 2020-05-24 DIAGNOSIS — R0789 Other chest pain: Secondary | ICD-10-CM | POA: Diagnosis not present

## 2020-05-24 DIAGNOSIS — F5101 Primary insomnia: Secondary | ICD-10-CM | POA: Diagnosis not present

## 2020-05-24 DIAGNOSIS — E7800 Pure hypercholesterolemia, unspecified: Secondary | ICD-10-CM | POA: Diagnosis not present

## 2020-05-24 DIAGNOSIS — I10 Essential (primary) hypertension: Secondary | ICD-10-CM | POA: Diagnosis not present

## 2020-05-25 ENCOUNTER — Other Ambulatory Visit: Payer: Self-pay

## 2020-05-25 ENCOUNTER — Ambulatory Visit: Payer: Medicare Other | Admitting: Cardiology

## 2020-05-25 ENCOUNTER — Encounter: Payer: Self-pay | Admitting: Cardiology

## 2020-05-25 VITALS — BP 138/65 | HR 72 | Resp 16 | Ht 64.0 in | Wt 138.8 lb

## 2020-05-25 DIAGNOSIS — I119 Hypertensive heart disease without heart failure: Secondary | ICD-10-CM | POA: Insufficient documentation

## 2020-05-25 DIAGNOSIS — Z8249 Family history of ischemic heart disease and other diseases of the circulatory system: Secondary | ICD-10-CM

## 2020-05-25 DIAGNOSIS — G8929 Other chronic pain: Secondary | ICD-10-CM

## 2020-05-25 DIAGNOSIS — F17210 Nicotine dependence, cigarettes, uncomplicated: Secondary | ICD-10-CM | POA: Insufficient documentation

## 2020-05-25 DIAGNOSIS — I10 Essential (primary) hypertension: Secondary | ICD-10-CM | POA: Diagnosis not present

## 2020-05-25 DIAGNOSIS — R0989 Other specified symptoms and signs involving the circulatory and respiratory systems: Secondary | ICD-10-CM | POA: Diagnosis not present

## 2020-05-25 DIAGNOSIS — M546 Pain in thoracic spine: Secondary | ICD-10-CM | POA: Insufficient documentation

## 2020-05-25 DIAGNOSIS — F17200 Nicotine dependence, unspecified, uncomplicated: Secondary | ICD-10-CM

## 2020-05-25 DIAGNOSIS — E782 Mixed hyperlipidemia: Secondary | ICD-10-CM | POA: Diagnosis not present

## 2020-05-25 MED ORDER — ATORVASTATIN CALCIUM 40 MG PO TABS: 40.0000 mg | ORAL_TABLET | Freq: Every day | ORAL | 3 refills | Status: DC

## 2020-05-25 NOTE — Progress Notes (Signed)
Patient referred by [name], MD for back pain, possible angina equivalent  Subjective:   Brenda Franklin, female    DOB: May 01, 1946, 74 y.o.   MRN: [id number]   Chief Complaint  Patient presents with  . Fatigue  . Hyperlipidemia  . New Patient (Initial Visit)    HPI  74 y.o. Caucasian female with hypertension, hyperlipidemia, tobacco dependence, referred for evaluation of possible angina equivalent.  Patient is here with her daughter today.  Patient lives by herself, performs all her ADLs by itself.  She also takes care of her puppy.  She is able to perform these activities without much difficulty through the morning.  However, around noon onwards, she reports having deep pain in her back radiating across to the front of her chest.  Pain last for several hours, until she finally lays down at night.  Pain is not typically worse with physical exertion.  Patient reports having had issues with neck and back pain over the years.  She has not undergone any surgery.  She does endorse exertional dyspnea, which has been stable and unchanged.  She has never undergone pulmonary function testing. Unfortunately, she continues to smoke 1 pack/day.  She reports family history of coronary artery disease in her father.  She is unaware if there is any family history of familial hypercholesterolemia.  Reviewed outside records including EKG and labs.  Past Medical History:  Diagnosis Date  . BCC (basal cell carcinoma) 12/09/2012   right inner eye (Cx35FU)  . Cataracts, bilateral   . Compression fracture of fifth lumbar vertebra ([place])   . Depression   . Extremity pain   . High cholesterol      Past Surgical History:  Procedure Laterality Date  . ABDOMINAL HYSTERECTOMY    . BREAST ENHANCEMENT SURGERY    . COLONOSCOPY    . ESOPHAGEAL DILATION    . EXCISION VAGINAL CYST    . HEMORRHOID SURGERY    . metal stimulator for bowel control     Removed  . NOSE SURGERY       Social History    Tobacco Use  Smoking Status Current Every Day Smoker  . Packs/day: 0.75  . Types: Cigarettes  Smokeless Tobacco Never Used    Social History   Substance and Sexual Activity  Alcohol Use No     Family History  Problem Relation Age of Onset  . Other Mother        Intestinal problem - gangrene  . Cancer Father        Bile Duct Cancer  . Heart disease Father   . Heart attack Father   . Hypercholesterolemia Father   . Kidney cancer Sister   . Kidney cancer Brother      Current Outpatient Medications on File Prior to Visit  Medication Sig Dispense Refill  . albuterol (PROVENTIL HFA;VENTOLIN HFA) 108 (90 Base) MCG/ACT inhaler Inhale 1-2 puffs into the lungs every 6 (six) hours as needed for wheezing or shortness of breath. (Patient not taking: Reported on 08/23/2019) 1 Inhaler 0  . citalopram (CELEXA) 10 MG tablet Take 10 mg by mouth daily.    Marland Kitchen escitalopram (LEXAPRO) 20 MG tablet Take 20 mg by mouth daily.    . fluticasone (FLONASE) 50 MCG/ACT nasal spray Place 1 spray into both nostrils daily. (Patient not taking: Reported on 08/23/2019) 16 g 2  . simvastatin (ZOCOR) 20 MG tablet Take 20 mg by mouth daily.    Marland Kitchen zolpidem (AMBIEN) 5 MG tablet  Take 5 mg by mouth at bedtime as needed for sleep.     No current facility-administered medications on file prior to visit.    Cardiovascular and other pertinent studies:  EKG 05/25/2020: Sinus rhythm 62 bpm Normal EKG  EKG 05/24/2020: Sinus rhythm 62 bpm Normal EKG  Recent labs: 05/15/2020: EGFR 73 Chol N/A, TG 160, HDL 68, LDL 207 TSH 2.9 normal   Review of Systems  Cardiovascular: Positive for chest pain and dyspnea on exertion. Negative for leg swelling, palpitations and syncope.         Vitals:   05/25/20 1529  BP: 138/65  Pulse: 72  Resp: 16  SpO2: 97%     Body mass index is 23.82 kg/m. Filed Weights   05/25/20 1529  Weight: 138 lb 12.8 oz (63 kg)     Objective:   Physical Exam Vitals and nursing  note reviewed.  Constitutional:      General: She is not in acute distress. Neck:     Vascular: No JVD.  Cardiovascular:     Rate and Rhythm: Normal rate and regular rhythm.     Pulses:          Dorsalis pedis pulses are 0 on the right side and 0 on the left side.       Posterior tibial pulses are 1+ on the right side and 1+ on the left side.     Heart sounds: Normal heart sounds. No murmur heard.   Pulmonary:     Effort: Pulmonary effort is normal.     Breath sounds: Normal breath sounds. No wheezing or rales.         Assessment & Recommendations:   74 y.o. Caucasian female with hypertension, hyperlipidemia, tobacco dependence, referred for evaluation of possible angina equivalent.  Back pain: Although there is radiation to chest, I do not believe this is angina.  Pain lasting for several hours, and resolving only after being at night, suggest musculoskeletal etiology.  I suspect she may have thoracic radiculopathy.  Read below regarding cardiac recertification.  Exertional dyspnea: Most likely related to tobacco dependence and possible COPD.  Angina equivalent less likely.  Will obtain echocardiogram.  Defer pulmonary function testing to PCP.  Cardiac risk stratification: High risk factors including hypertension, hyperlipidemia, tobacco dependence.  Recommend calcium score scan for recertification.  Switch to simvastatin 20 mg rosuvastatin 40 mg.  We will repeat lipid panel in 4 weeks.  If she does have elevated calcium score, could even consider PCSK9 inhibitor.  If she has calcium score > 400, will consider stress testing.  However, any cardiac work-up and management is not expected to resolve her pain which is most likely musculoskeletal.  Abnormal peripheral pulse: Absent DP bilaterally.  Given her high risk factors, will obtain ABI for baseline assessment.  Tobacco dependence: Tobacco cessation counseling:  - Currently smoking 3/4-1 packs/day   - Patient was  informed of the dangers of tobacco abuse including stroke, cancer, and MI, as well as benefits of tobacco cessation. - Patient is willing to quit at this time. - Approximately 10 mins were spent counseling patient cessation techniques. We discussed various methods to help quit smoking, including deciding on a date to quit, joining a support group, pharmacological agents. Patient would like to try quitting on her own first.   - I will reassess her progress at the next follow-up visit  I discussed with the patient regarding the risks of Covid infection, hospitalization, and death.  I strongly recommended getting vaccinated  against Covid as a safe and FDA approved measure to reduce risk of infection, hospitalization, as well as death.  Time spent: 60 min   Thank you for referring the patient to [place]. Please feel free to contact with any questions.   Nigel Mormon, MD Pager: [phone] Office: [phone]

## 2020-05-26 DIAGNOSIS — Z23 Encounter for immunization: Secondary | ICD-10-CM | POA: Diagnosis not present

## 2020-06-02 ENCOUNTER — Other Ambulatory Visit: Payer: Self-pay | Admitting: Cardiology

## 2020-06-02 DIAGNOSIS — R931 Abnormal findings on diagnostic imaging of heart and coronary circulation: Secondary | ICD-10-CM

## 2020-06-02 DIAGNOSIS — R0609 Other forms of dyspnea: Secondary | ICD-10-CM

## 2020-06-02 DIAGNOSIS — F17200 Nicotine dependence, unspecified, uncomplicated: Secondary | ICD-10-CM

## 2020-06-02 NOTE — Progress Notes (Signed)
Reviewed Calcium score with the patient. Given elevated calcium score and exertional dyspnea, recommend Lexiscan stress test. Please schedule stress test. Keep f/u on 9/30.   Thanks MJP

## 2020-06-14 DIAGNOSIS — H35033 Hypertensive retinopathy, bilateral: Secondary | ICD-10-CM | POA: Diagnosis not present

## 2020-06-14 DIAGNOSIS — H353131 Nonexudative age-related macular degeneration, bilateral, early dry stage: Secondary | ICD-10-CM | POA: Diagnosis not present

## 2020-06-14 DIAGNOSIS — H04123 Dry eye syndrome of bilateral lacrimal glands: Secondary | ICD-10-CM | POA: Diagnosis not present

## 2020-06-14 DIAGNOSIS — H2513 Age-related nuclear cataract, bilateral: Secondary | ICD-10-CM | POA: Diagnosis not present

## 2020-06-16 DIAGNOSIS — Z23 Encounter for immunization: Secondary | ICD-10-CM | POA: Diagnosis not present

## 2020-06-19 ENCOUNTER — Encounter: Payer: Self-pay | Admitting: Dermatology

## 2020-06-19 ENCOUNTER — Ambulatory Visit (INDEPENDENT_AMBULATORY_CARE_PROVIDER_SITE_OTHER): Payer: Medicare Other | Admitting: Dermatology

## 2020-06-19 ENCOUNTER — Other Ambulatory Visit: Payer: Self-pay

## 2020-06-19 DIAGNOSIS — Z1283 Encounter for screening for malignant neoplasm of skin: Secondary | ICD-10-CM | POA: Diagnosis not present

## 2020-06-19 DIAGNOSIS — L720 Epidermal cyst: Secondary | ICD-10-CM | POA: Diagnosis not present

## 2020-06-19 DIAGNOSIS — L821 Other seborrheic keratosis: Secondary | ICD-10-CM

## 2020-06-19 DIAGNOSIS — R931 Abnormal findings on diagnostic imaging of heart and coronary circulation: Secondary | ICD-10-CM | POA: Diagnosis not present

## 2020-06-19 NOTE — Patient Instructions (Addendum)
Several issues discussed today with Brenda Franklin date of birth 1946/08/15.  Brenda Franklin is embarrassed by having to visible facial cysts, the larger one being along the posterior left mandible and a smaller one below the right mid mandible.  I explained that these are almost certainly benign epidermoid cysts and removal would be done at her choosing.  I encouraged her to check with her [employer] (we will provide CPT codes) to see if this would be a covered procedure.  This is done in the office with a local numbing and would require that I have an hour surgical time available.  On the left temple is a brown textured thickening called a seborrheic keratosis which is absolutely benign.  In the middle of her back is a hornlike 4 mm flesh-colored lesion which is somewhat bothersome.  Because Brenda Franklin has multiple doctor visits and possible procedure scheduled this week, Brenda Franklin chose to leave the lesion on the back for the time being.  There were no atypical moles or skin cancers on any area that examined.  Follow-up can be scheduled as a surgical if Brenda Franklin chooses.

## 2020-06-20 ENCOUNTER — Ambulatory Visit: Payer: Medicare Other

## 2020-06-20 DIAGNOSIS — I10 Essential (primary) hypertension: Secondary | ICD-10-CM | POA: Diagnosis not present

## 2020-06-20 DIAGNOSIS — Z8249 Family history of ischemic heart disease and other diseases of the circulatory system: Secondary | ICD-10-CM | POA: Diagnosis not present

## 2020-06-20 DIAGNOSIS — R0989 Other specified symptoms and signs involving the circulatory and respiratory systems: Secondary | ICD-10-CM

## 2020-06-21 ENCOUNTER — Ambulatory Visit: Payer: Medicare Other

## 2020-06-21 ENCOUNTER — Other Ambulatory Visit: Payer: Self-pay

## 2020-06-21 DIAGNOSIS — R931 Abnormal findings on diagnostic imaging of heart and coronary circulation: Secondary | ICD-10-CM | POA: Diagnosis not present

## 2020-06-21 DIAGNOSIS — R0609 Other forms of dyspnea: Secondary | ICD-10-CM | POA: Diagnosis not present

## 2020-06-22 ENCOUNTER — Other Ambulatory Visit: Payer: Self-pay | Admitting: Cardiology

## 2020-06-22 DIAGNOSIS — R931 Abnormal findings on diagnostic imaging of heart and coronary circulation: Secondary | ICD-10-CM

## 2020-06-22 DIAGNOSIS — E782 Mixed hyperlipidemia: Secondary | ICD-10-CM | POA: Diagnosis not present

## 2020-06-23 LAB — LIPID PANEL
Chol/HDL Ratio: 3.2 ratio (ref 0.0–4.4)
Cholesterol, Total: 196 mg/dL (ref 100–199)
HDL: 62 mg/dL (ref >39)
LDL Chol Calc (NIH): 114 mg/dL — ABNORMAL HIGH (ref 0–99)
Triglycerides: 111 mg/dL (ref 0–149)
VLDL Cholesterol Cal: 20 mg/dL (ref 5–40)

## 2020-06-28 DIAGNOSIS — F419 Anxiety disorder, unspecified: Secondary | ICD-10-CM | POA: Diagnosis not present

## 2020-06-28 DIAGNOSIS — R0602 Shortness of breath: Secondary | ICD-10-CM | POA: Diagnosis not present

## 2020-06-28 DIAGNOSIS — I10 Essential (primary) hypertension: Secondary | ICD-10-CM | POA: Diagnosis not present

## 2020-06-28 DIAGNOSIS — T63484A Toxic effect of venom of other arthropod, undetermined, initial encounter: Secondary | ICD-10-CM | POA: Diagnosis not present

## 2020-06-29 ENCOUNTER — Encounter: Payer: Self-pay | Admitting: Cardiology

## 2020-06-29 ENCOUNTER — Other Ambulatory Visit: Payer: Self-pay

## 2020-06-29 ENCOUNTER — Ambulatory Visit: Payer: Medicare Other | Admitting: Cardiology

## 2020-06-29 VITALS — BP 148/73 | HR 80 | Resp 16 | Ht 64.0 in | Wt 137.0 lb

## 2020-06-29 DIAGNOSIS — I2510 Atherosclerotic heart disease of native coronary artery without angina pectoris: Secondary | ICD-10-CM

## 2020-06-29 DIAGNOSIS — E782 Mixed hyperlipidemia: Secondary | ICD-10-CM | POA: Diagnosis not present

## 2020-06-29 DIAGNOSIS — R931 Abnormal findings on diagnostic imaging of heart and coronary circulation: Secondary | ICD-10-CM | POA: Diagnosis not present

## 2020-06-29 DIAGNOSIS — F17200 Nicotine dependence, unspecified, uncomplicated: Secondary | ICD-10-CM

## 2020-06-29 DIAGNOSIS — R0989 Other specified symptoms and signs involving the circulatory and respiratory systems: Secondary | ICD-10-CM | POA: Diagnosis not present

## 2020-06-29 DIAGNOSIS — I10 Essential (primary) hypertension: Secondary | ICD-10-CM | POA: Diagnosis not present

## 2020-06-29 MED ORDER — ATORVASTATIN CALCIUM 80 MG PO TABS: 80.0000 mg | ORAL_TABLET | Freq: Every day | ORAL | 2 refills | Status: DC

## 2020-06-29 MED ORDER — METOPROLOL TARTRATE 25 MG PO TABS: 25.0000 mg | ORAL_TABLET | Freq: Two times a day (BID) | ORAL | 3 refills | Status: DC

## 2020-06-29 NOTE — Progress Notes (Signed)
Patient referred by [name], MD for back pain, possible angina equivalent  Subjective:   Brenda Franklin, female    DOB: [birth year], 74 y.o.   MRN: [id number]   Chief Complaint  Patient presents with  . Mixed hyperlipidemia  . Follow-up    5 week     HPI  74 y.o. Caucasian female with hypertension, hyperlipidemia, tobacco dependence, coronary calcification.  She continues to have back pain throughout the day. Recently, she lost her son. Unfortunately, this has led to an uptick in her smoking. She has been compliant with statin.    Initial consultation HPI 04/2020: Patient is here with her daughter today.  Patient lives by herself, performs all her ADLs by itself.  She also takes care of her puppy.  She is able to perform these activities without much difficulty through the morning.  However, around noon onwards, she reports having deep pain in her back radiating across to the front of her chest.  Pain last for several hours, until she finally lays down at night.  Pain is not typically worse with physical exertion.  Patient reports having had issues with neck and back pain over the years.  She has not undergone any surgery.  She does endorse exertional dyspnea, which has been stable and unchanged.  She has never undergone pulmonary function testing. Unfortunately, she continues to smoke 1 pack/day.  She reports family history of coronary artery disease in her father.  She is unaware if there is any family history of familial hypercholesterolemia.  Reviewed outside records including EKG and labs.   Current Outpatient Medications on File Prior to Visit  Medication Sig Dispense Refill  . citalopram (CELEXA) 10 MG tablet Take 10 mg by mouth daily.    Marland Kitchen LORazepam (ATIVAN) 1 MG tablet Take 1 mg by mouth daily as needed.    Marland Kitchen losartan (COZAAR) 50 MG tablet Take 50 mg by mouth daily.    . Multiple Vitamin (MULTIVITAMIN) capsule Take 1 capsule by mouth daily.     No current  facility-administered medications on file prior to visit.    Cardiovascular and other pertinent studies:  Lexiscan/modified Bruce Tetrofosmin stress test 06/21/2020: Lexiscan/modified Bruce nuclear stress test performed using 1-day protocol. Stress EKG is non-diagnostic, as this is pharmacological stress test. In addition, stress EKG at 82% MPHR showed no ischemic changes.  Small sized, mild intensity, reversible perfusion defect in basal inferoseptal myocardium.  Low risk study.  Stress LVEF 69%.  Echocardiogram 06/20/2020:  Left ventricle cavity is normal in size and wall thickness. Normal global  wall motion. Normal LV systolic function with EF 55%. Doppler evidence of  grade I (impaired) diastolic dysfunction, normal LAP. Calculated EF 55%.  Trileaflet aortic valve. Trace aortic regurgitation.  Mild mitral annular calcification. Trace mitral stenosis. Moderate (Grade  II) mitral regurgitation.  Inadequate TR jet to estimate pulmonary artery systolic pressure. Normal  right atrial pressure.   ABI 06/19/2020:  This exam reveals mildly decreased perfusion of the right lower extremity,  noted at the anterior tibial and post tibial artery level (ABI 0.88) and  mildly decreased perfusion of the left lower extremity, noted at the post  tibial artery level (ABI 0.88). Mildly abnormal biphasic waveforms at the  bilateral AT.   CT Cardiac scoring 06/01/2020: Total score: 867 LM: 94 LCAD: 501 LCx: 0 RCA: 272  EKG 05/25/2020: Sinus rhythm 62 bpm Normal EKG  Recent labs: 06/22/2020: Chol 196, TG 111, HDL 62, LDL 114  05/15/2020: EGFR 73  Chol N/A, TG 160, HDL 68, LDL 207 TSH 2.9 normal   Review of Systems  Cardiovascular: Positive for chest pain and dyspnea on exertion. Negative for leg swelling, palpitations and syncope.         Vitals:   06/29/20 1554  BP: (!) 148/73  Pulse: 80  Resp: 16  SpO2: 96%     Body mass index is 23.52 kg/m. Filed Weights   06/29/20 1554   Weight: 137 lb (62.1 kg)     Objective:   Physical Exam Vitals and nursing note reviewed.  Constitutional:      General: She is not in acute distress. Neck:     Vascular: No JVD.  Cardiovascular:     Rate and Rhythm: Normal rate and regular rhythm.     Pulses:          Dorsalis pedis pulses are 0 on the right side and 0 on the left side.       Posterior tibial pulses are 1+ on the right side and 1+ on the left side.     Heart sounds: Normal heart sounds. No murmur heard.   Pulmonary:     Effort: Pulmonary effort is normal.     Breath sounds: Normal breath sounds. No wheezing or rales.         Assessment & Recommendations:   74 y.o. Caucasian female with hypertension, hyperlipidemia, tobacco dependence, coronary calcification.  Back pain: I remain skeptical that this is angina. Suspect musculoskeletal cause. That said, she does have CAD. See below.  CAD: Elevated calcium score and mildly abnormal stress test. Recommend medical management at this time with Aspirin 81 mg, high intensity statin. Added metoprolol tartarate 25 mg bid.  Hyperlipidemia: LDL down from 207 to 114 on lipitor 40 mg. Increased to 80 mg to further reduce LDL.  Abnormal peripheral pulse: Mildly abnormal ABI. Recommend risk factor modification, tobacco cessation, regular walking.  F/u in 6-8 weeks   Nigel Mormon, MD Pager: [phone] Office: [phone]

## 2020-07-14 ENCOUNTER — Institutional Professional Consult (permissible substitution): Payer: Medicare Other | Admitting: Pulmonary Disease

## 2020-07-19 NOTE — Progress Notes (Signed)
[facility] Note  07/24/2020     CHIEF COMPLAINT Patient presents for Retina Follow Up   HISTORY OF PRESENT ILLNESS: Brenda Franklin is a 74 y.o. female who presents to the clinic today for:   HPI    Retina Follow Up    Patient presents with  Wet AMD.  In both eyes.  This started 3 months ago.  I, the attending physician,  performed the HPI with the patient and updated documentation appropriately.          Comments    Patient here for 3 month retina follow up for non-exu ARMD OU. Patient states vision gets blurry last 2 - 3 weeks. When wears glasses vision good.        Last edited by Bernarda Caffey, MD on 07/24/2020  9:42 PM. (History)    pt is with her daughter today, she states pt has had a lot of aching in the left eye recently, she states the pts son passed away last month, so pt had been under a lot of stress and not sleeping  Referring physician: Lonia Skinner, MD [place],  [place] [phone]  HISTORICAL INFORMATION:   Selected notes from the [place]: No current outpatient medications on file. (Ophthalmic Drugs)   No current facility-administered medications for this visit. (Ophthalmic Drugs)   Current Outpatient Medications (Other)  Medication Sig   atorvastatin (LIPITOR) 80 MG tablet Take 1 tablet (80 mg total) by mouth daily.   citalopram (CELEXA) 10 MG tablet Take 10 mg by mouth daily.   LORazepam (ATIVAN) 1 MG tablet Take 1 mg by mouth daily as needed.   losartan (COZAAR) 50 MG tablet Take 50 mg by mouth daily.   metoprolol tartrate (LOPRESSOR) 25 MG tablet Take 1 tablet (25 mg total) by mouth 2 (two) times daily.   Multiple Vitamin (MULTIVITAMIN) capsule Take 1 capsule by mouth daily.   No current facility-administered medications for this visit. (Other)      REVIEW OF SYSTEMS: ROS    Positive for: Neurological, Skin, Eyes   Negative for: Constitutional,  Gastrointestinal, Genitourinary, Musculoskeletal, HENT, Endocrine, Cardiovascular, Respiratory, Psychiatric, Allergic/Imm, Heme/Lymph   Last edited by Theodore Demark, COA on 07/24/2020  3:15 PM. (History)       ALLERGIES Allergies  Allergen Reactions   Tolmetin Nausea And Vomiting   Codeine Nausea And Vomiting and Other (See Comments)    Upset stomach   Milk-Related Compounds Diarrhea   Nsaids Diarrhea and Other (See Comments)    Stomach upset   Oxycodone Nausea And Vomiting    PAST MEDICAL HISTORY Past Medical History:  Diagnosis Date   BCC (basal cell carcinoma) 12/09/2012   right inner eye ([id number])   Cataracts, bilateral    Compression fracture of fifth lumbar vertebra (HCC)    Depression    Extremity pain    High cholesterol    Past Surgical History:  Procedure Laterality Date   ABDOMINAL HYSTERECTOMY     BREAST ENHANCEMENT SURGERY     COLONOSCOPY     ESOPHAGEAL DILATION     EXCISION VAGINAL CYST     HEMORRHOID SURGERY     metal stimulator for bowel control     Removed   NOSE SURGERY      FAMILY HISTORY Family History  Problem Relation Age of Onset   Other Mother        Intestinal problem -  gangrene   Cancer Father        Bile Duct Cancer   Heart disease Father    Heart attack Father    Hypercholesterolemia Father    Kidney cancer Sister    Kidney cancer Brother     SOCIAL HISTORY Social History   Tobacco Use   Smoking status: Current Every Day Smoker    Packs/day: 0.75    Types: Cigarettes   Smokeless tobacco: Never Used  Vaping Use   Vaping Use: Never used  Substance Use Topics   Alcohol use: No   Drug use: No         OPHTHALMIC EXAM:  Base Eye Exam    Visual Acuity (Snellen - Linear)      Right Left   Dist [place] 20/25 20/60 -2   Dist ph [place] NI 20/30 -2       Tonometry (Tonopen, 3:12 PM)      Right Left   Pressure 14 15       Pupils      Dark Light Shape React APD   Right 2 1 Round Brisk None    Left 2 1 Round Brisk None       Visual Fields (Counting fingers)      Left Right    Full Full       Extraocular Movement      Right Left    Full Full       Neuro/Psych    Oriented x3: Yes   Mood/Affect: Normal       Dilation    Both eyes: 1.0% Mydriacyl, 2.5% Phenylephrine @ 3:12 PM        Slit Lamp and Fundus Exam    Slit Lamp Exam      Right Left   Lids/Lashes Dermato Dermato   Conjunctiva/Sclera White and quiet White and quiet   Cornea Arcus, trace PEE Arcus, trace tear film debris   Anterior Chamber Deep and clear Deep and clear   Iris Round and moderately dilated Round and moderately dilated   Lens 3-4+ NS w/brunescence; 3+ cortical 3-4+ NS w/brunescence; 3+ cortical   Vitreous Synerisis Synerisis       Fundus Exam      Right Left   Disc Pink, sharp, compact. Pink, sharp, compact.  Mild temporal PPA.   C/D Ratio 0.1 0.1   Macula flat, Blunted foveal reflex, RPE mottling and clumping, Drusen, No heme or edema Blunted foveal reflex, Drusen, RPE mottling and clumping, No heme or edema.   Vessels Attenuated, tortuous. Attenuated, tortuous.  Mild AV crossing changes.,   Periphery Attached.  Reticular degeneration.  No heme. Attached.  Reticular degeneration, No heme           IMAGING AND PROCEDURES  Imaging and Procedures for 07/24/2020  OCT, Retina - OU - Both Eyes       Right Eye Quality was good. Central Foveal Thickness: 251. Progression has been stable. Findings include normal foveal contour, no IRF, no SRF, retinal drusen .   Left Eye Quality was good. Central Foveal Thickness: 255. Progression has been stable. Findings include normal foveal contour, no IRF, no SRF, retinal drusen .   Notes *Images captured and stored on drive  Diagnosis / Impression:  Non-exu ARMD OU - stable  Clinical management:  See below  Abbreviations: NFP - Normal foveal profile. CME - cystoid macular edema. PED - pigment epithelial detachment. IRF - intraretinal  fluid. SRF - subretinal fluid. EZ - ellipsoid zone. ERM -  epiretinal membrane. ORA - outer retinal atrophy. ORT - outer retinal tubulation. SRHM - subretinal hyper-reflective material. IRHM - intraretinal hyper-reflective material                 ASSESSMENT/PLAN:    ICD-10-CM   1. Early dry stage nonexudative age-related macular degeneration of both eyes  H35.3131   2. Retinal edema  H35.81 OCT, Retina - OU - Both Eyes  3. Essential hypertension  I10   4. Hypertensive retinopathy of both eyes  H35.033   5. Combined forms of age-related cataract of both eyes  H25.813     1. Age related macular degeneration, non-exudative, both eyes   - early stage w/ mild drusen -- stable  - The incidence, anatomy, and pathology of dry AMD, risk of progression, and the AREDS and AREDS 2 study including smoking risks discussed with patient.  - Recommend amsler grid monitoring             - F/u in 6 mos  2. No retinal edema  3,4. Hypertensive retinopathy OU - discussed importance of tight BP control. - BP in office 7.20.21: 156/72 - Recommend seeing a PCP for eval and management - monitor  5. Mixed Cataract OU - The symptoms of cataract, surgical options, and treatments and risks were discussed with patient. - discussed diagnosis and progression - Visually significant - Cataract sx OU was recommended to pt yrs ago - under the expert management of Dr. Eulas Post -- no surgery date scheduled yet  6. Dry eyes OU - recommend artificial tears and lubricating ointment as needed - Most likely cause for most recent flare-up of blurred vision OS   Ophthalmic Meds Ordered this visit:  No orders of the defined types were placed in this encounter.      Return in about 6 months (around 01/22/2021) for f/u non-exu ARMD OU, DFE, OCT.  There are no Patient Instructions on file for this visit.  This document serves as a record of services personally performed by Gardiner Sleeper, MD, PhD. It was created  on their behalf by Leeann Must, [place], an ophthalmic technician. The creation of this record is the provider's dictation and/or activities during the visit.    Electronically signed by: Leeann Must, COA @TODAY @ 9:46 PM  Gardiner Sleeper, M.D., Ph.D. Diseases & Surgery of the Retina and Vitreous [facility] 07/24/2020   I have reviewed the above documentation for accuracy and completeness, and I agree with the above. Gardiner Sleeper, M.D., Ph.D. 07/24/20 9:46 PM   Abbreviations: M myopia (nearsighted); A astigmatism; H hyperopia (farsighted); P presbyopia; Mrx spectacle prescription;  CTL contact lenses; OD right eye; OS left eye; OU both eyes  XT exotropia; ET esotropia; PEK punctate epithelial keratitis; PEE punctate epithelial erosions; DES dry eye syndrome; MGD meibomian gland dysfunction; ATs artificial tears; PFAT's preservative free artificial tears; [place] nuclear sclerotic cataract; PSC posterior subcapsular cataract; ERM epi-retinal membrane; PVD posterior vitreous detachment; RD retinal detachment; DM diabetes mellitus; DR diabetic retinopathy; NPDR non-proliferative diabetic retinopathy; PDR proliferative diabetic retinopathy; CSME clinically significant macular edema; DME diabetic macular edema; dbh dot blot hemorrhages; CWS cotton wool spot; POAG primary open angle glaucoma; C/D cup-to-disc ratio; HVF humphrey visual field; GVF goldmann visual field; OCT optical coherence tomography; IOP intraocular pressure; BRVO Branch retinal vein occlusion; CRVO central retinal vein occlusion; CRAO central retinal artery occlusion; BRAO branch retinal artery occlusion; RT retinal tear; SB scleral buckle; PPV pars plana vitrectomy; VH Vitreous hemorrhage;  PRP panretinal laser photocoagulation; IVK intravitreal kenalog; VMT vitreomacular traction; MH Macular hole;  NVD neovascularization of the disc; NVE neovascularization elsewhere; AREDS age related eye disease study; ARMD age  related macular degeneration; POAG primary open angle glaucoma; EBMD epithelial/anterior basement membrane dystrophy; ACIOL anterior chamber intraocular lens; IOL intraocular lens; PCIOL posterior chamber intraocular lens; Phaco/IOL phacoemulsification with intraocular lens placement; [place] photorefractive keratectomy; LASIK laser assisted in situ keratomileusis; HTN hypertension; DM diabetes mellitus; COPD chronic obstructive pulmonary disease

## 2020-07-20 NOTE — Progress Notes (Signed)
   Follow-Up Visit   Subjective  Brenda Franklin is a 74 y.o. female who presents for the following: Cyst (LEFT NECK X 6 YEARS).  Epidermal cyst/ skin exam Location:  Duration:  Quality:  Associated Signs/Symptoms: Modifying Factors:  Severity:  Timing: Context:   Objective  Well appearing patient in no apparent distress; mood and affect are within normal limits.  A full examination was performed including scalp, head, eyes, ears, nose, lips, neck, chest, axillae, abdomen, back, buttocks, bilateral upper extremities, bilateral lower extremities, hands, feet, fingers, toes, fingernails, and toenails. All findings within normal limits unless otherwise noted below.   Assessment & Plan     Several issues discussed today with Brenda Franklin date of birth 12/24/1945.  She is embarrassed by having to visible facial cysts, the larger one being along the posterior left mandible and a smaller one below the right mid mandible.  I explained that these are almost certainly benign epidermoid cysts and removal would be done at her choosing.  I encouraged her to check with her [employer] (we will provide CPT codes) to see if this would be a covered procedure.  This is done in the office with a local numbing and would require that I have an hour surgical time available.  On the left temple is a brown textured thickening called a seborrheic keratosis which is absolutely benign.  In the middle of her back is a hornlike 4 mm flesh-colored lesion which is somewhat bothersome.  Because she has multiple doctor visits and possible procedure scheduled this week, Brenda Franklin chose to leave the lesion on the back for the time being.  There were no atypical moles or skin cancers on any area that examined.  Follow-up can be scheduled as a surgical if Brenda Franklin chooses.   Epidermal cyst (2) Left Anterior Mandible; Right Anterior Mandible  Patient can choose to have them removed. Patient will need a 1 hour surgery. Encourage to  check her insurance coverage.  Seborrheic keratosis Left Temple  No treatment needed benign lesion.   Screening for malignant neoplasm of skin Mid Back  Follow up with yearly skin exams.   Skin cancer screening performed today.    I, Lavonna Monarch, MD, have reviewed all documentation for this visit.  The documentation on 07/23/20 for the exam, diagnosis, procedures, and orders are all accurate and complete.

## 2020-07-23 ENCOUNTER — Encounter: Payer: Self-pay | Admitting: Dermatology

## 2020-07-24 ENCOUNTER — Other Ambulatory Visit: Payer: Self-pay

## 2020-07-24 ENCOUNTER — Encounter (INDEPENDENT_AMBULATORY_CARE_PROVIDER_SITE_OTHER): Payer: Medicare Other | Admitting: Ophthalmology

## 2020-07-24 ENCOUNTER — Encounter (INDEPENDENT_AMBULATORY_CARE_PROVIDER_SITE_OTHER): Payer: Self-pay | Admitting: Ophthalmology

## 2020-07-24 ENCOUNTER — Ambulatory Visit (INDEPENDENT_AMBULATORY_CARE_PROVIDER_SITE_OTHER): Payer: Medicare Other | Admitting: Ophthalmology

## 2020-07-24 DIAGNOSIS — H35033 Hypertensive retinopathy, bilateral: Secondary | ICD-10-CM

## 2020-07-24 DIAGNOSIS — H353131 Nonexudative age-related macular degeneration, bilateral, early dry stage: Secondary | ICD-10-CM

## 2020-07-24 DIAGNOSIS — H25813 Combined forms of age-related cataract, bilateral: Secondary | ICD-10-CM

## 2020-07-24 DIAGNOSIS — H3581 Retinal edema: Secondary | ICD-10-CM

## 2020-07-24 DIAGNOSIS — I10 Essential (primary) hypertension: Secondary | ICD-10-CM | POA: Diagnosis not present

## 2020-07-25 ENCOUNTER — Encounter: Payer: Self-pay | Admitting: Internal Medicine

## 2020-07-25 ENCOUNTER — Ambulatory Visit (INDEPENDENT_AMBULATORY_CARE_PROVIDER_SITE_OTHER): Payer: Medicare Other | Admitting: Internal Medicine

## 2020-07-25 VITALS — BP 122/74 | HR 56 | Temp 97.3°F | Ht 64.0 in | Wt 132.0 lb

## 2020-07-25 DIAGNOSIS — R0602 Shortness of breath: Secondary | ICD-10-CM

## 2020-07-25 DIAGNOSIS — R053 Chronic cough: Secondary | ICD-10-CM

## 2020-07-25 DIAGNOSIS — R0600 Dyspnea, unspecified: Secondary | ICD-10-CM | POA: Diagnosis not present

## 2020-07-25 DIAGNOSIS — R0609 Other forms of dyspnea: Secondary | ICD-10-CM

## 2020-07-25 DIAGNOSIS — R931 Abnormal findings on diagnostic imaging of heart and coronary circulation: Secondary | ICD-10-CM | POA: Diagnosis not present

## 2020-07-25 DIAGNOSIS — S29012A Strain of muscle and tendon of back wall of thorax, initial encounter: Secondary | ICD-10-CM

## 2020-07-25 MED ORDER — SPIRIVA RESPIMAT 1.25 MCG/ACT IN AERS: 2.0000 | INHALATION_SPRAY | Freq: Every day | RESPIRATORY_TRACT | 0 refills | Status: DC

## 2020-07-25 NOTE — Progress Notes (Signed)
OV 07/25/2020  Subjective:  Patient ID: Brenda Franklin, female , DOB: 1946-06-08 , age 74 y.o. , MRN: [id number] , ADDRESS: [address] [place] [address] PCP [name], MD Patient Care Team: [name], MD as PCP - General (Internal Medicine) Lavonna Monarch, MD as Consulting Physician (Dermatology)  This Provider for this visit: Treatment Team:  Attending Provider: Margaretha Seeds, MD    07/25/2020 -   Chief Complaint  Patient presents with  . Consult    SOB at times, back pain    Brenda Franklin 74 y.o. -74 year old female accompanied by her daughter Brenda Franklin.  Patient is originally from [place].  She relocated to [place] at the time of divorce with her 4 kids.  And her daughter Brenda Franklin relocated to [place] and the patient accompanied her.  Patient I think lives with the daughter.  She has hypertension, hyperlipidemia tobacco dependence and coronary artery calcification.  They have been referred for pulmonary evaluation because of shortness of breath and infrascapular pain and very mild early morning cough.  Cardiologist Dr. Virgina Jock.  History as best as I can gather is that for the last 1 year she has had this infrascapular pain that is deep and gnawing.  This happens intermittently but daily.  Over the course of the last 1 year the frequency and severity slightly more but overall is mild to moderate in severity.  When she rests it gets better.  They did see the cardiologist.  She had cardiac stress test June 21, 2020 low risk study stress ejection fraction 69%.  She also had echocardiogram June 20, 2020: It appears normal.  Her EKG is reported as normal.  She continues to smoke.  In the background it appears for the last few years she has had insidious onset of shortness of breath particularly with heavy exertion although she is able to do her ADLs.  Slight early morning cough.  They are not sure of the shortness of breath itself is gotten worse with time but possibly  so.  Review of her the labs indicate a chest x-ray August 2019: To me looks hyperinflated I personally visualized this.  Unclear if there is any findings of ILD.  Simple office walk 185 feet x  3 laps goal with forehead probe 07/25/2020   O2 used ra  Number laps completed 3  Comments about pace average  Resting Pulse Ox/HR 97% and 80/min  Final Pulse Ox/HR 96% and 88/min  Desaturated </= 88% no  Desaturated <= 3% points no  Got Tachycardic >/= 90/min no  Symptoms at end of test No dyspnea, just back pain  Miscellaneous comments Talked duirng walk       HPI      ROS - per HPI     has a past medical history of BCC (basal cell carcinoma) (12/09/2012), Cataracts, bilateral, Compression fracture of fifth lumbar vertebra ([place]), Depression, Extremity pain, and High cholesterol.   reports that she has been smoking cigarettes. She has been smoking about 1.00 pack per day. She has never used smokeless tobacco.  Past Surgical History:  Procedure Laterality Date  . ABDOMINAL HYSTERECTOMY    . BREAST ENHANCEMENT SURGERY    . COLONOSCOPY    . ESOPHAGEAL DILATION    . EXCISION VAGINAL CYST    . HEMORRHOID SURGERY    . metal stimulator for bowel control     Removed  . NOSE SURGERY      Allergies  Allergen Reactions  .  Tolmetin Nausea And Vomiting  . Codeine Nausea And Vomiting and Other (See Comments)    Upset stomach  . Milk-Related Compounds Diarrhea  . Nsaids Diarrhea and Other (See Comments)    Stomach upset  . Other Diarrhea  . Oxycodone Nausea And Vomiting    Immunization History  Administered Date(s) Administered  . Rabies, IM 12/05/2015, 12/05/2015, 12/07/2015, 12/11/2015, 12/18/2015    Family History  Problem Relation Age of Onset  . Other Mother        Intestinal problem - gangrene  . Cancer Father        Bile Duct Cancer  . Heart disease Father   . Heart attack Father   . Hypercholesterolemia Father   . Kidney cancer Sister   . Kidney cancer  Brother      Current Outpatient Medications:  .  albuterol (VENTOLIN HFA) 108 (90 Base) MCG/ACT inhaler, SMARTSIG:1 Puff(s) By Mouth Every 4 Hours PRN, Disp: , Rfl:  .  atorvastatin (LIPITOR) 80 MG tablet, Take 1 tablet (80 mg total) by mouth daily., Disp: 60 tablet, Rfl: 2 .  citalopram (CELEXA) 10 MG tablet, Take 10 mg by mouth daily., Disp: , Rfl:  .  LORazepam (ATIVAN) 1 MG tablet, Take 1 mg by mouth daily as needed., Disp: , Rfl:  .  losartan (COZAAR) 50 MG tablet, Take 50 mg by mouth daily., Disp: , Rfl:  .  metoprolol tartrate (LOPRESSOR) 25 MG tablet, Take 1 tablet (25 mg total) by mouth 2 (two) times daily., Disp: 120 tablet, Rfl: 3 .  Multiple Vitamin (MULTIVITAMIN) capsule, Take 1 capsule by mouth daily., Disp: , Rfl:       Objective:   Vitals:   07/25/20 1439  BP: 122/74  Pulse: (!) 56  Temp: (!) 97.3 F (36.3 C)  TempSrc: Oral  SpO2: 96%  Weight: 132 lb (59.9 kg)  Height: 5\' 4"  (1.626 m)    Estimated body mass index is 22.66 kg/m as calculated from the following:   Height as of this encounter: 5\' 4"  (1.626 m).   Weight as of this encounter: 132 lb (59.9 kg).  @WEIGHTCHANGE @  [employer]   07/25/20 1439  Weight: 132 lb (59.9 kg)     Physical Exam  General Appearance:    Alert, cooperative, no distress, appears stated age - yes , Deconditioned looking - no , OBESE  - no, Sitting on Wheelchair -  no  Head:    Normocephalic, without obvious abnormality, atraumatic  Eyes:    PERRL, conjunctiva/corneas clear,  Ears:    Normal TM's and external ear canals, both ears  Nose:   Nares normal, septum midline, mucosa normal, no drainage    or sinus tenderness. OXYGEN ON  - no . Patient is @ ra   Throat:   Lips, mucosa, and tongue normal; teeth and gums normal. Cyanosis on lips - no  Neck:   Supple, symmetrical, trachea midline, no adenopathy;    thyroid:  no enlargement/tenderness/nodules; no carotid   bruit or JVD  Back:     Symmetric, no curvature, ROM normal,  no CVA tenderness  Lungs:     Distress - no , Wheeze no, Barrell Chest - no, Purse lip breathing - no, Crackles - no   Chest Wall:    No tenderness or deformity.    Heart:    Regular rate and rhythm, S1 and S2 normal, no rub   or gallop, Murmur - no  Breast Exam:    NOT DONE  Abdomen:  Soft, non-tender, bowel sounds active all four quadrants,    no masses, no organomegaly. Visceral obesity - no  Genitalia:   NOT DONE  Rectal:   NOT DONE  Extremities:   Extremities - normal, Has Cane - no, Clubbing - no, Edema - no  Pulses:   2+ and symmetric all extremities  Skin:   Stigmata of Connective Tissue Disease - no  Lymph nodes:   Cervical, supraclavicular, and axillary nodes normal  Psychiatric:  Neurologic:   Pleasant - yes, Anxious - no, Flat affect - no  CAm-ICU - neg, Alert and Oriented x 3 - yes, Moves all 4s - yes, Speech - normal, Cognition - intact         Assessment:       ICD-10-CM   1. Dyspnea on exertion  R06.00   2. Chronic cough  R05.3   3. Upper back strain, initial encounter  S29.012A        Plan:     Patient Instructions     ICD-10-CM   1. Dyspnea on exertion  R06.00   2. Chronic cough  R05.3   3. Upper back strain, initial encounter  S29.012A      Unclear reason for symptoms COPD is a candidate disease Pulmonary Fibrosis need to be ruled out Spinal issues are a possibility  Plan  - try empiric spiriva respimat 2 puff daily  - continue albuterol but use as needed  - do HRCT supine and prone  - do full PFT  Followup  - return to see app next few to several weeks but after completing above - to discuss test results and next steps     SIGNATURE    Dr. Brand Males, M.D., F.C.C.P,  Pulmonary and Critical Care Medicine Staff Physician, [place] Director - Interstitial Lung Disease  Program  [facility] [place], [place], [address]  Pager: [phone], If no answer  or between  15:00h - 7:00h: call [phone] Telephone: [phone]  3:27 PM 07/25/2020

## 2020-07-25 NOTE — Patient Instructions (Signed)
ICD-10-CM   1. Dyspnea on exertion  R06.00   2. Chronic cough  R05.3   3. Upper back strain, initial encounter  S29.012A      Unclear reason for symptoms COPD is a candidate disease Pulmonary Fibrosis need to be ruled out Spinal issues are a possibility  Plan  - try empiric spiriva respimat 2 puff daily  - continue albuterol but use as needed  - do HRCT supine and prone  - do full PFT  Followup  - return to see app next few to several weeks but after completing above - to discuss test results and next steps

## 2020-07-25 NOTE — Addendum Note (Signed)
Addended by: Coralie Keens on: 07/25/2020 05:13 PM   Modules accepted: Orders

## 2020-07-25 NOTE — Addendum Note (Signed)
Addended by: Coralie Keens on: 07/25/2020 05:16 PM   Modules accepted: Orders

## 2020-07-31 DIAGNOSIS — H2512 Age-related nuclear cataract, left eye: Secondary | ICD-10-CM | POA: Diagnosis not present

## 2020-07-31 DIAGNOSIS — H2513 Age-related nuclear cataract, bilateral: Secondary | ICD-10-CM | POA: Diagnosis not present

## 2020-08-03 ENCOUNTER — Institutional Professional Consult (permissible substitution): Payer: Medicare Other | Admitting: Internal Medicine

## 2020-08-09 ENCOUNTER — Other Ambulatory Visit: Payer: Self-pay

## 2020-08-09 ENCOUNTER — Ambulatory Visit
Admission: RE | Admit: 2020-08-09 | Discharge: 2020-08-09 | Disposition: A | Discharge status: Home / Self Care | Payer: Medicare Other | Source: Ambulatory Visit | Attending: Internal Medicine | Admitting: Internal Medicine

## 2020-08-09 DIAGNOSIS — R0602 Shortness of breath: Secondary | ICD-10-CM

## 2020-08-09 DIAGNOSIS — R0689 Other abnormalities of breathing: Secondary | ICD-10-CM | POA: Diagnosis not present

## 2020-08-09 DIAGNOSIS — R0600 Dyspnea, unspecified: Secondary | ICD-10-CM | POA: Diagnosis not present

## 2020-08-09 DIAGNOSIS — I2510 Atherosclerotic heart disease of native coronary artery without angina pectoris: Secondary | ICD-10-CM | POA: Diagnosis not present

## 2020-08-09 DIAGNOSIS — J8410 Pulmonary fibrosis, unspecified: Secondary | ICD-10-CM | POA: Diagnosis not present

## 2020-08-14 ENCOUNTER — Other Ambulatory Visit: Payer: Self-pay | Admitting: Internal Medicine

## 2020-08-14 DIAGNOSIS — R0602 Shortness of breath: Secondary | ICD-10-CM

## 2020-08-14 NOTE — Progress Notes (Signed)
No ILD. Has small air trapping. Has Coronary artery calcifications. Has Mitral valve calcifications, Plan -> pls have her do echo before she sees Derl Barrow for followup later this month. Thanks MR

## 2020-08-15 ENCOUNTER — Telehealth: Payer: Self-pay | Admitting: Internal Medicine

## 2020-08-15 ENCOUNTER — Other Ambulatory Visit: Payer: Medicare Other

## 2020-08-15 DIAGNOSIS — H5703 Miosis: Secondary | ICD-10-CM | POA: Diagnosis not present

## 2020-08-15 DIAGNOSIS — H2512 Age-related nuclear cataract, left eye: Secondary | ICD-10-CM | POA: Diagnosis not present

## 2020-08-15 NOTE — Telephone Encounter (Signed)
Thank you for that information.  Apologize.  I did not see it in the usual place for echocardiogram in epic.  This because it was done by [facility] and I found it in another section of epic.  The results are below.  At this point in time nothing cardiac is needed from my perspective.  Patient will maintain follow-up with Derl Barrow coming up   Lexiscan/modified Bruce Tetrofosmin stress test 06/21/2020: Lexiscan/modified Bruce nuclear stress test performed using 1-day protocol. Stress EKG is non-diagnostic, as this is pharmacological stress test. In addition, stress EKG at 82% MPHR showed no ischemic changes.  Small sized, mild intensity, reversible perfusion defect in basal inferoseptal myocardium.  Low risk study.  Stress LVEF 69%.   Echocardiogram 06/20/2020:  Left ventricle cavity is normal in size and wall thickness. Normal global  wall motion. Normal LV systolic function with EF 55%. Doppler evidence of  grade I (impaired) diastolic dysfunction, normal LAP. Calculated EF 55%.  Trileaflet aortic valve. Trace aortic regurgitation.  Mild mitral annular calcification. Trace mitral stenosis. Moderate (Grade  II) mitral regurgitation.  Inadequate TR jet to estimate pulmonary artery systolic pressure. Normal  right atrial pressure.

## 2020-08-15 NOTE — Telephone Encounter (Signed)
No ILD. Has small air trapping. Has Coronary artery calcifications. Has Mitral valve calcifications, Plan -> pls have her do echo before she sees Derl Barrow for followup later this month. Thanks MR    Called and spoke with pts daughter and she stated that her mother just had an echo done by cardiology on 06/20/20.   The results are in epic.  MR did you want her to have this new one or is it ok to cancel that order?

## 2020-08-21 ENCOUNTER — Institutional Professional Consult (permissible substitution): Payer: Medicare Other | Admitting: Pulmonary Disease

## 2020-08-21 ENCOUNTER — Other Ambulatory Visit (HOSPITAL_COMMUNITY): Payer: Medicare Other

## 2020-08-27 DIAGNOSIS — H16102 Unspecified superficial keratitis, left eye: Secondary | ICD-10-CM | POA: Diagnosis not present

## 2020-08-27 DIAGNOSIS — H5712 Ocular pain, left eye: Secondary | ICD-10-CM | POA: Diagnosis not present

## 2020-08-27 DIAGNOSIS — H18232 Secondary corneal edema, left eye: Secondary | ICD-10-CM | POA: Diagnosis not present

## 2020-08-27 DIAGNOSIS — H538 Other visual disturbances: Secondary | ICD-10-CM | POA: Diagnosis not present

## 2020-08-28 ENCOUNTER — Ambulatory Visit (INDEPENDENT_AMBULATORY_CARE_PROVIDER_SITE_OTHER): Payer: Medicare Other | Admitting: Primary Care

## 2020-08-28 ENCOUNTER — Ambulatory Visit (INDEPENDENT_AMBULATORY_CARE_PROVIDER_SITE_OTHER): Payer: Medicare Other | Admitting: Internal Medicine

## 2020-08-28 ENCOUNTER — Encounter: Payer: Self-pay | Admitting: Primary Care

## 2020-08-28 ENCOUNTER — Other Ambulatory Visit: Payer: Self-pay

## 2020-08-28 VITALS — BP 122/72 | HR 62 | Temp 97.5°F | Ht 62.0 in | Wt 138.0 lb

## 2020-08-28 DIAGNOSIS — R0600 Dyspnea, unspecified: Secondary | ICD-10-CM

## 2020-08-28 DIAGNOSIS — R0609 Other forms of dyspnea: Secondary | ICD-10-CM

## 2020-08-28 DIAGNOSIS — R0602 Shortness of breath: Secondary | ICD-10-CM | POA: Diagnosis not present

## 2020-08-28 DIAGNOSIS — I2510 Atherosclerotic heart disease of native coronary artery without angina pectoris: Secondary | ICD-10-CM

## 2020-08-28 DIAGNOSIS — R053 Chronic cough: Secondary | ICD-10-CM

## 2020-08-28 LAB — PULMONARY FUNCTION TEST
DL/VA % pred: 129 %
DL/VA: 5.3 ml/min/mmHg/L
DLCO cor % pred: 99 %
DLCO cor: 19.05 ml/min/mmHg
DLCO unc % pred: 99 %
DLCO unc: 19.05 ml/min/mmHg
FEF 25-75 Post: 2.04 L/sec
FEF 25-75 Pre: 1.38 L/sec
FEF2575-%Change-Post: 48 %
FEF2575-%Pred-Post: 126 %
FEF2575-%Pred-Pre: 85 %
FEV1-%Change-Post: 12 %
FEV1-%Pred-Post: 80 %
FEV1-%Pred-Pre: 71 %
FEV1-Post: 1.68 L
FEV1-Pre: 1.49 L
FEV1FVC-%Change-Post: 2 %
FEV1FVC-%Pred-Pre: 103 %
FEV6-%Change-Post: 9 %
FEV6-%Pred-Post: 79 %
FEV6-%Pred-Pre: 73 %
FEV6-Post: 2.1 L
FEV6-Pre: 1.93 L
FEV6FVC-%Pred-Post: 105 %
FEV6FVC-%Pred-Pre: 105 %
FVC-%Change-Post: 10 %
FVC-%Pred-Post: 76 %
FVC-%Pred-Pre: 69 %
FVC-Post: 2.12 L
FVC-Pre: 1.93 L
Post FEV1/FVC ratio: 79 %
Post FEV6/FVC ratio: 100 %
Pre FEV1/FVC ratio: 77 %
Pre FEV6/FVC Ratio: 100 %
RV % pred: 195 %
RV: 4.52 L
TLC % pred: 134 %
TLC: 6.82 L

## 2020-08-28 LAB — POCT EXHALED NITRIC OXIDE: FeNO level (ppb): 11

## 2020-08-28 MED ORDER — SALINE SPRAY 0.65 % NA SOLN: 1.0000 | NASAL | 2 refills | Status: DC | PRN

## 2020-08-28 MED ORDER — BUDESONIDE-FORMOTEROL FUMARATE 80-4.5 MCG/ACT IN AERO: 2.0000 | INHALATION_SPRAY | Freq: Two times a day (BID) | RESPIRATORY_TRACT | 5 refills | Status: DC

## 2020-08-28 NOTE — Patient Instructions (Addendum)
Pulmonary function testing showed mild restriction with positive bronchodilator response, consistent with asthma   CT chest showed no evidence of interstitial lung disease (pulmonary fibrosis), small airway trapping and coronary artery calcifications  Echocardiogram in September 2021 showed normal Left ventricular systolic function with Ejection fraction 55%, doppler evidence of grade 1 diastolic dysfunction. Mild mitral annular calcification, trace stenosis. Moderate grade 2 mitral regurgitation.   Recommendations: - Start Symbicort 80 two puffs morning and evening- rinse mouth after use (This is a maintenance inhaler used to treat asthma. Do not take more than prescribed) - Continue Albuterol 2 puffs every 4-6 hours as needed for breakthrough shortness of breath (this is your rescue inhaler) - Ocean/saline 1 puffs per nostril twice daily  Follow-up: - 3 months with Dr. Chase Caller or sooner if needed  - Continue to follow with cardiology    Asthma, Adult  Asthma is a long-term (chronic) condition in which the airways get tight and narrow. The airways are the breathing passages that lead from the nose and mouth down into the lungs. A person with asthma will have times when symptoms get worse. These are called asthma attacks. They can cause coughing, whistling sounds when you breathe (wheezing), shortness of breath, and chest pain. They can make it hard to breathe. There is no cure for asthma, but medicines and lifestyle changes can help control it. There are many things that can bring on an asthma attack or make asthma symptoms worse (triggers). Common triggers include:  Mold.  Dust.  Cigarette smoke.  Cockroaches.  Things that can cause allergy symptoms (allergens). These include animal skin flakes (dander) and pollen from trees or grass.  Things that pollute the air. These may include household cleaners, wood smoke, smog, or chemical odors.  Cold air, weather changes, and  wind.  Crying or laughing hard.  Stress.  Certain medicines or drugs.  Certain foods such as dried fruit, potato chips, and grape juice.  Infections, such as a cold or the flu.  Certain medical conditions or diseases.  Exercise or tiring activities. Asthma may be treated with medicines and by staying away from the things that cause asthma attacks. Types of medicines may include:  Controller medicines. These help prevent asthma symptoms. They are usually taken every day.  Fast-acting reliever or rescue medicines. These quickly relieve asthma symptoms. They are used as needed and provide short-term relief.  Allergy medicines if your attacks are brought on by allergens.  Medicines to help control the body's defense (immune) system. Follow these instructions at home: Avoiding triggers in your home  Change your heating and air conditioning filter often.  Limit your use of fireplaces and wood stoves.  Get rid of pests (such as roaches and mice) and their droppings.  Throw away plants if you see mold on them.  Clean your floors. Dust regularly. Use cleaning products that do not smell.  Have someone vacuum when you are not home. Use a vacuum cleaner with a HEPA filter if possible.  Replace carpet with wood, tile, or vinyl flooring. Carpet can trap animal skin flakes and dust.  Use allergy-proof pillows, mattress covers, and box spring covers.  Wash bed sheets and blankets every week in hot water. Dry them in a dryer.  Keep your bedroom free of any triggers.  Avoid pets and keep windows closed when things that cause allergy symptoms are in the air.  Use blankets that are made of polyester or cotton.  Clean bathrooms and kitchens with bleach. If possible,  have someone repaint the walls in these rooms with mold-resistant paint. Keep out of the rooms that are being cleaned and painted.  Wash your hands often with soap and water. If soap and water are not available, use hand  sanitizer.  Do not allow anyone to smoke in your home. General instructions  Take over-the-counter and prescription medicines only as told by your doctor. ? Talk with your doctor if you have questions about how or when to take your medicines. ? Make note if you need to use your medicines more often than usual.  Do not use any products that contain nicotine or tobacco, such as cigarettes and e-cigarettes. If you need help quitting, ask your doctor.  Stay away from secondhand smoke.  Avoid doing things outdoors when allergen counts are high and when air quality is low.  Wear a ski mask when doing outdoor activities in the winter. The mask should cover your nose and mouth. Exercise indoors on cold days if you can.  Warm up before you exercise. Take time to cool down after exercise.  Use a peak flow meter as told by your doctor. A peak flow meter is a tool that measures how well the lungs are working.  Keep track of the peak flow meter's readings. Write them down.  Follow your asthma action plan. This is a written plan for taking care of your asthma and treating your attacks.  Make sure you get all the shots (vaccines) that your doctor recommends. Ask your doctor about a flu shot and a pneumonia shot.  Keep all follow-up visits as told by your doctor. This is important. Contact a doctor if:  You have wheezing, shortness of breath, or a cough even while taking medicine to prevent attacks.  The mucus you cough up (sputum) is thicker than usual.  The mucus you cough up changes from clear or white to yellow, green, gray, or bloody.  You have problems from the medicine you are taking, such as: ? A rash. ? Itching. ? Swelling. ? Trouble breathing.  You need reliever medicines more than 2-3 times a week.  Your peak flow reading is still at 50-79% of your personal best after following the action plan for 1 hour.  You have a fever. Get help right away if:  You seem to be worse and  are not responding to medicine during an asthma attack.  You are short of breath even at rest.  You get short of breath when doing very little activity.  You have trouble eating, drinking, or talking.  You have chest pain or tightness.  You have a fast heartbeat.  Your lips or fingernails start to turn blue.  You are light-headed or dizzy, or you faint.  Your peak flow is less than 50% of your personal best.  You feel too tired to breathe normally. Summary  Asthma is a long-term (chronic) condition in which the airways get tight and narrow. An asthma attack can make it hard to breathe.  Asthma cannot be cured, but medicines and lifestyle changes can help control it.  Make sure you understand how to avoid triggers and how and when to use your medicines. This information is not intended to replace advice given to you by your health care provider. Make sure you discuss any questions you have with your health care provider. Document Revised: 11/19/2018 Document Reviewed: 10/21/2016 Elsevier Patient Education  2020 [employer].

## 2020-08-28 NOTE — Progress Notes (Signed)
Full PFT performed today. °

## 2020-08-28 NOTE — Assessment & Plan Note (Signed)
-   CT imaging showed coronary artery calcifications. Echocardiogram in September 2021 showed normal LV function, EF 55%. Doppler evidence of grade 1 diastolic dysfunction. Mild mitral annular calcification, trace stenosis. Moderate grade 2 mitral regurgitation - Continue following with Cardiology

## 2020-08-28 NOTE — Assessment & Plan Note (Addendum)
-   Patient reports dyspnea with inclines and moderate exertion. Occasional morning cough. Symptoms appear mild in nature. Pulmonary function testing today showed mild restriction with reversibility. Normal diffusion capacity. FENO was normal. HRCT showed now evidence of ILD, small airway trapping and coronary calcifications. Dyspnea likely multifactorial d/t heart disease and possible underlying asthma. Recommend trial low dose ICS/LABA. Stop Spiriva respimat. Add ocean nasal spray twice daily. Continue to encourage patient quit smoking. FU in 3 months.

## 2020-08-28 NOTE — Progress Notes (Signed)
@Patient  ID: Brenda Franklin, female    DOB: 1946/02/15, 74 y.o.   MRN: [id number]  Chief Complaint  Patient presents with  . Follow-up    DOE, still getting SOB    Referring provider: [name], MD  HPI: 74 year old female, current smoker (30 pack year hx). Past medical hx HTN, CAD, mixed hyperlipidemia. Patient of Dr. Chase Caller, seen for initial consult on 07/25/20 for dyspnea. She saw Dr. Chase Caller on 07/25/20 for initial consult for dyspnea on exertion. HRCT showed no evidence of ILD, small air trapping and coronary artery calcifications. Echocardiogram in September 2021 showed normal LV systolic function with EF 55%, doppler evidence of grade 1 diastolic dysfunction. Mild mitral annular calcification, trace stenosis. Moderate grade 2 mitral regurgitation. Started on Spiriva respimat 1.46mcg daily for suspected COPD. Follows with cardiology.   08/28/2020- Interim hx  Patient presents today for 1 month follow-up with PFTs. Accompanied by her daughter. She gets winded with moderate exertion and inclines. She has an occasional morning cough.  States that she has a deviated septum which causes her to have a hard time breathing through her nose at night. She has been using Spiriva daily but is having a difficulty administering it.  She does follow with ENT, she is considering surgical options down the line. Denies chest tightness, wheezing or cough.   Pulmonary function testing FVC 2.12 (76%), FEV1 1.68 (80%), ratio 79, TLC 134%, DLCOunc 19.05 (99%) +BD response Mild restriction with reversibility. Normal diffusion capacity.   Allergies  Allergen Reactions  . Tolmetin Nausea And Vomiting  . Codeine Nausea And Vomiting and Other (See Comments)    Upset stomach  . Milk-Related Compounds Diarrhea  . Nsaids Diarrhea and Other (See Comments)    Stomach upset  . Other Diarrhea  . Oxycodone Nausea And Vomiting    Immunization History  Administered Date(s) Administered  . Rabies, IM  12/05/2015, 12/05/2015, 12/07/2015, 12/11/2015, 12/18/2015    Past Medical History:  Diagnosis Date  . BCC (basal cell carcinoma) 12/09/2012   right inner eye ([id number])  . Cataracts, bilateral   . Compression fracture of fifth lumbar vertebra ([place])   . Depression   . Extremity pain   . High cholesterol     Tobacco History: Social History   Tobacco Use  Smoking Status Current Every Day Smoker  . Packs/day: 1.00  . Types: Cigarettes  Smokeless Tobacco Never Used   Ready to quit: No Counseling given: Yes   Outpatient Medications Prior to Visit  Medication Sig Dispense Refill  . albuterol (VENTOLIN HFA) 108 (90 Base) MCG/ACT inhaler SMARTSIG:1 Puff(s) By Mouth Every 4 Hours PRN    . atorvastatin (LIPITOR) 40 MG tablet Take 40 mg by mouth at bedtime.    Marland Kitchen atorvastatin (LIPITOR) 80 MG tablet Take 1 tablet (80 mg total) by mouth daily. 60 tablet 2  . atropine 1 % ophthalmic solution SMARTSIG:1 Drop(s) Left Eye    . citalopram (CELEXA) 10 MG tablet Take 10 mg by mouth daily.    Marland Kitchen erythromycin ophthalmic ointment SMARTSIG:In Eye(s)    . LORazepam (ATIVAN) 1 MG tablet Take 1 mg by mouth daily as needed.    Marland Kitchen losartan (COZAAR) 50 MG tablet Take 50 mg by mouth daily.    . metoprolol tartrate (LOPRESSOR) 25 MG tablet Take 1 tablet (25 mg total) by mouth 2 (two) times daily. 120 tablet 3  . moxifloxacin (VIGAMOX) 0.5 % ophthalmic solution Place 1 drop into the left eye every 8 (eight) hours.    Marland Kitchen  Multiple Vitamin (MULTIVITAMIN) capsule Take 1 capsule by mouth daily.    . prednisoLONE acetate (PRED FORTE) 1 % ophthalmic suspension Place 1 drop into the left eye 4 (four) times daily.    . Tiotropium Bromide Monohydrate (SPIRIVA RESPIMAT) 1.25 MCG/ACT AERS Inhale 2 puffs into the lungs daily. 1 each 0   No facility-administered medications prior to visit.   Review of Systems  Review of Systems  Constitutional: Negative.   HENT:       Nasal congestion  Respiratory: Positive for cough.  Negative for chest tightness and wheezing.        DOE  Cardiovascular: Negative.     Physical Exam  BP 122/72 (BP Location: Left Arm, Cuff Size: Normal)   Pulse 62   Temp (!) 97.5 F (36.4 C) (Oral)   Ht 5\' 2"  (1.575 m)   Wt 138 lb (62.6 kg)   SpO2 95%   BMI 25.24 kg/m  Physical Exam Constitutional:      Appearance: Normal appearance.  HENT:     Ears:     Comments: HOH    Mouth/Throat:     Comments: Deferred d/t masking Cardiovascular:     Rate and Rhythm: Normal rate and regular rhythm.  Pulmonary:     Effort: Pulmonary effort is normal.     Breath sounds: Normal breath sounds.  Musculoskeletal:        General: Normal range of motion.  Skin:    General: Skin is warm and dry.  Neurological:     General: No focal deficit present.     Mental Status: She is alert and oriented to person, place, and time. Mental status is at baseline.      Lab Results:  CBC    Component Value Date/Time   WBC 5.3 06/30/2013 1230   RBC 4.80 06/30/2013 1230   HGB 14.0 06/30/2013 1230   HCT 42.6 06/30/2013 1230   PLT 183 06/30/2013 1230   MCV 88.8 06/30/2013 1230   MCH 29.2 06/30/2013 1230   MCHC 32.9 06/30/2013 1230   RDW 12.6 06/30/2013 1230   LYMPHSABS 1.8 06/30/2013 1230   MONOABS 0.2 06/30/2013 1230   EOSABS 0.1 06/30/2013 1230   BASOSABS 0.0 06/30/2013 1230    BMET    Component Value Date/Time   NA 139 06/30/2013 1230   K 4.3 06/30/2013 1230   CL 103 06/30/2013 1230   CO2 25 06/30/2013 1230   GLUCOSE 95 06/30/2013 1230   BUN 13 06/30/2013 1230   CREATININE 0.75 06/30/2013 1230   CALCIUM 8.7 06/30/2013 1230   GFRNONAA 84 (L) 06/30/2013 1230   GFRAA >90 06/30/2013 1230    BNP No results found for: BNP  ProBNP No results found for: PROBNP  Imaging: CT Chest High Resolution  Result Date: 08/10/2020 CLINICAL DATA:  74 year old female with history of shortness of breath. Difficulty breathing. EXAM: CT CHEST WITHOUT CONTRAST TECHNIQUE: Multidetector CT imaging  of the chest was performed following the standard protocol without intravenous contrast. High resolution imaging of the lungs, as well as inspiratory and expiratory imaging, was performed. COMPARISON:  Chest CT 06/30/2013. FINDINGS: Cardiovascular: Heart size is normal. There is no significant pericardial fluid, thickening or pericardial calcification. There is aortic atherosclerosis, as well as atherosclerosis of the great vessels of the mediastinum and the coronary arteries, including calcified atherosclerotic plaque in the left main, left anterior descending, left circumflex and right coronary arteries. Severe calcifications of the mitral annulus. Mediastinum/Nodes: No pathologically enlarged mediastinal or hilar lymph nodes.  Please note that accurate exclusion of hilar adenopathy is limited on noncontrast CT scans. Multiple densely calcified mediastinal and right hilar lymph nodes are incidentally noted. Esophagus is unremarkable in appearance. No axillary lymphadenopathy. Lungs/Pleura: High-resolution images demonstrate no significant regions of ground-glass attenuation, septal thickening, subpleural reticulation, traction bronchiectasis or frank honeycombing to indicate interstitial lung disease. Inspiratory and expiratory imaging demonstrates some mild air trapping indicative of mild small airways disease. No acute consolidative airspace disease. No pleural effusions. Multiple calcified granulomas are noted in the lungs bilaterally. No other suspicious appearing pulmonary nodules or masses are noted. Upper Abdomen: Aortic atherosclerosis. Multiple calcified granulomas in the liver and spleen. 2.5 cm low-attenuation lesion in the upper pole of the left kidney, incompletely characterized on today's non-contrast CT examination, but statistically likely to represent a cyst. 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 Musculoskeletal: Bilateral breast implants are incidentally noted. There are no aggressive appearing lytic or blastic lesions noted in the visualized portions of the skeleton. IMPRESSION: 1. No findings to suggest interstitial lung disease. 2. Mild air trapping indicative of mild small airways disease. 3. Sequela of old granulomatous disease, as above. 4. Aortic atherosclerosis, in addition to left main and 3 vessel coronary artery disease. 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 This was incompletely evaluated on today's noncontrast CT examination. Further evaluation with nonemergent CTA of the abdomen and pelvis is suggested to better evaluate this finding. 5. There are calcifications of the mitral annulus. Echocardiographic correlation for evaluation of potential valvular dysfunction may be warranted if clinically indicated. Aortic Atherosclerosis (ICD10-I70.0). Electronically Signed   By: Vinnie Langton M.D.   On: 08/10/2020 08:32     Assessment & Plan:   Dyspnea - Patient reports dyspnea with inclines and moderate exertion. Occasional morning cough. Symptoms appear mild in nature. Pulmonary function testing today showed mild restriction with reversibility. Normal diffusion capacity. FENO was normal. HRCT showed now evidence of ILD, small airway trapping and coronary calcifications. Dyspnea likely multifactorial d/t heart disease and possible underlying asthma. Recommend trial low dose ICS/LABA. Stop Spiriva respimat. Add ocean nasal spray twice daily. Continue to encourage patient quit smoking. FU in 3 months.   Coronary artery disease involving native coronary artery of native heart without angina pectoris - CT imaging showed coronary artery calcifications. Echocardiogram in September 2021 showed normal LV function, EF 55%. Doppler evidence of grade 1 diastolic dysfunction.  Mild mitral annular calcification, trace stenosis. Moderate grade 2 mitral regurgitation - Continue following with Cardiology    Martyn Ehrich, NP 08/28/2020

## 2020-08-29 MED ORDER — AEROCHAMBER MV MISC: 0 refills | Status: DC

## 2020-08-29 NOTE — Addendum Note (Signed)
Addended by: Martyn Ehrich on: 08/29/2020 10:04 AM   Modules accepted: Orders

## 2020-08-30 ENCOUNTER — Ambulatory Visit: Payer: Medicare Other | Admitting: Cardiology

## 2020-08-30 ENCOUNTER — Encounter: Payer: Self-pay | Admitting: Cardiology

## 2020-08-30 ENCOUNTER — Other Ambulatory Visit: Payer: Self-pay

## 2020-08-30 VITALS — BP 106/72 | HR 68 | Resp 16 | Ht 62.0 in | Wt 139.0 lb

## 2020-08-30 DIAGNOSIS — I2510 Atherosclerotic heart disease of native coronary artery without angina pectoris: Secondary | ICD-10-CM | POA: Diagnosis not present

## 2020-08-30 DIAGNOSIS — E782 Mixed hyperlipidemia: Secondary | ICD-10-CM | POA: Diagnosis not present

## 2020-08-30 DIAGNOSIS — R931 Abnormal findings on diagnostic imaging of heart and coronary circulation: Secondary | ICD-10-CM

## 2020-08-30 DIAGNOSIS — I10 Essential (primary) hypertension: Secondary | ICD-10-CM | POA: Diagnosis not present

## 2020-08-30 NOTE — Progress Notes (Signed)
Patient referred by Brenda Gravel, MD for back pain, possible angina equivalent  Subjective:   Brenda Franklin, female    DOB: 1946/06/10, 74 y.o.   MRN: [id number]   Chief Complaint  Patient presents with  . Coronary artery disease involving native coronary artery of   . Follow-up    HPI  74 y.o. Caucasian female with hypertension, hyperlipidemia, tobacco dependence, coronary calcification.  Patient has not had any chest pain. She has shortness of breath primarily when she sleeps on her back. She tells me that she is going to undergo a surgery for her nose, which is causing her shortness of breath.  She was recently seen by pulmonology and thought to have some underlying asthma, in addition to coronary calcification, as the likely causes of her shortness of breath.    Initial consultation HPI 04/2020: Patient is here with her daughter today.  Patient lives by herself, performs all her ADLs by itself.  She also takes care of her puppy.  She is able to perform these activities without much difficulty through the morning.  However, around noon onwards, she reports having deep pain in her back radiating across to the front of her chest.  Pain last for several hours, until she finally lays down at night.  Pain is not typically worse with physical exertion.  Patient reports having had issues with neck and back pain over the years.  She has not undergone any surgery.  She does endorse exertional dyspnea, which has been stable and unchanged.  She has never undergone pulmonary function testing. Unfortunately, she continues to smoke 1 pack/day.  She reports family history of coronary artery disease in her father.  She is unaware if there is any family history of familial hypercholesterolemia.  Reviewed outside records including EKG and labs.   Current Outpatient Medications on File Prior to Visit  Medication Sig Dispense Refill  . albuterol (VENTOLIN HFA) 108 (90 Base) MCG/ACT inhaler SMARTSIG:1  Puff(s) By Mouth Every 4 Hours PRN    . atorvastatin (LIPITOR) 40 MG tablet Take 40 mg by mouth at bedtime.    Marland Kitchen atorvastatin (LIPITOR) 80 MG tablet Take 1 tablet (80 mg total) by mouth daily. 60 tablet 2  . atropine 1 % ophthalmic solution SMARTSIG:1 Drop(s) Left Eye    . budesonide-formoterol (SYMBICORT) 80-4.5 MCG/ACT inhaler Inhale 2 puffs into the lungs 2 (two) times daily. 1 each 5  . citalopram (CELEXA) 10 MG tablet Take 10 mg by mouth daily.    Marland Kitchen erythromycin ophthalmic ointment SMARTSIG:In Eye(s)    . LORazepam (ATIVAN) 1 MG tablet Take 1 mg by mouth daily as needed.    Marland Kitchen losartan (COZAAR) 50 MG tablet Take 50 mg by mouth daily.    . metoprolol tartrate (LOPRESSOR) 25 MG tablet Take 1 tablet (25 mg total) by mouth 2 (two) times daily. 120 tablet 3  . moxifloxacin (VIGAMOX) 0.5 % ophthalmic solution Place 1 drop into the left eye every 8 (eight) hours.    . Multiple Vitamin (MULTIVITAMIN) capsule Take 1 capsule by mouth daily.    . prednisoLONE acetate (PRED FORTE) 1 % ophthalmic suspension Place 1 drop into the left eye 4 (four) times daily.    . sodium chloride (OCEAN) 0.65 % SOLN nasal spray Place 1 spray into both nostrils as needed for congestion. 60 mL 2  . Spacer/Aero-Holding Chambers (AEROCHAMBER MV) inhaler Use as instructed 1 each 0   No current facility-administered medications on file prior to visit.  Cardiovascular and other pertinent studies:  Lexiscan/modified Bruce Tetrofosmin stress test 06/21/2020: Lexiscan/modified Bruce nuclear stress test performed using 1-day protocol. Stress EKG is non-diagnostic, as this is pharmacological stress test. In addition, stress EKG at 82% MPHR showed no ischemic changes.  Small sized, mild intensity, reversible perfusion defect in basal inferoseptal myocardium.  Low risk study.  Stress LVEF 69%.  Echocardiogram 06/20/2020:  Left ventricle cavity is normal in size and wall thickness. Normal global  wall motion. Normal LV systolic  function with EF 55%. Doppler evidence of  grade I (impaired) diastolic dysfunction, normal LAP. Calculated EF 55%.  Trileaflet aortic valve. Trace aortic regurgitation.  Mild mitral annular calcification. Trace mitral stenosis. Moderate (Grade  II) mitral regurgitation.  Inadequate TR jet to estimate pulmonary artery systolic pressure. Normal  right atrial pressure.   ABI 06/19/2020:  This exam reveals mildly decreased perfusion of the right lower extremity,  noted at the anterior tibial and post tibial artery level (ABI 0.88) and  mildly decreased perfusion of the left lower extremity, noted at the post  tibial artery level (ABI 0.88). Mildly abnormal biphasic waveforms at the  bilateral AT.   CT Cardiac scoring 06/01/2020: Total score: 867 LM: 94 LAD: 501 LCx: 0 RCA: 272  EKG 05/25/2020: Sinus rhythm 62 bpm Normal EKG  Recent labs: 06/22/2020: Chol 196, TG 111, HDL 62, LDL 114  05/15/2020: EGFR 73 Chol N/A, TG 160, HDL 68, LDL 207 TSH 2.9 normal   Review of Systems  Cardiovascular: Positive for chest pain and dyspnea on exertion. Negative for leg swelling, palpitations and syncope.         Vitals:   08/30/20 1437  BP: 106/72  Pulse: 68  Resp: 16  SpO2: 95%     Body mass index is 25.24 kg/m. Filed Weights   08/30/20 1437  Weight: 139 lb (63 kg)     Objective:   Physical Exam Vitals and nursing note reviewed.  Constitutional:      General: She is not in acute distress. Neck:     Vascular: No JVD.  Cardiovascular:     Rate and Rhythm: Normal rate and regular rhythm.     Pulses:          Dorsalis pedis pulses are 0 on the right side and 0 on the left side.       Posterior tibial pulses are 1+ on the right side and 1+ on the left side.     Heart sounds: Normal heart sounds. No murmur heard.   Pulmonary:     Effort: Pulmonary effort is normal.     Breath sounds: Normal breath sounds. No wheezing or rales.         Assessment &  Recommendations:   74 y.o. Caucasian female with hypertension, hyperlipidemia, tobacco dependence, coronary calcification.  CAD: Elevated calcium score and mildly abnormal stress test.  No overt angina/angina equivalent symptoms. Continue Aspirin 81 mg, metoprolol tartarate 25 mg bid, lipitor 80 mg If worsening symptoms of shortness of breath, will consider coronary angiography.  Mixed hyperlipidemia: LDL down from 207 to 114 on lipitor 40 mg. Currently on lipitor 80 mg. Check lipid panel. If LDL >70, will consider adding Zetia/PCSK9 inhibitor.   Abnormal peripheral pulse: Mildly abnormal ABI. Recommend risk factor modification, tobacco cessation, regular walking.  F/u in 3 months   Nigel Mormon, MD Pager: [phone] Office: [phone]

## 2020-09-05 DIAGNOSIS — I2510 Atherosclerotic heart disease of native coronary artery without angina pectoris: Secondary | ICD-10-CM | POA: Diagnosis not present

## 2020-09-05 DIAGNOSIS — E782 Mixed hyperlipidemia: Secondary | ICD-10-CM | POA: Diagnosis not present

## 2020-09-06 ENCOUNTER — Telehealth: Payer: Self-pay

## 2020-09-06 ENCOUNTER — Other Ambulatory Visit: Payer: Self-pay

## 2020-09-06 DIAGNOSIS — E782 Mixed hyperlipidemia: Secondary | ICD-10-CM

## 2020-09-06 DIAGNOSIS — I77811 Abdominal aortic ectasia: Secondary | ICD-10-CM

## 2020-09-06 LAB — LIPID PANEL
Chol/HDL Ratio: 3.2 ratio (ref 0.0–4.4)
Cholesterol, Total: 214 mg/dL — ABNORMAL HIGH (ref 100–199)
HDL: 67 mg/dL (ref >39)
LDL Chol Calc (NIH): 128 mg/dL — ABNORMAL HIGH (ref 0–99)
Triglycerides: 106 mg/dL (ref 0–149)
VLDL Cholesterol Cal: 19 mg/dL (ref 5–40)

## 2020-09-06 MED ORDER — EZETIMIBE 10 MG PO TABS: 10.0000 mg | ORAL_TABLET | Freq: Every day | ORAL | 3 refills | Status: DC

## 2020-09-06 MED ORDER — REPATHA SURECLICK 140 MG/ML ~~LOC~~ SOAJ: 140.0000 mg | SUBCUTANEOUS | 3 refills | Status: DC

## 2020-09-06 NOTE — Progress Notes (Signed)
Called pt to inform her about her lab send Rx to pharmacy pt understood

## 2020-09-06 NOTE — Telephone Encounter (Signed)
Reviewed lipid panel results with patient's daughter.  Recommend adding Zetia 10 mg daily.  She is currently also taking Lipitor 40 mg daily.  In addition, recommend CT angiogram to evaluate infrarenal abdominal aorta given incidental findings stated below.  Repeat lipid panel in February.  Follow-up with me in March 2022.  CTA chest 08/09/2020: 1. No findings to suggest interstitial lung disease. 2. Mild air trapping indicative of mild small airways disease. 3. Sequela of old granulomatous disease, as above. 4. Aortic atherosclerosis, in addition to left main and 3 vessel coronary artery disease. 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 This was incompletely evaluated on today's noncontrast CT examination. Further evaluation with nonemergent CTA of the abdomen and pelvis is suggested to better evaluate this finding. 5. There are calcifications of the mitral annulus. Echocardiographic correlation for evaluation of potential valvular dysfunction may be warranted if clinically indicated.  Aortic Atherosclerosis (ICD10-I70.0).    Nigel Mormon, MD Pager: [phone] Office: [phone]

## 2020-09-06 NOTE — Progress Notes (Signed)
LDL improved but remains above recommended level of 70. Consider two options 1. Start oral Zetia 10 mg daily 2. Start SQ Repatha every two weeks  Please send prescription, based on what patient opts for.  Thanks MJP

## 2020-09-06 NOTE — Telephone Encounter (Signed)
[name] pt daughter called asking if you could give her a call to inform her about pt last visit on 12/01.

## 2020-09-15 DIAGNOSIS — M25571 Pain in right ankle and joints of right foot: Secondary | ICD-10-CM | POA: Diagnosis not present

## 2020-09-20 DIAGNOSIS — H6982 Other specified disorders of Eustachian tube, left ear: Secondary | ICD-10-CM | POA: Diagnosis not present

## 2020-09-20 DIAGNOSIS — R072 Precordial pain: Secondary | ICD-10-CM | POA: Diagnosis not present

## 2020-09-20 DIAGNOSIS — N6322 Unspecified lump in the left breast, upper inner quadrant: Secondary | ICD-10-CM | POA: Diagnosis not present

## 2020-09-25 ENCOUNTER — Other Ambulatory Visit: Payer: Self-pay | Admitting: Internal Medicine

## 2020-09-25 DIAGNOSIS — N6321 Unspecified lump in the left breast, upper outer quadrant: Secondary | ICD-10-CM

## 2020-09-28 ENCOUNTER — Other Ambulatory Visit: Payer: Self-pay | Admitting: Cardiology

## 2020-10-01 ENCOUNTER — Other Ambulatory Visit: Payer: Self-pay | Admitting: Internal Medicine

## 2020-10-02 DIAGNOSIS — M25571 Pain in right ankle and joints of right foot: Secondary | ICD-10-CM | POA: Diagnosis not present

## 2020-10-03 ENCOUNTER — Ambulatory Visit
Admission: RE | Admit: 2020-10-03 | Discharge: 2020-10-03 | Disposition: A | Discharge status: Home / Self Care | Payer: Medicare Other | Source: Ambulatory Visit | Attending: Cardiology | Admitting: Cardiology

## 2020-10-03 ENCOUNTER — Other Ambulatory Visit: Payer: Self-pay

## 2020-10-03 DIAGNOSIS — M4856XA Collapsed vertebra, not elsewhere classified, lumbar region, initial encounter for fracture: Secondary | ICD-10-CM | POA: Diagnosis not present

## 2020-10-03 DIAGNOSIS — I77811 Abdominal aortic ectasia: Secondary | ICD-10-CM

## 2020-10-03 DIAGNOSIS — M4854XA Collapsed vertebra, not elsewhere classified, thoracic region, initial encounter for fracture: Secondary | ICD-10-CM | POA: Diagnosis not present

## 2020-10-03 DIAGNOSIS — I712 Thoracic aortic aneurysm, without rupture: Secondary | ICD-10-CM | POA: Diagnosis not present

## 2020-10-03 DIAGNOSIS — I714 Abdominal aortic aneurysm, without rupture: Secondary | ICD-10-CM | POA: Diagnosis not present

## 2020-10-03 MED ORDER — IOPAMIDOL (ISOVUE-370) INJECTION 76%
75.0000 mL | Freq: Once | INTRAVENOUS | Status: AC | PRN
  Administered 2020-10-03: 75 mL via INTRAVENOUS

## 2020-10-04 ENCOUNTER — Other Ambulatory Visit: Payer: Self-pay | Admitting: Cardiology

## 2020-10-04 DIAGNOSIS — I719 Aortic aneurysm of unspecified site, without rupture: Secondary | ICD-10-CM

## 2020-10-04 NOTE — Progress Notes (Signed)
Results discussed with patient's daughter, as patient was not reachable on er phone today. Continue statin, currently takes lipitor 40 mg, along with Zetia. Recommend vascular surgery referral.

## 2020-10-04 NOTE — Progress Notes (Signed)
Referred to vascular surgery

## 2020-10-17 ENCOUNTER — Encounter: Payer: Medicare Other | Admitting: Vascular Surgery

## 2020-10-23 DIAGNOSIS — M25571 Pain in right ankle and joints of right foot: Secondary | ICD-10-CM | POA: Diagnosis not present

## 2020-10-31 ENCOUNTER — Other Ambulatory Visit: Payer: Self-pay

## 2020-10-31 ENCOUNTER — Encounter: Payer: Self-pay | Admitting: Vascular Surgery

## 2020-10-31 ENCOUNTER — Ambulatory Visit (INDEPENDENT_AMBULATORY_CARE_PROVIDER_SITE_OTHER): Payer: Medicare Other | Admitting: Vascular Surgery

## 2020-10-31 DIAGNOSIS — I719 Aortic aneurysm of unspecified site, without rupture: Secondary | ICD-10-CM | POA: Insufficient documentation

## 2020-10-31 NOTE — Progress Notes (Signed)
Patient name: Brenda Franklin MRN: [id number] DOB: 02-02-1946 Sex: female  REASON FOR CONSULT: Penetrating aortic ulcer  HPI: Brenda Franklin is a 75 y.o. 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  Sounds like she was being worked up for shortness of breath by her cardiologist Dr. Virgina Jock and had a CT chest on 08/10/2020 where there was concern for a dissection flap in the infrarenal abdominal aorta.  Further work-up with a CTA on 10/03/2020 showed a PAU arising from the left lateral wall of the abdominal aorta with a maximal diameter of 2.4 cm.  She denies any abdominal pain at this time.  She was having some back pain months ago but sounds like that was intermittent and is now improved.  She is a heavy tobacco abuser smokes about a pack a day after the recent loss of her son.  Abdominal surgery includes cholecystectomy.  Past Medical History:  Diagnosis Date  . BCC (basal cell carcinoma) 12/09/2012   right inner eye ([id number])  . Cataracts, bilateral   . Compression fracture of fifth lumbar vertebra ([place])   . Depression   . Extremity pain   . High cholesterol     Past Surgical History:  Procedure Laterality Date  . ABDOMINAL HYSTERECTOMY    . BREAST ENHANCEMENT SURGERY    . COLONOSCOPY    . ESOPHAGEAL DILATION    . EXCISION VAGINAL CYST    . HEMORRHOID SURGERY    . metal stimulator for bowel control     Removed  . NOSE SURGERY      Family History  Problem Relation Age of Onset  . Other Mother        Intestinal problem - gangrene  . Cancer Father        Bile Duct Cancer  . Heart disease Father   . Heart attack Father   . Hypercholesterolemia Father   . Kidney cancer Sister   . Kidney cancer Brother     SOCIAL HISTORY: Social History   Socioeconomic History  . Marital status: Divorced    Spouse name: Not on file  . Number of children: 4  . Years of education: 1 year of college  . Highest  education level: Not on file  Occupational History  . Occupation: Retired  Tobacco Use  . Smoking status: Current Every Day Smoker    Packs/day: 1.00    Types: Cigarettes  . Smokeless tobacco: Never Used  Vaping Use  . Vaping Use: Never used  Substance and Sexual Activity  . Alcohol use: No  . Drug use: No  . Sexual activity: Not on file  Other Topics Concern  . Not on file  Social History Narrative   Lives at home with her daughter.   Right-handed.   2-3 cups caffeine daily.   Social Determinants of Health   Financial Resource Strain: Not on file  Food Insecurity: Not on file  Transportation Needs: Not on file  Physical Activity: Not on file  Stress: Not on file  Social Connections: Not on file  Intimate Partner Violence: Not on file    Allergies  Allergen Reactions  . Tolmetin Nausea And Vomiting  . Codeine Nausea And Vomiting and Other (See Comments)    Upset stomach  . Milk-Related Compounds Diarrhea  . Nsaids Diarrhea and Other (See Comments)    Stomach upset  . Other Diarrhea  . Oxycodone Nausea And Vomiting    Current Outpatient Medications  Medication Sig Dispense Refill  . albuterol (VENTOLIN HFA) 108 (90 Base) MCG/ACT inhaler SMARTSIG:1 Puff(s) By Mouth Every 4 Hours PRN    . atorvastatin (LIPITOR) 40 MG tablet TAKE 1 TABLET(40 MG) BY MOUTH DAILY 90 tablet 0  . atropine 1 % ophthalmic solution SMARTSIG:1 Drop(s) Left Eye    . budesonide-formoterol (SYMBICORT) 80-4.5 MCG/ACT inhaler Inhale 2 puffs into the lungs 2 (two) times daily. 1 each 5  . citalopram (CELEXA) 10 MG tablet Take 10 mg by mouth daily.    Marland Kitchen erythromycin ophthalmic ointment SMARTSIG:In Eye(s)    . ezetimibe (ZETIA) 10 MG tablet Take 1 tablet (10 mg total) by mouth daily. 90 tablet 3  . LORazepam (ATIVAN) 1 MG tablet Take 1 mg by mouth daily as needed.    Marland Kitchen losartan (COZAAR) 50 MG tablet Take 50 mg by mouth daily.    . Multiple Vitamin (MULTIVITAMIN) capsule Take 1 capsule by mouth  daily.    . prednisoLONE acetate (PRED FORTE) 1 % ophthalmic suspension Place 1 drop into the left eye 4 (four) times daily.    . sodium chloride (OCEAN) 0.65 % SOLN nasal spray Place 1 spray into both nostrils as needed for congestion. 60 mL 2  . Spacer/Aero-Holding Chambers (AEROCHAMBER MV) inhaler Use as instructed 1 each 0  . SPIRIVA RESPIMAT 1.25 MCG/ACT AERS INHALE 2 PUFFS INTO THE LUNGS DAILY 4 g 5  . metoprolol tartrate (LOPRESSOR) 25 MG tablet Take 1 tablet (25 mg total) by mouth 2 (two) times daily. 120 tablet 3  . moxifloxacin (VIGAMOX) 0.5 % ophthalmic solution Place 1 drop into the left eye every 8 (eight) hours. (Patient not taking: Reported on 10/31/2020)     No current facility-administered medications for this visit.    REVIEW OF SYSTEMS:  [X]  denotes positive finding, [ ]  denotes negative finding Cardiac  Comments:  Chest pain or chest pressure:    Shortness of breath upon exertion:    Short of breath when lying flat:    Irregular heart rhythm:        Vascular    Pain in calf, thigh, or hip brought on by ambulation:    Pain in feet at night that wakes you up from your sleep:     Blood clot in your veins:    Leg swelling:         Pulmonary    Oxygen at home:    Productive cough:     Wheezing:         Neurologic    Sudden weakness in arms or legs:     Sudden numbness in arms or legs:     Sudden onset of difficulty speaking or slurred speech:    Temporary loss of vision in one eye:     Problems with dizziness:         Gastrointestinal    Blood in stool:     Vomited blood:         Genitourinary    Burning when urinating:     Blood in urine:        Psychiatric    Major depression:         Hematologic    Bleeding problems:    Problems with blood clotting too easily:        Skin    Rashes or ulcers:        Constitutional    Fever or chills:      PHYSICAL EXAM: Vitals:   10/31/20 1609  BP:  109/64  Pulse: 62  Resp: 16  Temp: 98.2 F (36.8 C)   TempSrc: Temporal  SpO2: 96%  Weight: 137 lb (62.1 kg)  Height: 5\' 4"  (1.626 m)    GENERAL: The patient is a well-nourished female, in no acute distress. The vital signs are documented above. CARDIAC: There is a regular rate and rhythm.  VASCULAR:  Palpable femoral pulses bilaterally PULMONARY: No respiratory distress. ABDOMEN: Soft and non-tender.  No pain with deep palpation. MUSCULOSKELETAL: There are no major deformities or cyanosis. NEUROLOGIC: No focal weakness or paresthesias are detected. SKIN: There are no ulcers or rashes noted. PSYCHIATRIC: The patient has a normal affect.  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  I discussed etiology of penetrating aortic ulcers with the patient and her daughter.  Discussed importance of smoking cessation as well as statin therapy.  We discussed we will continue observation and get another CT scan in 6 months.  I do not think she is having any symptoms from this at this time.  I do not have another scan to compare this to and discussed she has ongoing growth of the aneurysmal segment we would likely evaluate for stent graft repair in the the future.   Marty Heck, MD [facility] Office: [phone]

## 2020-11-08 ENCOUNTER — Other Ambulatory Visit: Payer: Self-pay

## 2020-11-08 ENCOUNTER — Other Ambulatory Visit: Payer: Self-pay | Admitting: Internal Medicine

## 2020-11-08 ENCOUNTER — Ambulatory Visit
Admission: RE | Admit: 2020-11-08 | Discharge: 2020-11-08 | Disposition: A | Discharge status: Home / Self Care | Payer: Medicare Other | Source: Ambulatory Visit | Attending: Internal Medicine | Admitting: Internal Medicine

## 2020-11-08 ENCOUNTER — Ambulatory Visit: Payer: Medicare Other

## 2020-11-08 DIAGNOSIS — N6321 Unspecified lump in the left breast, upper outer quadrant: Secondary | ICD-10-CM

## 2020-11-08 DIAGNOSIS — R928 Other abnormal and inconclusive findings on diagnostic imaging of breast: Secondary | ICD-10-CM | POA: Diagnosis not present

## 2020-11-09 ENCOUNTER — Telehealth: Payer: Self-pay | Admitting: Internal Medicine

## 2020-11-09 NOTE — Telephone Encounter (Signed)
Call made to patient daughter Brenda Franklin, confirmed patient DOB. Requests to cancel appt 2/17. Daughter states patient has been doing well on the symbicort. I made her aware to be sure to call us if anything changes.   Recall placed for 6 months for f/u appt.  Nothing further needed at this time.

## 2020-11-16 ENCOUNTER — Ambulatory Visit: Payer: Medicare Other | Admitting: Internal Medicine

## 2020-11-29 ENCOUNTER — Ambulatory Visit: Payer: Medicare Other | Admitting: Cardiology

## 2020-11-30 ENCOUNTER — Ambulatory Visit: Payer: Medicare Other | Admitting: Cardiology

## 2020-12-04 ENCOUNTER — Ambulatory Visit: Payer: Medicare Other | Admitting: Cardiology

## 2020-12-23 ENCOUNTER — Other Ambulatory Visit: Payer: Self-pay | Admitting: Cardiology

## 2020-12-26 DIAGNOSIS — E782 Mixed hyperlipidemia: Secondary | ICD-10-CM | POA: Diagnosis not present

## 2020-12-27 ENCOUNTER — Other Ambulatory Visit: Payer: Self-pay | Admitting: Cardiology

## 2020-12-27 DIAGNOSIS — I2510 Atherosclerotic heart disease of native coronary artery without angina pectoris: Secondary | ICD-10-CM

## 2020-12-27 LAB — LIPID PANEL
Chol/HDL Ratio: 2.9 ratio (ref 0.0–4.4)
Cholesterol, Total: 194 mg/dL (ref 100–199)
HDL: 68 mg/dL (ref >39)
LDL Chol Calc (NIH): 99 mg/dL (ref 0–99)
Triglycerides: 158 mg/dL — ABNORMAL HIGH (ref 0–149)
VLDL Cholesterol Cal: 27 mg/dL (ref 5–40)

## 2020-12-28 DIAGNOSIS — E559 Vitamin D deficiency, unspecified: Secondary | ICD-10-CM | POA: Diagnosis not present

## 2020-12-28 DIAGNOSIS — E7800 Pure hypercholesterolemia, unspecified: Secondary | ICD-10-CM | POA: Diagnosis not present

## 2020-12-28 DIAGNOSIS — R7309 Other abnormal glucose: Secondary | ICD-10-CM | POA: Diagnosis not present

## 2020-12-28 DIAGNOSIS — I10 Essential (primary) hypertension: Secondary | ICD-10-CM | POA: Diagnosis not present

## 2020-12-28 DIAGNOSIS — R946 Abnormal results of thyroid function studies: Secondary | ICD-10-CM | POA: Diagnosis not present

## 2021-01-01 ENCOUNTER — Encounter: Payer: Self-pay | Admitting: Cardiology

## 2021-01-01 ENCOUNTER — Other Ambulatory Visit: Payer: Self-pay

## 2021-01-01 ENCOUNTER — Ambulatory Visit: Payer: Medicare Other | Admitting: Cardiology

## 2021-01-01 ENCOUNTER — Inpatient Hospital Stay: Payer: Medicare Other

## 2021-01-01 VITALS — BP 133/58 | HR 64 | Temp 98.0°F | Ht 64.0 in | Wt 141.0 lb

## 2021-01-01 DIAGNOSIS — F17210 Nicotine dependence, cigarettes, uncomplicated: Secondary | ICD-10-CM | POA: Diagnosis not present

## 2021-01-01 DIAGNOSIS — F17200 Nicotine dependence, unspecified, uncomplicated: Secondary | ICD-10-CM

## 2021-01-01 DIAGNOSIS — R002 Palpitations: Secondary | ICD-10-CM

## 2021-01-01 DIAGNOSIS — E782 Mixed hyperlipidemia: Secondary | ICD-10-CM

## 2021-01-01 DIAGNOSIS — I719 Aortic aneurysm of unspecified site, without rupture: Secondary | ICD-10-CM

## 2021-01-01 DIAGNOSIS — I10 Essential (primary) hypertension: Secondary | ICD-10-CM | POA: Diagnosis not present

## 2021-01-01 DIAGNOSIS — I2510 Atherosclerotic heart disease of native coronary artery without angina pectoris: Secondary | ICD-10-CM | POA: Diagnosis not present

## 2021-01-01 MED ORDER — NICOTINE 14 MG/24HR TD PT24: 14.0000 mg | MEDICATED_PATCH | Freq: Every day | TRANSDERMAL | 5 refills | Status: DC

## 2021-01-01 NOTE — Progress Notes (Signed)
Patient referred by Brenda Gravel, MD for back pain, possible angina equivalent  Subjective:   Brenda Franklin, female    DOB: 1946/05/06, 75 y.o.   MRN: [id number]   Chief Complaint  Patient presents with  . Coronary Artery Disease  . Follow-up    HPI  75 y.o. Caucasian female with hypertension, hyperlipidemia, tobacco dependence, coronary artery disease, penetrating ulcer of infrarenal abdominal aorta  Patient's daughter participated in the conversation by joining over video chat.  Patient was seen by vascular surgeon Dr. Carlis Abbott who felt patient was asymptomatic and can be followed with serial CT monitoring.  In future, she may need abdominal aorta stent graft, especially there is any aneurysmal changes.  Patient has continued to have upper back pain, especially around noon time.  Otherwise, she denies any fever or exertional anginal symptoms.  Blood pressure is well controlled.  Reviewed recent lipid panel with the patient, details below.   Current Outpatient Medications on File Prior to Visit  Medication Sig Dispense Refill  . albuterol (VENTOLIN HFA) 108 (90 Base) MCG/ACT inhaler SMARTSIG:1 Puff(s) By Mouth Every 4 Hours PRN    . atorvastatin (LIPITOR) 40 MG tablet TAKE 1 TABLET(40 MG) BY MOUTH DAILY 90 tablet 0  . atropine 1 % ophthalmic solution SMARTSIG:1 Drop(s) Left Eye    . budesonide-formoterol (SYMBICORT) 80-4.5 MCG/ACT inhaler Inhale 2 puffs into the lungs 2 (two) times daily. 1 each 5  . citalopram (CELEXA) 10 MG tablet Take 10 mg by mouth daily.    Marland Kitchen erythromycin ophthalmic ointment SMARTSIG:In Eye(s)    . ezetimibe (ZETIA) 10 MG tablet Take 1 tablet (10 mg total) by mouth daily. 90 tablet 3  . LORazepam (ATIVAN) 1 MG tablet Take 1 mg by mouth daily as needed.    Marland Kitchen losartan (COZAAR) 50 MG tablet Take 50 mg by mouth daily.    . metoprolol tartrate (LOPRESSOR) 25 MG tablet TAKE 1 TABLET(25 MG) BY MOUTH TWICE DAILY 120 tablet 3  . moxifloxacin (VIGAMOX) 0.5 % ophthalmic  solution Place 1 drop into the left eye every 8 (eight) hours. (Patient not taking: Reported on 10/31/2020)    . Multiple Vitamin (MULTIVITAMIN) capsule Take 1 capsule by mouth daily.    . prednisoLONE acetate (PRED FORTE) 1 % ophthalmic suspension Place 1 drop into the left eye 4 (four) times daily.    . sodium chloride (OCEAN) 0.65 % SOLN nasal spray Place 1 spray into both nostrils as needed for congestion. 60 mL 2  . Spacer/Aero-Holding Chambers (AEROCHAMBER MV) inhaler Use as instructed 1 each 0  . SPIRIVA RESPIMAT 1.25 MCG/ACT AERS INHALE 2 PUFFS INTO THE LUNGS DAILY 4 g 5   No current facility-administered medications on file prior to visit.    Cardiovascular and other pertinent studies:  EKG 01/01/2021: Sinus rhythm 63 bpm Normal EKG  CTA abdomen 09/2020: 1. Penetrating atherosclerotic ulceration arising from the left lateral wall of the infrarenal abdominal aorta results in focal aneurysmal dilation of the aorta with a maximal diameter of 2.4 cm (1.5 times the diameter of the adjacent normal aorta which is measured at 1.6 cm). Due to the suspected etiology of this abnormality, traditional follow-up recommendations for abdominal aortic aneurysm may not be adequate. Consider initiation of statin therapy to stabilize the underlying plaque as well as initial follow-up CT arteriogram in 6 months. Aortic Atherosclerosis (ICD10-I70.0); Aortic aneurysm NOS (ICD10-I71.9). 2. Coronary artery calcifications.  NON-VASCULAR  1. Simple appearing 3.4 cm left ovarian cystic lesion, likely benign. Recommend follow-up  US in 6-12 months. Note: This recommendation does not apply to premenarchal patients and to those with increased risk (genetic, family history, elevated tumor markers or other high-risk factors) of ovarian cancer. Reference: JACR 2020 Feb; 17(2):248-254 2. 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 3. 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 4. Chronic T12 and L4 compression fractures.  Lexiscan/modified Bruce Tetrofosmin stress test 06/21/2020: Lexiscan/modified Bruce nuclear stress test performed using 1-day protocol. Stress EKG is non-diagnostic, as this is pharmacological stress test. In addition, stress EKG at 82% MPHR showed no ischemic changes.  Small sized, mild intensity, reversible perfusion defect in basal inferoseptal myocardium.  Low risk study.  Stress LVEF 69%.  Echocardiogram 06/20/2020:  Left ventricle cavity is normal in size and wall thickness. Normal global  wall motion. Normal LV systolic function with EF 55%. Doppler evidence of  grade I (impaired) diastolic dysfunction, normal LAP. Calculated EF 55%.  Trileaflet aortic valve. Trace aortic regurgitation.  Mild mitral annular calcification. Trace mitral stenosis. Moderate (Grade  II) mitral regurgitation.  Inadequate TR jet to estimate pulmonary artery systolic pressure. Normal  right atrial pressure.   ABI 06/19/2020:  This exam reveals mildly decreased perfusion of the right lower extremity,  noted at the anterior tibial and post tibial artery level (ABI 0.88) and  mildly decreased perfusion of the left lower extremity, noted at the post  tibial artery level (ABI 0.88). Mildly abnormal biphasic waveforms at the  bilateral AT.   CT Cardiac scoring 06/01/2020: Total score: 867 LM: 94 LAD: 501 LCx: 0 RCA: 272  EKG 05/25/2020: Sinus rhythm 62 bpm Normal EKG  Recent labs: 06/22/2020: Chol 196, TG 111, HDL 62, LDL 114  05/15/2020: EGFR 73 Chol N/A, TG 160, HDL 68, LDL 207 TSH 2.9 normal   Review of Systems  Cardiovascular: Positive for chest pain and dyspnea on exertion. Negative for leg swelling, palpitations and syncope.         Vitals:   01/01/21 1320  BP: (!) 133/58  Pulse: 64  Temp: 98 F (36.7 C)  SpO2: 97%     Body mass index is 24.2 kg/m. Filed Weights   01/01/21 1320  Weight: 141 lb (64 kg)      Objective:   Physical Exam Vitals and nursing note reviewed.  Constitutional:      General: She is not in acute distress. Neck:     Vascular: No JVD.  Cardiovascular:     Rate and Rhythm: Normal rate and regular rhythm.     Pulses:          Dorsalis pedis pulses are 0 on the right side and 0 on the left side.       Posterior tibial pulses are 1+ on the right side and 1+ on the left side.     Heart sounds: Normal heart sounds. No murmur heard.   Pulmonary:     Effort: Pulmonary effort is normal.     Breath sounds: Normal breath sounds. No wheezing or rales.         Assessment & Recommendations:   75 y.o. Caucasian female with hypertension, hyperlipidemia, tobacco dependence, coronary artery disease, penetrating ulcer of infrarenal abdominal aorta  Penetrating ulcer of aorta: Continue statin.  Resumed aspirin 81 mg. Ordered repeat CTA for July 2022.  Continue follow-up with vascular surgery.  CAD: Elevated calcium score and mildly abnormal stress test.  No overt angina/angina equivalent symptoms.  I suspect her back pain symptoms  are most likely musculoskeletal in etiology. Continue Aspirin 81 mg, metoprolol tartarate 25 mg bid, lipitor 40 mg, Zetia 10 mg daily. She did not tolerate Lipitor 80 mg due to symptoms of myalgia. If worsening symptoms of shortness of breath, will consider coronary angiography.  Mixed hyperlipidemia: LDL down from 99.  Continue Lipitor 40, Zetia 10 mg daily.  Abnormal peripheral pulse: Mildly abnormal ABI. Recommend risk factor modification, tobacco cessation, regular walking.  Nicotine dependence: Tobacco cessation counseling:  - Currently smoking 1 packs/day   - Patient was informed of the dangers of tobacco abuse including stroke, cancer, and MI, as well as benefits of tobacco cessation. - Patient is willing to quit at this time. - Approximately 5 mins were spent counseling patient cessation techniques. We discussed various methods  to help quit smoking, including deciding on a date to quit, joining a support group, pharmacological agents. Patient would like to use nicotine patch. - I will reassess her progress at the next follow-up visit   F/u in 3 months   Nigel Mormon, MD Pager: [phone] Office: [phone]

## 2021-01-02 DIAGNOSIS — H838X3 Other specified diseases of inner ear, bilateral: Secondary | ICD-10-CM | POA: Diagnosis not present

## 2021-01-02 DIAGNOSIS — J343 Hypertrophy of nasal turbinates: Secondary | ICD-10-CM | POA: Diagnosis not present

## 2021-01-02 DIAGNOSIS — H903 Sensorineural hearing loss, bilateral: Secondary | ICD-10-CM | POA: Diagnosis not present

## 2021-01-02 DIAGNOSIS — H9313 Tinnitus, bilateral: Secondary | ICD-10-CM | POA: Diagnosis not present

## 2021-01-02 DIAGNOSIS — J310 Chronic rhinitis: Secondary | ICD-10-CM | POA: Diagnosis not present

## 2021-01-04 DIAGNOSIS — F5101 Primary insomnia: Secondary | ICD-10-CM | POA: Diagnosis not present

## 2021-01-04 DIAGNOSIS — F329 Major depressive disorder, single episode, unspecified: Secondary | ICD-10-CM | POA: Diagnosis not present

## 2021-01-04 DIAGNOSIS — Z0000 Encounter for general adult medical examination without abnormal findings: Secondary | ICD-10-CM | POA: Diagnosis not present

## 2021-01-04 DIAGNOSIS — I10 Essential (primary) hypertension: Secondary | ICD-10-CM | POA: Diagnosis not present

## 2021-01-04 DIAGNOSIS — R7303 Prediabetes: Secondary | ICD-10-CM | POA: Diagnosis not present

## 2021-01-04 DIAGNOSIS — E7800 Pure hypercholesterolemia, unspecified: Secondary | ICD-10-CM | POA: Diagnosis not present

## 2021-01-15 ENCOUNTER — Other Ambulatory Visit: Payer: Self-pay

## 2021-01-15 DIAGNOSIS — F17200 Nicotine dependence, unspecified, uncomplicated: Secondary | ICD-10-CM

## 2021-01-15 MED ORDER — NICOTINE 14 MG/24HR TD PT24: 14.0000 mg | MEDICATED_PATCH | Freq: Every day | TRANSDERMAL | 5 refills | Status: DC

## 2021-01-17 NOTE — Progress Notes (Signed)
[facility] Note  01/22/2021     CHIEF COMPLAINT Patient presents for Retina Follow Up   HISTORY OF PRESENT ILLNESS: Brenda Franklin is a 75 y.o. female who presents to the clinic today for:   HPI    Retina Follow Up    Patient presents with  Dry AMD.  In both eyes.  This started 6 months ago.  Since onset it is gradually worsening.  I, the attending physician,  performed the HPI with the patient and updated documentation appropriately.          Comments    Pt here for 6 month retinal follow up ARMD OU. Pt states she can tell a difference in vision OS. OS has gotten blurry in the last 2 weeks. Pt reports major oral surgery 2 weeks ago, noticed vision changes then. States OD vision seems stable. No ocular pain or discomfort noticed.        Last edited by Bernarda Caffey, MD on 01/22/2021  9:23 PM. (History)    pt feels like her vision has gotten worse in the past couple of weeks when she looks at her phone, she states she had cataract sx in November 2021 OS with Dr. Eulas Post, she has not scheduled surgery yet on the right eye, pt uses AT's for dryness, but is currently out of them  Referring physician: Lonia Skinner, MD [address] [place],   [id number]  HISTORICAL INFORMATION:   Selected notes from the [facility]: Current Outpatient Medications (Ophthalmic Drugs)  Medication Sig  . atropine 1 % ophthalmic solution SMARTSIG:1 Drop(s) Left Eye (Patient not taking: Reported on 01/22/2021)  . erythromycin ophthalmic ointment SMARTSIG:In Eye(s)  . moxifloxacin (VIGAMOX) 0.5 % ophthalmic solution Place 1 drop into the left eye every 8 (eight) hours.  . prednisoLONE acetate (PRED FORTE) 1 % ophthalmic suspension Place 1 drop into the left eye 4 (four) times daily. (Patient not taking: Reported on 01/22/2021)   No current facility-administered medications for this visit. (Ophthalmic Drugs)   Current  Outpatient Medications (Other)  Medication Sig  . albuterol (VENTOLIN HFA) 108 (90 Base) MCG/ACT inhaler SMARTSIG:1 Puff(s) By Mouth Every 4 Hours PRN  . atorvastatin (LIPITOR) 40 MG tablet TAKE 1 TABLET(40 MG) BY MOUTH DAILY  . budesonide-formoterol (SYMBICORT) 80-4.5 MCG/ACT inhaler Inhale 2 puffs into the lungs 2 (two) times daily.  . Cholecalciferol (D-3-5) 125 MCG (5000 UT) capsule Take 5,000 Units by mouth daily.  . citalopram (CELEXA) 10 MG tablet Take 10 mg by mouth daily.  Marland Kitchen ezetimibe (ZETIA) 10 MG tablet Take 1 tablet (10 mg total) by mouth daily.  Marland Kitchen LORazepam (ATIVAN) 1 MG tablet Take 1 mg by mouth daily as needed. (Patient not taking: Reported on 01/01/2021)  . losartan (COZAAR) 50 MG tablet Take 50 mg by mouth daily.  . metoprolol tartrate (LOPRESSOR) 25 MG tablet TAKE 1 TABLET(25 MG) BY MOUTH TWICE DAILY  . Multiple Vitamin (MULTIVITAMIN) capsule Take 1 capsule by mouth daily.  . nicotine (NICODERM CQ - DOSED IN MG/24 HOURS) 14 mg/24hr patch Place 1 patch (14 mg total) onto the skin daily.  . sodium chloride (OCEAN) 0.65 % SOLN nasal spray Place 1 spray into both nostrils as needed for congestion.  Marland Kitchen Spacer/Aero-Holding Chambers (AEROCHAMBER MV) inhaler Use as instructed  . SPIRIVA RESPIMAT 1.25 MCG/ACT AERS INHALE 2 PUFFS INTO THE LUNGS DAILY  . zolpidem (AMBIEN) 10 MG tablet Take 10  mg by mouth at bedtime.   No current facility-administered medications for this visit. (Other)      REVIEW OF SYSTEMS: ROS    Positive for: Neurological, Skin, Eyes   Negative for: Constitutional, Gastrointestinal, Genitourinary, Musculoskeletal, HENT, Endocrine, Cardiovascular, Respiratory, Psychiatric, Allergic/Imm, Heme/Lymph   Last edited by Kingsley Spittle, COT on 01/22/2021  3:15 PM. (History)       ALLERGIES Allergies  Allergen Reactions  . Tolmetin Nausea And Vomiting  . Codeine Nausea And Vomiting and Other (See Comments)    Upset stomach  . Milk-Related Compounds Diarrhea   . Nsaids Diarrhea and Other (See Comments)    Stomach upset  . Other Diarrhea  . Oxycodone Nausea And Vomiting    PAST MEDICAL HISTORY Past Medical History:  Diagnosis Date  . BCC (basal cell carcinoma) 12/09/2012   right inner eye ([id number])  . Cataracts, bilateral   . Compression fracture of fifth lumbar vertebra ([place])   . Depression   . Extremity pain   . High cholesterol    Past Surgical History:  Procedure Laterality Date  . ABDOMINAL HYSTERECTOMY    . AUGMENTATION MAMMAPLASTY Bilateral   . BREAST ENHANCEMENT SURGERY    . COLONOSCOPY    . ESOPHAGEAL DILATION    . EXCISION VAGINAL CYST    . HEMORRHOID SURGERY    . metal stimulator for bowel control     Removed  . NOSE SURGERY      FAMILY HISTORY Family History  Problem Relation Age of Onset  . Other Mother        Intestinal problem - gangrene  . Cancer Father        Bile Duct Cancer  . Heart disease Father   . Heart attack Father   . Hypercholesterolemia Father   . Kidney cancer Sister   . Kidney cancer Brother     SOCIAL HISTORY Social History   Tobacco Use  . Smoking status: Current Every Day Smoker    Packs/day: 1.00    Types: Cigarettes  . Smokeless tobacco: Never Used  Vaping Use  . Vaping Use: Never used  Substance Use Topics  . Alcohol use: No  . Drug use: No         OPHTHALMIC EXAM:  Base Eye Exam    Visual Acuity (Snellen - Linear)      Right Left   Dist [place] 20/25 20/20 -2       Tonometry (Tonopen, 3:25 PM)      Right Left   Pressure 13 12       Pupils      Dark Light Shape React APD   Right 2 1 Round Brisk None   Left 2 1 Round Brisk None       Visual Fields (Counting fingers)      Left Right    Full Full       Extraocular Movement      Right Left    Full, Ortho Full, Ortho       Neuro/Psych    Oriented x3: Yes   Mood/Affect: Normal       Dilation    Both eyes: 1.0% Mydriacyl, 2.5% Phenylephrine @ 3:25 PM        Slit Lamp and Fundus Exam    Slit Lamp  Exam      Right Left   Lids/Lashes Dermato Dermato   Conjunctiva/Sclera White and quiet White and quiet   Cornea Arcus, trace PEE Arcus, trace tear film debris, well healed  cataract wounds, trace endo pigment   Anterior Chamber Deep and clear Deep and clear, No cell or flare   Iris Round and moderately dilated Round and moderately dilated   Lens 3-4+ NS w/brunescence; 3+ cortical PC IOL in good position   Vitreous Synerisis Synerisis       Fundus Exam      Right Left   Disc Pink, sharp, compact Pink, sharp, compact.  Mild temporal PPA.   C/D Ratio 0.1 0.1   Macula flat, Blunted foveal reflex, RPE mottling and clumping, Drusen, No heme or edema Blunted foveal reflex, Drusen, RPE mottling and clumping, No heme or edema.   Vessels Attenuated, tortuous, dilated venules Attenuated, tortuous, Mild AV crossing changes, dilated venules   Periphery Attached, Reticular degeneration, No heme Attached, Reticular degeneration, No heme          IMAGING AND PROCEDURES  Imaging and Procedures for 01/22/2021  OCT, Retina - OU - Both Eyes       Right Eye Quality was good. Central Foveal Thickness: 249. Progression has been stable. Findings include normal foveal contour, no IRF, no SRF, retinal drusen .   Left Eye Quality was good. Central Foveal Thickness: 264. Progression has been stable. Findings include normal foveal contour, no IRF, no SRF, retinal drusen .   Notes *Images captured and stored on drive  Diagnosis / Impression:  Non-exu ARMD OU - stable  Clinical management:  See below  Abbreviations: NFP - Normal foveal profile. CME - cystoid macular edema. PED - pigment epithelial detachment. IRF - intraretinal fluid. SRF - subretinal fluid. EZ - ellipsoid zone. ERM - epiretinal membrane. ORA - outer retinal atrophy. ORT - outer retinal tubulation. SRHM - subretinal hyper-reflective material. IRHM - intraretinal hyper-reflective material               ASSESSMENT/PLAN:     ICD-10-CM   1. Early dry stage nonexudative age-related macular degeneration of both eyes  H35.3131   2. Retinal edema  H35.81 OCT, Retina - OU - Both Eyes  3. Essential hypertension  I10   4. Hypertensive retinopathy of both eyes  H35.033   5. Combined forms of age-related cataract of right eye  H25.811   6. Pseudophakia  Z96.1   7. Dry eyes  H04.123     1. Age related macular degeneration, non-exudative, both eyes   - early stage w/ mild drusen -- stable  - The incidence, anatomy, and pathology of dry AMD, risk of progression, and the AREDS and AREDS 2 study including smoking risks discussed with patient.  - Recommend amsler grid monitoring             - F/u in 9 mos -- DFE/OCT  2. No retinal edema  3,4. Hypertensive retinopathy OU  - exam with dilated and tortuous venules OU - discussed importance of tight BP control. - BP in office 7.20.21: 156/72 - Recommend seeing PCP for eval and management - monitor  5. Mixed Cataract OD - The symptoms of cataract, surgical options, and treatments and risks were discussed with patient. - discussed diagnosis and progression - Visually significant - under the expert management of Dr. Eulas Post  6. Pseudophakia OS  - s/p CE/IOL (Dr. Eulas Post, 11.2021)  - IOL in good position, doing well  - monitor  7. Dry eyes OU - recommend artificial tears and lubricating ointment as needed - Most likely cause for most recent flare-up of blurred vision OS   Ophthalmic Meds Ordered this visit:  No orders  of the defined types were placed in this encounter.      Return in about 9 months (around 10/24/2021) for f/u non-exu ARMD OU, DFE, OCT.  There are no Patient Instructions on file for this visit.  This document serves as a record of services personally performed by Gardiner Sleeper, MD, PhD. It was created on their behalf by Estill Bakes, COT an ophthalmic technician. The creation of this record is the provider's dictation and/or activities during the  visit.    Electronically signed by: Estill Bakes, COT 4.20.22 @ 9:30 PM   This document serves as a record of services personally performed by Gardiner Sleeper, MD, PhD. It was created on their behalf by San Jetty. Owens Shark, OA an ophthalmic technician. The creation of this record is the provider's dictation and/or activities during the visit.    Electronically signed by: San Jetty. Owens Shark, [place] 04.25.2022 9:30 PM  Gardiner Sleeper, M.D., Ph.D. Diseases & Surgery of the Retina and [place] 01/22/2021   I have reviewed the above documentation for accuracy and completeness, and I agree with the above. Gardiner Sleeper, M.D., Ph.D. 01/22/21 9:30 PM   Abbreviations: M myopia (nearsighted); A astigmatism; H hyperopia (farsighted); P presbyopia; Mrx spectacle prescription;  CTL contact lenses; OD right eye; OS left eye; OU both eyes  XT exotropia; ET esotropia; PEK punctate epithelial keratitis; PEE punctate epithelial erosions; DES dry eye syndrome; MGD meibomian gland dysfunction; ATs artificial tears; PFAT's preservative free artificial tears; [place] nuclear sclerotic cataract; PSC posterior subcapsular cataract; ERM epi-retinal membrane; PVD posterior vitreous detachment; RD retinal detachment; DM diabetes mellitus; DR diabetic retinopathy; NPDR non-proliferative diabetic retinopathy; PDR proliferative diabetic retinopathy; CSME clinically significant macular edema; DME diabetic macular edema; dbh dot blot hemorrhages; CWS cotton wool spot; POAG primary open angle glaucoma; C/D cup-to-disc ratio; HVF humphrey visual field; GVF goldmann visual field; OCT optical coherence tomography; IOP intraocular pressure; BRVO Branch retinal vein occlusion; CRVO central retinal vein occlusion; CRAO central retinal artery occlusion; BRAO branch retinal artery occlusion; RT retinal tear; SB scleral buckle; PPV pars plana vitrectomy; VH Vitreous hemorrhage; PRP panretinal laser photocoagulation;  IVK intravitreal kenalog; VMT vitreomacular traction; MH Macular hole;  NVD neovascularization of the disc; NVE neovascularization elsewhere; AREDS age related eye disease study; ARMD age related macular degeneration; POAG primary open angle glaucoma; EBMD epithelial/anterior basement membrane dystrophy; ACIOL anterior chamber intraocular lens; IOL intraocular lens; PCIOL posterior chamber intraocular lens; Phaco/IOL phacoemulsification with intraocular lens placement; [place] photorefractive keratectomy; LASIK laser assisted in situ keratomileusis; HTN hypertension; DM diabetes mellitus; COPD chronic obstructive pulmonary disease

## 2021-01-18 DIAGNOSIS — R002 Palpitations: Secondary | ICD-10-CM | POA: Diagnosis not present

## 2021-01-22 ENCOUNTER — Ambulatory Visit (INDEPENDENT_AMBULATORY_CARE_PROVIDER_SITE_OTHER): Payer: Medicare Other | Admitting: Ophthalmology

## 2021-01-22 ENCOUNTER — Encounter (INDEPENDENT_AMBULATORY_CARE_PROVIDER_SITE_OTHER): Payer: Self-pay | Admitting: Ophthalmology

## 2021-01-22 ENCOUNTER — Other Ambulatory Visit: Payer: Self-pay

## 2021-01-22 DIAGNOSIS — H04123 Dry eye syndrome of bilateral lacrimal glands: Secondary | ICD-10-CM | POA: Diagnosis not present

## 2021-01-22 DIAGNOSIS — Z961 Presence of intraocular lens: Secondary | ICD-10-CM

## 2021-01-22 DIAGNOSIS — I10 Essential (primary) hypertension: Secondary | ICD-10-CM

## 2021-01-22 DIAGNOSIS — H353131 Nonexudative age-related macular degeneration, bilateral, early dry stage: Secondary | ICD-10-CM

## 2021-01-22 DIAGNOSIS — H35033 Hypertensive retinopathy, bilateral: Secondary | ICD-10-CM

## 2021-01-22 DIAGNOSIS — H25811 Combined forms of age-related cataract, right eye: Secondary | ICD-10-CM

## 2021-01-22 DIAGNOSIS — H3581 Retinal edema: Secondary | ICD-10-CM

## 2021-01-22 DIAGNOSIS — H25813 Combined forms of age-related cataract, bilateral: Secondary | ICD-10-CM

## 2021-02-05 DIAGNOSIS — R002 Palpitations: Secondary | ICD-10-CM | POA: Diagnosis not present

## 2021-02-06 NOTE — Progress Notes (Signed)
Called and spoke to pt regarding monitor results. Pt voiced understanding.

## 2021-03-26 ENCOUNTER — Encounter: Payer: Self-pay | Admitting: Student

## 2021-03-26 ENCOUNTER — Telehealth: Payer: Self-pay | Admitting: Student

## 2021-03-26 DIAGNOSIS — R072 Precordial pain: Secondary | ICD-10-CM | POA: Diagnosis not present

## 2021-03-26 DIAGNOSIS — I2510 Atherosclerotic heart disease of native coronary artery without angina pectoris: Secondary | ICD-10-CM

## 2021-03-26 DIAGNOSIS — I719 Aortic aneurysm of unspecified site, without rupture: Secondary | ICD-10-CM

## 2021-03-26 NOTE — Telephone Encounter (Signed)
ON-CALL CARDIOLOGY 03/26/21  Patient's name: Brenda Franklin.   MRN: [id number].    DOB: [birth year] Primary care provider: [name], MD. Primary cardiologist: Dr. Jorge Ny regarding this patient's care today: Patient and her daughter called with concerns that patient developed sharp chest pain yesterday.  She states this pain was intermittent throughout the day starting in the morning and relieved around 10 PM.  She states she had intermittent sharp pains in her left breast, which then migrated to her right breast.  She states this pain started when she was doing normal household activities straightening up her kitchen and taking out the trash.  This pain was not relieved by rest.  Patient was able to sleep throughout the night and woke up without chest pain.  She notes she was mildly short of breath this morning which improved with use of Symbicort inhaler.  She states she is no longer having chest pain today, however states her chest "feels tired".  She continues to have very mild substernal chest discomfort.  Patient denies other associated symptoms.  Unfortunately no blood pressure or heart rate readings are available.  Notably review of patient's chart shows she has elevated coronary calcium score as well as mildly abnormal stress test with small mild intensity reversible perfusion defect in basal inferoseptal myocardium.  Patient also has history of hypertension, tobacco dependence, and penetrating ulcer of infrarenal abdominal aorta.  Impression:   ICD-10-CM   1. Precordial pain  R07.2     2. Coronary artery disease involving native coronary artery of native heart without angina pectoris  I25.10     3. Penetrating ulcer of aorta (HCC)  I71.9       No orders of the defined types were placed in this encounter.   No orders of the defined types were placed in this encounter.   Recommendations: Given patient's tobacco use, elevated coronary calcium score and abnormal stress  test, as well as penetrating ulcer of abdominal aorta, discussed with patient that cannot rule out cardiovascular etiology of her chest discomfort symptoms.  Discussed with patient management options including emergent evaluation in the emergency department in order to rule out cardiovascular etiology.  However patient is hesitant to pursue further evaluation in the emergency department at this time.  Patient prefers to follow-up closely in our office.  We will therefore set patient up for stat appointment in our office tomorrow morning.  Counseled patient and her daughter at length regarding signs and symptoms that would warrant emergent evaluation, patient verbalized understanding and agreement.  Patient agrees to go to the emergency department if symptoms worsen.  Telephone encounter total time: 16 minutes     Alethia Berthold, PA-C 03/26/2021, 6:33 PM Office: [phone]

## 2021-03-27 ENCOUNTER — Encounter: Payer: Self-pay | Admitting: Student

## 2021-03-27 ENCOUNTER — Other Ambulatory Visit: Payer: Self-pay | Admitting: Cardiology

## 2021-03-27 ENCOUNTER — Other Ambulatory Visit: Payer: Self-pay

## 2021-03-27 ENCOUNTER — Ambulatory Visit: Payer: Medicare Other | Admitting: Student

## 2021-03-27 VITALS — BP 136/68 | HR 60 | Temp 98.2°F | Ht 64.0 in | Wt 141.0 lb

## 2021-03-27 DIAGNOSIS — I2510 Atherosclerotic heart disease of native coronary artery without angina pectoris: Secondary | ICD-10-CM | POA: Diagnosis not present

## 2021-03-27 DIAGNOSIS — I719 Aortic aneurysm of unspecified site, without rupture: Secondary | ICD-10-CM | POA: Diagnosis not present

## 2021-03-27 DIAGNOSIS — R072 Precordial pain: Secondary | ICD-10-CM | POA: Diagnosis not present

## 2021-03-27 MED ORDER — ATORVASTATIN CALCIUM 40 MG PO TABS
ORAL_TABLET | ORAL | 3 refills | Status: DC
Start: 2021-03-27 — End: 2021-11-05

## 2021-03-27 NOTE — Progress Notes (Signed)
Patient referred by Brenda Gravel, MD for back pain, possible angina equivalent  Subjective:   Brenda Franklin, female    DOB: 1946/05/26, 75 y.o.   MRN: [id number]   Chief Complaint  Patient presents with   Chest Pain   Follow-up    HPI  75 y.o. Caucasian female with hypertension, hyperlipidemia, tobacco dependence, coronary artery disease, penetrating ulcer of infrarenal abdominal aorta.   Patient was seen by vascular surgeon Dr. Carlis Abbott who felt patient was asymptomatic and can be followed with serial CT monitoring.  In future, she may need abdominal aorta stent graft, especially there is any aneurysmal changes.  Patient presents for urgent visit with concerns of chest pain and fatigue. Patient called on call service last night and was advised to go the the ED for further evaluation given elevated coronary calcium score and abnormal stress test. However, patient refused to go to the ED, therefore she is being evaluated on a stat basis in the office today.   Patient reports intermittent episodes of sharp chest pain on the left lateral chest wall which then migrated to the right lateral chest wall 2 days ago.  She denies associated symptoms and she has had no recurrence of these episodes in the last 24 to 36 hours.  She is presently chest pain-free.  Yesterday she states she felt fatigued and as if her "chest was tired".  Denies dizziness, dyspnea, syncope, near syncope.  Denies orthopnea, PND, leg swelling.  Patient is symptom-free today.  She is accompanied by her daughter who assists in providing history.  Notably about 1 week ago patient discontinued all antihypertensive medications as she felt she no longer needed them because she had made diet modifications.  She resumed blood pressure medications approximately 1 week ago.  She also unfortunately continues to smoke approximately a pack per day.  Current Outpatient Medications on File Prior to Visit  Medication Sig Dispense Refill    albuterol (VENTOLIN HFA) 108 (90 Base) MCG/ACT inhaler SMARTSIG:1 Puff(s) By Mouth Every 4 Hours PRN     budesonide-formoterol (SYMBICORT) 80-4.5 MCG/ACT inhaler Inhale 2 puffs into the lungs 2 (two) times daily. 1 each 5   Cholecalciferol (D-3-5) 125 MCG (5000 UT) capsule Take 5,000 Units by mouth daily.     citalopram (CELEXA) 10 MG tablet Take 10 mg by mouth daily.     erythromycin ophthalmic ointment SMARTSIG:In Eye(s)     ezetimibe (ZETIA) 10 MG tablet Take 1 tablet (10 mg total) by mouth daily. 90 tablet 3   losartan (COZAAR) 50 MG tablet Take 50 mg by mouth daily.     metoprolol tartrate (LOPRESSOR) 25 MG tablet TAKE 1 TABLET(25 MG) BY MOUTH TWICE DAILY 120 tablet 3   moxifloxacin (VIGAMOX) 0.5 % ophthalmic solution Place 1 drop into the left eye every 8 (eight) hours.     Multiple Vitamin (MULTIVITAMIN) capsule Take 1 capsule by mouth daily.     Multiple Vitamins-Minerals (ICAPS AREDS 2 PO) Take by mouth.     prednisoLONE acetate (PRED FORTE) 1 % ophthalmic suspension Place 1 drop into the left eye 4 (four) times daily.     sodium chloride (OCEAN) 0.65 % SOLN nasal spray Place 1 spray into both nostrils as needed for congestion. 60 mL 2   Spacer/Aero-Holding Chambers (AEROCHAMBER MV) inhaler Use as instructed 1 each 0   SPIRIVA RESPIMAT 1.25 MCG/ACT AERS INHALE 2 PUFFS INTO THE LUNGS DAILY 4 g 5   zolpidem (AMBIEN) 10 MG tablet Take 10 mg by  mouth at bedtime.     No current facility-administered medications on file prior to visit.    Cardiovascular and other pertinent studies: EKG 03/27/2021: Sinus bradycardia at a rate of 50 bpm.  Normal axis.  No evidence of ischemia or underlying injury pattern.  EKG 01/01/2021: Sinus rhythm 63 bpm Normal EKG  CTA abdomen 09/2020: 1. Penetrating atherosclerotic ulceration arising from the left lateral wall of the infrarenal abdominal aorta results in focal aneurysmal dilation of the aorta with a maximal diameter of 2.4 cm (1.5 times the  diameter of the adjacent normal aorta which is measured at 1.6 cm). Due to the suspected etiology of this abnormality, traditional follow-up recommendations for abdominal aortic aneurysm may not be adequate. Consider initiation of statin therapy to stabilize the underlying plaque as well as initial follow-up CT arteriogram in 6 months. Aortic Atherosclerosis (ICD10-I70.0); Aortic aneurysm NOS (ICD10-I71.9). 2. Coronary artery calcifications.   NON-VASCULAR   1. Simple appearing 3.4 cm left ovarian cystic lesion, likely benign. Recommend follow-up US in 6-12 months. Note: This recommendation does not apply to premenarchal patients and to those with increased risk (genetic, family history, elevated tumor markers or other high-risk factors) of ovarian cancer. Reference: JACR 2020 Feb; 17(2):248-254 2. 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 3. Colonic diverticular disease without CT evidence of active inflammation. 4. Chronic T12 and L4 compression fractures.  Lexiscan/modified Bruce Tetrofosmin stress test 06/21/2020: Lexiscan/modified Bruce nuclear stress test performed using 1-day protocol. Stress EKG is non-diagnostic, as this is pharmacological stress test. In addition, stress EKG at 82% MPHR showed no ischemic changes.  Small sized, mild intensity, reversible perfusion defect in basal inferoseptal myocardium.  Low risk study.  Stress LVEF 69%.  Echocardiogram 06/20/2020:  Left ventricle cavity is normal in size and wall thickness. Normal global  wall motion. Normal LV systolic function with EF 55%. Doppler evidence of  grade I (impaired) diastolic dysfunction, normal LAP. Calculated EF 55%.  Trileaflet aortic valve.  Trace aortic regurgitation.  Mild mitral annular calcification. Trace mitral stenosis. Moderate (Grade  II) mitral regurgitation.  Inadequate TR jet to estimate pulmonary artery systolic pressure. Normal  right atrial pressure.   ABI  06/19/2020:  This exam reveals mildly decreased perfusion of the right lower extremity,  noted at the anterior tibial and post tibial artery level (ABI 0.88) and  mildly decreased perfusion of the left lower extremity, noted at the post  tibial artery level (ABI 0.88).  Mildly abnormal biphasic waveforms at the  bilateral AT.   CT Cardiac scoring 06/01/2020: Total score: 867 LM: 94 LAD: 501 LCx: 0 RCA: 272  EKG 05/25/2020: Sinus rhythm 62 bpm Normal EKG  Recent labs: 12/26/2020: Chol 194, TG 158, HDL 68, LDL 99  06/22/2020: Chol 196, TG 111, HDL 62, LDL 114  05/15/2020: EGFR 73 Chol N/A, TG 160, HDL 68, LDL 207 TSH 2.9 normal   Review of Systems  Constitutional: Positive for malaise/fatigue.  Cardiovascular:  Positive for chest pain (intermittent sharp 2 days ago). Negative for claudication, leg swelling, near-syncope, orthopnea, palpitations, paroxysmal nocturnal dyspnea and syncope.  Respiratory:  Negative for shortness of breath.   Neurological:  Negative for dizziness.        Vitals:   03/27/21 1041  BP: 136/68  Pulse: 60  Temp: 98.2 F (36.8 C)  SpO2: 98%     Body mass index is 24.2 kg/m. Filed Weights   03/27/21 1041  Weight: 141 lb (64 kg)     Objective:  Physical Exam Vitals reviewed.  HENT:     Head: Normocephalic and atraumatic.  Cardiovascular:     Rate and Rhythm: Normal rate and regular rhythm.     Pulses: Intact distal pulses.          Carotid pulses are 2+ on the right side and 2+ on the left side.      Radial pulses are 2+ on the right side and 2+ on the left side.       Dorsalis pedis pulses are 0 on the right side and 0 on the left side.       Posterior tibial pulses are 1+ on the right side and 1+ on the left side.     Heart sounds: S1 normal and S2 normal. No murmur heard.   No gallop.  Pulmonary:     Effort: Pulmonary effort is normal. No respiratory distress.     Breath sounds: No wheezing, rhonchi or rales.  Chest:     Musculoskeletal:       Arms:     Right lower leg: No edema.     Left lower leg: No edema.  Neurological:     Mental Status: She is alert.        Assessment & Recommendations:   75 y.o. Caucasian female with hypertension, hyperlipidemia, tobacco dependence, coronary artery disease, penetrating ulcer of infrarenal abdominal aorta  Precordial Pain:  Patient's symptoms and exam are consistent with musculoskeletal etiology of her chest pain.  Patient's EKG is unchanged compared to previous.  She has point tenderness in multiple places on her chest wall.  Reassured patient my suspicion is low that this is related to underlying cardiac etiology.  However counseled patient and her daughter regarding signs and symptoms that would warrant urgent or emergent evaluation.  Patient and her daughter both verbalized understanding agreement.  Penetrating ulcer of aorta: Continue statin therapy Continue follow-up with vascular surgery.  Plan to repeat CTA next month.  CAD: Elevated calcium score and mildly abnormal stress test. Given patient's known CAD recommended stat appointment to evaluate precordial pain.  However patient's symptoms are atypical and EKG and physical exam are reassuring for musculoskeletal etiology. Continue Aspirin 81 mg, metoprolol tartarate 25 mg bid, lipitor 40 mg, Zetia 10 mg daily. She did not tolerate Lipitor 80 mg due to symptoms of myalgia. Patient will notify our office of new or worsening symptomatology, could consider coronary angiogram as needed.  Nicotine dependence: Tobacco cessation counseling: Currently smoking approximately a pack per day.  Spent 4 minutes of today's office visit counseling patient regarding tobacco cessation.  Discussed with patient regarding importance of medication compliance.   Follow up as previously scheduled in August 2022.    Alethia Berthold, PA-C 03/27/2021, 2:49 PM Office: [phone]

## 2021-04-07 ENCOUNTER — Other Ambulatory Visit: Payer: Self-pay | Admitting: Primary Care

## 2021-05-01 ENCOUNTER — Other Ambulatory Visit (HOSPITAL_COMMUNITY): Payer: Self-pay | Admitting: Cardiology

## 2021-05-01 DIAGNOSIS — I10 Essential (primary) hypertension: Secondary | ICD-10-CM | POA: Diagnosis not present

## 2021-05-02 LAB — BASIC METABOLIC PANEL
BUN/Creatinine Ratio: 25 (ref 12–28)
BUN: 23 mg/dL (ref 8–27)
CO2: 24 mmol/L (ref 20–29)
Calcium: 9.2 mg/dL (ref 8.7–10.3)
Chloride: 104 mmol/L (ref 96–106)
Creatinine, Ser: 0.91 mg/dL (ref 0.57–1.00)
Glucose: 78 mg/dL (ref 65–99)
Potassium: 4.8 mmol/L (ref 3.5–5.2)
Sodium: 141 mmol/L (ref 134–144)
eGFR: 65 mL/min/{1.73_m2} (ref >59)

## 2021-05-04 ENCOUNTER — Other Ambulatory Visit: Payer: Self-pay

## 2021-05-04 ENCOUNTER — Ambulatory Visit: Payer: Medicare Other | Admitting: Cardiology

## 2021-05-04 ENCOUNTER — Encounter: Payer: Self-pay | Admitting: Cardiology

## 2021-05-04 VITALS — BP 122/72 | HR 70 | Temp 98.0°F | Resp 16 | Ht 64.0 in | Wt 140.0 lb

## 2021-05-04 DIAGNOSIS — I10 Essential (primary) hypertension: Secondary | ICD-10-CM

## 2021-05-04 DIAGNOSIS — F17210 Nicotine dependence, cigarettes, uncomplicated: Secondary | ICD-10-CM

## 2021-05-04 DIAGNOSIS — I2510 Atherosclerotic heart disease of native coronary artery without angina pectoris: Secondary | ICD-10-CM | POA: Diagnosis not present

## 2021-05-04 DIAGNOSIS — E782 Mixed hyperlipidemia: Secondary | ICD-10-CM

## 2021-05-04 NOTE — Progress Notes (Signed)
Patient referred by [name], MD for back pain, possible angina equivalent  Subjective:   Brenda Franklin, female    DOB: 1946-07-28, 75 y.o.   MRN: [id number]   Chief Complaint  Patient presents with   Coronary Artery Disease   Hyperlipidemia   Follow-up    HPI  75 y.o. Caucasian female with hypertension, hyperlipidemia, tobacco dependence, coronary artery disease, penetrating ulcer of infrarenal abdominal aorta  Patient has not had any recurrent exertional chest pain.  She continues to have low back pain, which is worse with physical activity.  Unfortunately, she continues to smoke half to three quarters of pack every day.  Patient recently underwent lipid panel, results not available to me.   Current Outpatient Medications on File Prior to Visit  Medication Sig Dispense Refill   albuterol (VENTOLIN HFA) 108 (90 Base) MCG/ACT inhaler SMARTSIG:1 Puff(s) By Mouth Every 4 Hours PRN     atorvastatin (LIPITOR) 40 MG tablet TAKE 1 TABLET(40 MG) BY MOUTH DAILY 90 tablet 3   Cholecalciferol (D-3-5) 125 MCG (5000 UT) capsule Take 5,000 Units by mouth daily.     citalopram (CELEXA) 10 MG tablet Take 10 mg by mouth daily.     erythromycin ophthalmic ointment SMARTSIG:In Eye(s)     ezetimibe (ZETIA) 10 MG tablet Take 1 tablet (10 mg total) by mouth daily. 90 tablet 3   losartan (COZAAR) 50 MG tablet Take 50 mg by mouth daily.     metoprolol tartrate (LOPRESSOR) 25 MG tablet TAKE 1 TABLET(25 MG) BY MOUTH TWICE DAILY 120 tablet 3   moxifloxacin (VIGAMOX) 0.5 % ophthalmic solution Place 1 drop into the left eye every 8 (eight) hours.     Multiple Vitamin (MULTIVITAMIN) capsule Take 1 capsule by mouth daily.     Multiple Vitamins-Minerals (ICAPS AREDS 2 PO) Take by mouth.     prednisoLONE acetate (PRED FORTE) 1 % ophthalmic suspension Place 1 drop into the left eye 4 (four) times daily.     sodium chloride (OCEAN) 0.65 % SOLN nasal spray Place 1 spray into both nostrils as needed for congestion.  60 mL 2   Spacer/Aero-Holding Chambers (AEROCHAMBER MV) inhaler Use as instructed 1 each 0   SPIRIVA RESPIMAT 1.25 MCG/ACT AERS INHALE 2 PUFFS INTO THE LUNGS DAILY 4 g 5   SYMBICORT 80-4.5 MCG/ACT inhaler INHALE 2 PUFFS INTO THE LUNGS TWICE DAILY 10.2 g 3   zolpidem (AMBIEN) 10 MG tablet Take 10 mg by mouth at bedtime.     No current facility-administered medications on file prior to visit.    Cardiovascular and other pertinent studies:  EKG 01/01/2021: Sinus rhythm 63 bpm Normal EKG  CTA abdomen 09/2020: 1. Penetrating atherosclerotic ulceration arising from the left lateral wall of the infrarenal abdominal aorta results in focal aneurysmal dilation of the aorta with a maximal diameter of 2.4 cm (1.5 times the diameter of the adjacent normal aorta which is measured at 1.6 cm). Due to the suspected etiology of this abnormality, traditional follow-up recommendations for abdominal aortic aneurysm may not be adequate. Consider initiation of statin therapy to stabilize the underlying plaque as well as initial follow-up CT arteriogram in 6 months. Aortic Atherosclerosis (ICD10-I70.0); Aortic aneurysm NOS (ICD10-I71.9). 2. Coronary artery calcifications.   NON-VASCULAR   1. Simple appearing 3.4 cm left ovarian cystic lesion, likely benign. Recommend follow-up US in 6-12 months. Note: This recommendation does not apply to premenarchal patients and to those with increased risk (genetic, family history, elevated tumor markers or other high-risk  factors) of ovarian cancer. Reference: JACR 2020 Feb; 17(2):248-254 2. 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 3. Colonic diverticular disease without CT evidence of active inflammation. 4. Chronic T12 and L4 compression fractures.  Lexiscan/modified Bruce Tetrofosmin stress test 06/21/2020: Lexiscan/modified Bruce nuclear stress test performed using 1-day protocol. Stress EKG is non-diagnostic, as this is  pharmacological stress test. In addition, stress EKG at 82% MPHR showed no ischemic changes.  Small sized, mild intensity, reversible perfusion defect in basal inferoseptal myocardium.  Low risk study.  Stress LVEF 69%.  Echocardiogram 06/20/2020:  Left ventricle cavity is normal in size and wall thickness. Normal global  wall motion. Normal LV systolic function with EF 55%. Doppler evidence of  grade I (impaired) diastolic dysfunction, normal LAP. Calculated EF 55%.  Trileaflet aortic valve.  Trace aortic regurgitation.  Mild mitral annular calcification. Trace mitral stenosis. Moderate (Grade  II) mitral regurgitation.  Inadequate TR jet to estimate pulmonary artery systolic pressure. Normal  right atrial pressure.   ABI 06/19/2020:  This exam reveals mildly decreased perfusion of the right lower extremity,  noted at the anterior tibial and post tibial artery level (ABI 0.88) and  mildly decreased perfusion of the left lower extremity, noted at the post  tibial artery level (ABI 0.88).  Mildly abnormal biphasic waveforms at the  bilateral AT.   CT Cardiac scoring 06/01/2020: Total score: 867 LM: 94 LAD: 501 LCx: 0 RCA: 272  EKG 05/25/2020: Sinus rhythm 62 bpm Normal EKG  Recent labs: 05/01/2021: Glucose 78, BUN/Cr 23/0.91. EGFR 65. Na/K 141/4.8.   11/2020: Chol 194, TG 158, HDL 68, LDL 99  06/22/2020: Chol 196, TG 111, HDL 62, LDL 114  05/15/2020: EGFR 73 Chol N/A, TG 160, HDL 68, LDL 207 TSH 2.9 normal   Review of Systems  Cardiovascular:  Negative for chest pain, dyspnea on exertion, leg swelling, palpitations and syncope.  Musculoskeletal:  Positive for back pain.        Vitals:   05/04/21 1307  BP: 122/72  Pulse: 70  Resp: 16  Temp: 98 F (36.7 C)  SpO2: 97%     Body mass index is 24.03 kg/m. Filed Weights   05/04/21 1307  Weight: 140 lb (63.5 kg)     Objective:   Physical Exam Vitals and nursing note reviewed.  Constitutional:      General:  She is not in acute distress. Neck:     Vascular: No JVD.  Cardiovascular:     Rate and Rhythm: Normal rate and regular rhythm.     Pulses:          Dorsalis pedis pulses are 0 on the right side and 0 on the left side.       Posterior tibial pulses are 1+ on the right side and 1+ on the left side.     Heart sounds: Normal heart sounds. No murmur heard. Pulmonary:     Effort: Pulmonary effort is normal.     Breath sounds: Normal breath sounds. No wheezing or rales.  Musculoskeletal:     Right lower leg: No edema.     Left lower leg: No edema.        Assessment & Recommendations:   75 y.o. Caucasian female with hypertension, hyperlipidemia, tobacco dependence, coronary artery disease, penetrating ulcer of infrarenal abdominal aorta   Penetrating ulcer of aorta: Ordered repeat CTA pending.  Continue follow-up with vascular surgery.  CAD: Elevated calcium score and mildly abnormal stress test.  No overt  angina/angina equivalent symptoms.  I suspect her back pain symptoms are most likely musculoskeletal in etiology, specifically chronic L4 compression fracture. Recommend Aspirin 81 mg, metoprolol tartarate 25 mg bid, lipitor 40 mg, Zetia 10 mg daily. Patient is reluctant to take Aspirin, as it reportedly caused diarrhea. She is willing to take it every other day. She did not tolerate Lipitor 80 mg due to symptoms of myalgia. Lipid panel pending.  Mixed hyperlipidemia: Continue Lipitor 40, Zetia 10 mg daily. Lipid panel pending.  Abnormal peripheral pulse: Mildly abnormal ABI. Recommend risk factor modification, tobacco cessation, regular walking.   Nicotine dependence: Tobacco cessation counseling:  - Currently smoking 1/2-3/4 packs/day   - Patient was informed of the dangers of tobacco abuse including stroke, cancer, and MI, as well as benefits of tobacco cessation. - Patient is willing to quit at this time. - Approximately 5 mins were spent counseling patient cessation  techniques. We discussed various methods to help quit smoking, including deciding on a date to quit, joining a support group, pharmacological agents. Patient would like to use nicotine patch. - I will reassess her progress at the next follow-up visit   F/u in 6 months   Nigel Mormon, MD Pager: [phone] Office: [phone]

## 2021-06-15 ENCOUNTER — Other Ambulatory Visit: Payer: Self-pay

## 2021-06-15 ENCOUNTER — Encounter: Payer: Self-pay | Admitting: Internal Medicine

## 2021-06-15 ENCOUNTER — Ambulatory Visit (INDEPENDENT_AMBULATORY_CARE_PROVIDER_SITE_OTHER): Payer: Medicare Other | Admitting: Internal Medicine

## 2021-06-15 VITALS — BP 118/64 | HR 81 | Ht 64.0 in | Wt 140.8 lb

## 2021-06-15 DIAGNOSIS — I2510 Atherosclerotic heart disease of native coronary artery without angina pectoris: Secondary | ICD-10-CM | POA: Diagnosis not present

## 2021-06-15 DIAGNOSIS — J4540 Moderate persistent asthma, uncomplicated: Secondary | ICD-10-CM | POA: Diagnosis not present

## 2021-06-15 NOTE — Progress Notes (Signed)
OV 07/25/2020  Subjective:  Patient ID: Brenda Franklin, female , DOB: [birth year] , age 75 y.o. , MRN: [id number] , ADDRESS: [place] [id number] PCP [name], MD Patient Care Team: [name], MD as PCP - General (Internal Medicine) Lavonna Monarch, MD as Consulting Physician (Dermatology)  This Provider for this visit: Treatment Team:  Attending Provider: Margaretha Seeds, MD    07/25/2020 -   Chief Complaint  Patient presents with   Consult    SOB at times, back pain    Brenda Franklin 75 y.o. -75 year old female accompanied by her daughter [name].  Patient is originally from [place].  She relocated to [place] at the time of divorce with her 4 kids.  And her daughter Brenda Franklin relocated to [place] and the patient accompanied her.  Patient I think lives with the daughter.  She has hypertension, hyperlipidemia tobacco dependence and coronary artery calcification.  They have been referred for pulmonary evaluation because of shortness of breath and infrascapular pain and very mild early morning cough.  Cardiologist Dr. Virgina Jock.  History as best as I can gather is that for the last 1 year she has had this infrascapular pain that is deep and gnawing.  This happens intermittently but daily.  Over the course of the last 1 year the frequency and severity slightly more but overall is mild to moderate in severity.  When she rests it gets better.  They did see the cardiologist.  She had cardiac stress test June 21, 2020 low risk study stress ejection fraction 69%.  She also had echocardiogram June 20, 2020: It appears normal.  Her EKG is reported as normal.  She continues to smoke.  In the background it appears for the last few years she has had insidious onset of shortness of breath particularly with heavy exertion although she is able to do her ADLs.  Slight early morning cough.  They are not sure of the shortness of breath itself is gotten worse with time but possibly  so.  Review of her the labs indicate a chest x-ray August 2019: To me looks hyperinflated I personally visualized this.  Unclear if there is any findings of ILD.  08/28/2020- Interim hx   75 year old female, current smoker (30 pack year hx). (30 pack year hx). Past medical hx HTN, CAD, mixed hyperlipidemia. Patient of Dr. Chase Caller, seen for initial consult on 07/25/20 for dyspnea. She saw Dr. Chase Caller on 07/25/20 for initial consult for dyspnea on exertion. HRCT showed no evidence of ILD, small air trapping and coronary artery calcifications. Echocardiogram in September 2021 showed normal LV systolic function with EF 55%, doppler evidence of grade 1 diastolic dysfunction. Mild mitral annular calcification, trace stenosis. Moderate grade 2 mitral regurgitation. Started on Spiriva respimat 1.1mg daily for suspected COPD. Follows with cardiology.   Patient presents today for 1 month follow-up with PFTs. Accompanied by her daughter. She gets winded with moderate exertion and inclines. She has an occasional morning cough.  States that she has a deviated septum which causes her to have a hard time breathing through her nose at night. She has been using Spiriva daily but is having a difficulty administering it.  She does follow with ENT, she is considering surgical options down the line. Denies chest tightness, wheezing or cough.   Pulmonary function testing FVC 2.12 (76%), FEV1 1.68 (80%), ratio 79, TLC 134%, DLCOunc 19.05 (99%) +BD response Mild restriction with reversibility. Normal diffusion capacity.    CT chest showed no  evidence of interstitial lung disease (pulmonary fibrosis), small airway trapping and coronary artery calcifications   OV 06/15/2021  Subjective:  Patient ID: Brenda Franklin, female , DOB: July 27, 1946 , age 75 y.o. , MRN: [id number] , ADDRESS: [place] [id number] PCP Janie Morning, DO Patient Care Team: Janie Morning, DO as PCP - General (Family Medicine) Lavonna Monarch, MD as  Consulting Physician (Dermatology)  This Provider for this visit: Treatment Team:  Attending Provider: Brand Males, MD    06/15/2021 -   Chief Complaint  Patient presents with   Follow-up    Patient reports she is doing well with asthma.      HPI Brenda Franklin 75 y.o. -presents for follow-up.  Presents with her daughter Brenda Franklin who works in Insurance underwriter.  I personally saw her only 1 time last year.  Its been almost a year.  The daughter wishes only for once a year follow-up and as needed if something gets worse.  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  Diagnosed with asthma was made based on testing of pulmonary function test and CT scan.  She was prescribed Symbicort.  Currently her ACT score is 21.  Occasionally she will get a panic attack or anxiety attack and she will take Symbicort or albuterol for rescue.  This is happened a few times and this seems to help.  However daughter says overall the Symbicort is helping with the shortness of breath.  Without the Symbicort patient can deteriorate.  Patient's mostly at home.  She does some activities of daily living such as driving to the grocery store and taking care of the dog and the dishes.  Otherwise is mostly sedentary.  No nocturnal awakenings.  No wheezing no albuterol rescue use of significance.  No prednisone use no emergency visits.  No new diagnoses.  Asthma Control Test ACT Total Score  06/15/2021 21      No results found for: NITRICOXIDE   Simple office walk 185 feet x  3 laps goal with forehead probe 07/25/2020   O2 used ra  Number laps completed 3  Comments about pace average  Resting Pulse Ox/HR 97% and 80/min  Final Pulse Ox/HR 96% and 88/min  Desaturated </= 88% no  Desaturated <= 3% points no  Got Tachycardic >/= 90/min no  Symptoms at end of test No dyspnea, just back pain  Miscellaneous comments Talked duirng walk   PFT  PFT Results Latest Ref Rng & Units 08/28/2020  FVC-Pre L 1.93  FVC-Predicted Pre %  69  FVC-Post L 2.12  FVC-Predicted Post % 76  Pre FEV1/FVC % % 77  Post FEV1/FCV % % 79  FEV1-Pre L 1.49  FEV1-Predicted Pre % 71  FEV1-Post L 1.68  DLCO uncorrected ml/min/mmHg 19.05  DLCO UNC% % 99  DLCO corrected ml/min/mmHg 19.05  DLCO COR %Predicted % 99  DLVA Predicted % 129  TLC L 6.82  TLC % Predicted % 134  RV % Predicted % 195       has a past medical history of BCC (basal cell carcinoma) (12/09/2012), Cataracts, bilateral, Compression fracture of fifth lumbar vertebra ([place]), Depression, Extremity pain, and High cholesterol.   reports that she has been smoking cigarettes. She has been smoking an average of 1 pack per day. She has never used smokeless tobacco.  Past Surgical History:  Procedure Laterality Date   ABDOMINAL HYSTERECTOMY     AUGMENTATION MAMMAPLASTY Bilateral    BREAST ENHANCEMENT SURGERY     COLONOSCOPY  ESOPHAGEAL DILATION     EXCISION VAGINAL CYST     HEMORRHOID SURGERY     metal stimulator for bowel control     Removed   NOSE SURGERY      Allergies  Allergen Reactions   Tolmetin Nausea And Vomiting   Codeine Nausea And Vomiting and Other (See Comments)    Upset stomach   Milk-Related Compounds Diarrhea   Nsaids Diarrhea and Other (See Comments)    Stomach upset   Other Diarrhea   Oxycodone Nausea And Vomiting    Immunization History  Administered Date(s) Administered   PFIZER(Purple Top)SARS-COV-2 Vaccination 11/29/2019, 12/30/2019   Rabies, IM 12/05/2015, 12/05/2015, 12/07/2015, 12/11/2015, 12/18/2015    Family History  Problem Relation Age of Onset   Other Mother        Intestinal problem - gangrene   Cancer Father        Bile Duct Cancer   Heart disease Father    Heart attack Father    Hypercholesterolemia Father    Kidney cancer Sister    Kidney cancer Brother      Current Outpatient Medications:    albuterol (VENTOLIN HFA) 108 (90 Base) MCG/ACT inhaler, SMARTSIG:1 Puff(s) By Mouth Every 4 Hours PRN, Disp: , Rfl:     atorvastatin (LIPITOR) 40 MG tablet, TAKE 1 TABLET(40 MG) BY MOUTH DAILY, Disp: 90 tablet, Rfl: 3   Cholecalciferol (D-3-5) 125 MCG (5000 UT) capsule, Take 5,000 Units by mouth daily., Disp: , Rfl:    citalopram (CELEXA) 10 MG tablet, Take 10 mg by mouth daily., Disp: , Rfl:    erythromycin ophthalmic ointment, SMARTSIG:In Eye(s), Disp: , Rfl:    losartan (COZAAR) 50 MG tablet, Take 50 mg by mouth daily., Disp: , Rfl:    metoprolol tartrate (LOPRESSOR) 25 MG tablet, TAKE 1 TABLET(25 MG) BY MOUTH TWICE DAILY, Disp: 120 tablet, Rfl: 3   moxifloxacin (VIGAMOX) 0.5 % ophthalmic solution, Place 1 drop into the left eye every 8 (eight) hours., Disp: , Rfl:    Multiple Vitamin (MULTIVITAMIN) capsule, Take 1 capsule by mouth daily., Disp: , Rfl:    Multiple Vitamins-Minerals (ICAPS AREDS 2 PO), Take by mouth., Disp: , Rfl:    prednisoLONE acetate (PRED FORTE) 1 % ophthalmic suspension, Place 1 drop into the left eye 4 (four) times daily., Disp: , Rfl:    sodium chloride (OCEAN) 0.65 % SOLN nasal spray, Place 1 spray into both nostrils as needed for congestion., Disp: 60 mL, Rfl: 2   SYMBICORT 80-4.5 MCG/ACT inhaler, INHALE 2 PUFFS INTO THE LUNGS TWICE DAILY, Disp: 10.2 g, Rfl: 3   zolpidem (AMBIEN) 10 MG tablet, Take 10 mg by mouth at bedtime., Disp: , Rfl:    ezetimibe (ZETIA) 10 MG tablet, Take 1 tablet (10 mg total) by mouth daily., Disp: 90 tablet, Rfl: 3      Objective:   Vitals:   06/15/21 1417  BP: 118/64  Pulse: 81  SpO2: 97%  Weight: 140 lb 12.8 oz (63.9 kg)  Height: '5\' 4"'$  (1.626 m)    Estimated body mass index is 24.17 kg/m as calculated from the following:   Height as of this encounter: '5\' 4"'$  (1.626 m).   Weight as of this encounter: 140 lb 12.8 oz (63.9 kg).  '@WEIGHTCHANGE'$ @  [employer]   06/15/21 1417  Weight: 140 lb 12.8 oz (63.9 kg)     Physical Exam    General: No distress. Looks well Neuro: Alert and Oriented x 3. GCS 15. Speech normal Psych:  Pleasant Resp:  Barrel Chest - no.  Wheeze - no, Crackles - no, No overt respiratory distress CVS: Normal heart sounds. Murmurs - no Ext: Stigmata of Connective Tissue Disease - no HEENT: Normal upper airway. PEERL +. No post nasal drip        Assessment:     No diagnosis found.     Plan:     There are no Patient Instructions on file for this visit.    SIGNATURE    Dr. Brand Males, M.D., F.C.C.P,  Pulmonary and Critical Care Medicine Staff Physician, [place] Director - Interstitial Lung Disease  Program  P[facility] [place] at [place], [place], [address]  Pager: [phone], If no answer or between  15:00h - 7:00h: call [phone] Telephone: [phone]  2:29 PM 06/15/2021

## 2021-06-15 NOTE — Patient Instructions (Addendum)
ICD-10-CM   1. Asthma, well controlled, moderate persistent  J45.40       Good control on symbicort Improved symptoms since last visit nearly a year ago  Plan  - continue symbicort  2 puff twice daily with albuterol as needed  - respect flu shot deferral  Followup  - 1 year or sooner if needed

## 2021-06-20 ENCOUNTER — Other Ambulatory Visit: Payer: Self-pay

## 2021-06-21 ENCOUNTER — Other Ambulatory Visit: Payer: Self-pay

## 2021-06-21 DIAGNOSIS — I719 Aortic aneurysm of unspecified site, without rupture: Secondary | ICD-10-CM

## 2021-06-25 ENCOUNTER — Other Ambulatory Visit: Payer: Self-pay | Admitting: Otolaryngology

## 2021-07-05 ENCOUNTER — Ambulatory Visit (HOSPITAL_COMMUNITY)
Admission: RE | Admit: 2021-07-05 | Discharge: 2021-07-05 | Disposition: A | Discharge status: Home / Self Care | Payer: Medicare Other | Source: Ambulatory Visit | Attending: Vascular Surgery | Admitting: Vascular Surgery

## 2021-07-05 DIAGNOSIS — K5730 Diverticulosis of large intestine without perforation or abscess without bleeding: Secondary | ICD-10-CM | POA: Diagnosis not present

## 2021-07-05 DIAGNOSIS — I719 Aortic aneurysm of unspecified site, without rupture: Secondary | ICD-10-CM | POA: Diagnosis not present

## 2021-07-05 LAB — POCT I-STAT CREATININE: Creatinine, Ser: 0.9 mg/dL (ref 0.44–1.00)

## 2021-07-05 MED ORDER — IOHEXOL 350 MG/ML SOLN
100.0000 mL | Freq: Once | INTRAVENOUS | Status: AC | PRN
  Administered 2021-07-05: 100 mL via INTRAVENOUS

## 2021-07-06 ENCOUNTER — Ambulatory Visit (HOSPITAL_COMMUNITY): Payer: Medicare Other

## 2021-07-09 DIAGNOSIS — E7800 Pure hypercholesterolemia, unspecified: Secondary | ICD-10-CM | POA: Diagnosis not present

## 2021-07-09 DIAGNOSIS — R7303 Prediabetes: Secondary | ICD-10-CM | POA: Diagnosis not present

## 2021-07-09 DIAGNOSIS — R7309 Other abnormal glucose: Secondary | ICD-10-CM | POA: Diagnosis not present

## 2021-07-09 DIAGNOSIS — G4700 Insomnia, unspecified: Secondary | ICD-10-CM | POA: Diagnosis not present

## 2021-07-09 DIAGNOSIS — R946 Abnormal results of thyroid function studies: Secondary | ICD-10-CM | POA: Diagnosis not present

## 2021-07-09 DIAGNOSIS — I10 Essential (primary) hypertension: Secondary | ICD-10-CM | POA: Diagnosis not present

## 2021-07-10 DIAGNOSIS — J310 Chronic rhinitis: Secondary | ICD-10-CM | POA: Diagnosis not present

## 2021-07-10 DIAGNOSIS — J342 Deviated nasal septum: Secondary | ICD-10-CM | POA: Diagnosis not present

## 2021-07-10 DIAGNOSIS — D487 Neoplasm of uncertain behavior of other specified sites: Secondary | ICD-10-CM | POA: Diagnosis not present

## 2021-07-10 DIAGNOSIS — J343 Hypertrophy of nasal turbinates: Secondary | ICD-10-CM | POA: Diagnosis not present

## 2021-07-10 NOTE — Progress Notes (Signed)
Mutual patient. Looks stable. Please let me know if you would recommend anything else at this time.   Thanks [employer]

## 2021-07-10 NOTE — Progress Notes (Signed)
Patient daughter is asking if Losartan and Metoprolol are necessary for patient to continue taking, because she does not want her taking more than she needs. Please advise.

## 2021-07-10 NOTE — Progress Notes (Signed)
I started metoprolol to help with coronary artery disease. I wonder if losartan was started by PCP for hypertension. Is she having any side effects with these medications? Her blood pressure is well controlled due to these medications.  Were you able to share the results with Dr. Theda Sers? Please follow up.  Thanks MJP

## 2021-07-11 NOTE — Progress Notes (Signed)
I called patient daughter [name] back. NA, LMAM.

## 2021-07-12 DIAGNOSIS — N1831 Chronic kidney disease, stage 3a: Secondary | ICD-10-CM | POA: Diagnosis not present

## 2021-07-12 DIAGNOSIS — R7303 Prediabetes: Secondary | ICD-10-CM | POA: Diagnosis not present

## 2021-07-12 DIAGNOSIS — R519 Headache, unspecified: Secondary | ICD-10-CM | POA: Diagnosis not present

## 2021-07-12 DIAGNOSIS — E559 Vitamin D deficiency, unspecified: Secondary | ICD-10-CM | POA: Diagnosis not present

## 2021-07-12 DIAGNOSIS — I10 Essential (primary) hypertension: Secondary | ICD-10-CM | POA: Diagnosis not present

## 2021-07-12 DIAGNOSIS — H8110 Benign paroxysmal vertigo, unspecified ear: Secondary | ICD-10-CM | POA: Diagnosis not present

## 2021-07-12 DIAGNOSIS — F418 Other specified anxiety disorders: Secondary | ICD-10-CM | POA: Diagnosis not present

## 2021-07-12 DIAGNOSIS — E7800 Pure hypercholesterolemia, unspecified: Secondary | ICD-10-CM | POA: Diagnosis not present

## 2021-07-12 DIAGNOSIS — F5101 Primary insomnia: Secondary | ICD-10-CM | POA: Diagnosis not present

## 2021-07-12 NOTE — Progress Notes (Signed)
Called Dr. Deeann Saint office and notified Nira Conn that patient is not a candidate for surgery at [facility] d/t abdominal aortic ulcer/need to be seen by vascular surgery.

## 2021-07-12 NOTE — Progress Notes (Signed)
Spoke with patient daughter and she stated that patient already has an appointment with Dr. Theda Sers today and she will talk to him about the Losartan.

## 2021-07-17 ENCOUNTER — Ambulatory Visit: Payer: Medicare Other | Admitting: Vascular Surgery

## 2021-08-07 ENCOUNTER — Encounter (HOSPITAL_COMMUNITY): Payer: Self-pay | Admitting: Radiology

## 2021-08-08 ENCOUNTER — Other Ambulatory Visit: Payer: Self-pay | Admitting: Primary Care

## 2021-08-21 DIAGNOSIS — H353131 Nonexudative age-related macular degeneration, bilateral, early dry stage: Secondary | ICD-10-CM | POA: Diagnosis not present

## 2021-08-21 DIAGNOSIS — H2511 Age-related nuclear cataract, right eye: Secondary | ICD-10-CM | POA: Diagnosis not present

## 2021-08-21 DIAGNOSIS — H04123 Dry eye syndrome of bilateral lacrimal glands: Secondary | ICD-10-CM | POA: Diagnosis not present

## 2021-08-21 DIAGNOSIS — H35033 Hypertensive retinopathy, bilateral: Secondary | ICD-10-CM | POA: Diagnosis not present

## 2021-08-24 ENCOUNTER — Other Ambulatory Visit: Payer: Self-pay | Admitting: Cardiology

## 2021-08-27 ENCOUNTER — Other Ambulatory Visit: Payer: Self-pay | Admitting: Cardiology

## 2021-08-27 DIAGNOSIS — I2510 Atherosclerotic heart disease of native coronary artery without angina pectoris: Secondary | ICD-10-CM

## 2021-09-04 ENCOUNTER — Other Ambulatory Visit: Payer: Self-pay

## 2021-09-04 ENCOUNTER — Ambulatory Visit (INDEPENDENT_AMBULATORY_CARE_PROVIDER_SITE_OTHER): Payer: Medicare Other | Admitting: Vascular Surgery

## 2021-09-04 ENCOUNTER — Encounter: Payer: Self-pay | Admitting: Vascular Surgery

## 2021-09-04 VITALS — BP 120/70 | Ht 64.0 in | Wt 145.0 lb

## 2021-09-04 DIAGNOSIS — I719 Aortic aneurysm of unspecified site, without rupture: Secondary | ICD-10-CM

## 2021-09-04 NOTE — Progress Notes (Signed)
Virtual Visit via Telephone Note    I connected with Brenda Franklin on 09/04/2021 using the [email or web address] by telephone and verified that I was speaking with the correct person using two identifiers. Patient was located at home and accompanied by daughter. I am located at office on [address].   The limitations of evaluation and management by telemedicine and the availability of in person appointments have been previously discussed with the patient and are documented in the patients chart. The patient expressed understanding and consented to proceed.  PCP: Janie Morning, DO   Chief Complaint: Follow-up after CTA for PAU abdominal aorta  History of Present Illness: Brenda Franklin is a 75 y.o. female with history of hypertension, hyperlipidemia, tobacco abuse who presents for follow-up of penetrating aortic ulcer.  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  On follow-up today she reports no abdominal or back pain.  She was recently diagnosed with COVID.  No other concerns today.  Past Medical History:  Diagnosis Date   BCC (basal cell carcinoma) 12/09/2012   right inner eye ([id number])   Cataracts, bilateral    Compression fracture of fifth lumbar vertebra ([facility])    Depression    Extremity pain    High cholesterol     Past Surgical History:  Procedure Laterality Date   ABDOMINAL HYSTERECTOMY     AUGMENTATION MAMMAPLASTY Bilateral    BREAST ENHANCEMENT SURGERY     COLONOSCOPY     ESOPHAGEAL DILATION     EXCISION VAGINAL CYST     HEMORRHOID SURGERY     metal stimulator for bowel control     Removed   NOSE SURGERY      Current Meds  Medication Sig   albuterol (VENTOLIN HFA) 108 (90 Base) MCG/ACT inhaler SMARTSIG:1 Puff(s) By Mouth Every 4 Hours PRN   atorvastatin (LIPITOR) 40 MG tablet TAKE 1 TABLET(40 MG) BY MOUTH DAILY   Cholecalciferol (D-3-5) 125 MCG (5000 UT)  capsule Take 5,000 Units by mouth daily.   citalopram (CELEXA) 10 MG tablet Take 10 mg by mouth daily.   ezetimibe (ZETIA) 10 MG tablet TAKE 1 TABLET(10 MG) BY MOUTH DAILY   losartan (COZAAR) 50 MG tablet Take 50 mg by mouth daily.   metoprolol tartrate (LOPRESSOR) 25 MG tablet TAKE 1 TABLET(25 MG) BY MOUTH TWICE DAILY   Multiple Vitamin (MULTIVITAMIN) capsule Take 1 capsule by mouth daily.   prednisoLONE acetate (PRED FORTE) 1 % ophthalmic suspension Place 1 drop into the left eye 4 (four) times daily.   sodium chloride (OCEAN) 0.65 % SOLN nasal spray Place 1 spray into both nostrils as needed for congestion.   SYMBICORT 80-4.5 MCG/ACT inhaler INHALE 2 PUFFS INTO THE LUNGS TWICE DAILY   zolpidem (AMBIEN) 10 MG tablet Take 10 mg by mouth at bedtime.    12 system ROS was negative unless otherwise noted in HPI   Observations/Objective:  CTA abdomen pelvis reviewed from 07/05/2021 and stable PAU off the abdominal aorta with small aneurysm measuring maximal diameter 2.4 cm.  Assessment and Plan:   75 year old female presents for interval follow-up of a PAU with small abdominal aortic aneurysm discovered on CT scan from 10/03/2020.  Discussed no interval change on CTA from 06/2021 and maximal diameter remains 2.4 cm.  Would recommend continued surveillance with follow-up in 1 year with CTA.  Follow Up Instructions:   Follow up: 1  year with CTA   I discussed the assessment and treatment plan with the patient. The patient was provided an opportunity to ask questions and all were answered. The patient agreed with the plan and demonstrated an understanding of the instructions.   The patient was advised to call back or seek an in-person evaluation if the symptoms worsen or if the condition fails to improve as anticipated.  I spent 5 minutes with the patient via telephone encounter.   Signed, Marty Heck [facility] Office: [phone]  09/04/2021,  4:08 PM

## 2021-09-11 ENCOUNTER — Other Ambulatory Visit: Payer: Self-pay

## 2021-09-11 ENCOUNTER — Encounter (HOSPITAL_COMMUNITY): Payer: Self-pay | Admitting: Otolaryngology

## 2021-09-11 DIAGNOSIS — N83209 Unspecified ovarian cyst, unspecified side: Secondary | ICD-10-CM | POA: Diagnosis not present

## 2021-09-11 NOTE — Anesthesia Preprocedure Evaluation (Addendum)
Anesthesia Evaluation  Patient identified by MRN, date of birth, ID band Patient awake    Reviewed: Allergy & Precautions, NPO status , Patient's Chart, lab work & pertinent test results, reviewed documented beta blocker date and time   Airway Mallampati: III  TM Distance: >3 FB Neck ROM: Full    Dental  (+) Edentulous Upper, Dental Advisory Given, Missing,    Pulmonary asthma , Current Smoker and Patient abstained from smoking.,    Pulmonary exam normal breath sounds clear to auscultation       Cardiovascular hypertension, Pt. on home beta blockers and Pt. on medications + CAD  Normal cardiovascular exam Rhythm:Regular Rate:Normal     Neuro/Psych PSYCHIATRIC DISORDERS Depression negative neurological ROS     GI/Hepatic negative GI ROS, Neg liver ROS,   Endo/Other  negative endocrine ROS  Renal/GU negative Renal ROS  negative genitourinary   Musculoskeletal negative musculoskeletal ROS (+)   Abdominal   Peds  Hematology negative hematology ROS (+)   Anesthesia Other Findings   Reproductive/Obstetrics                            Anesthesia Physical Anesthesia Plan  ASA: 2  Anesthesia Plan: General   Post-op Pain Management:    Induction: Intravenous  PONV Risk Score and Plan: 2 and Dexamethasone, Ondansetron and Treatment may vary due to age or medical condition  Airway Management Planned: Oral ETT  Additional Equipment:   Intra-op Plan:   Post-operative Plan: Extubation in OR  Informed Consent: I have reviewed the patients History and Physical, chart, labs and discussed the procedure including the risks, benefits and alternatives for the proposed anesthesia with the patient or authorized representative who has indicated his/her understanding and acceptance.     Dental advisory given  Plan Discussed with: CRNA  Anesthesia Plan Comments:         Anesthesia Quick  Evaluation

## 2021-09-11 NOTE — Progress Notes (Signed)
PCP - Dr. Janie Morning Cardiologist - Dr. Virgina Jock EKG - 03/27/21 Chest x-ray -  ECHO - 06/20/20 Cardiac Cath -    COVID TEST- n/a  Anesthesia review: n/a  -------------  SDW INSTRUCTIONS:  Your procedure is scheduled on 12/14. Please report to [facility] Main Entrance "A" at 0730 A.M., and check in at the Admitting office. Call this number if you have problems the morning of surgery: [phone]   Remember: Do not eat or drink after midnight the night before your surgery   Medications to take morning of surgery with a sip of water include: Inhaler --- Please bring all inhalers with you the day of surgery.  atorvastatin (LIPITOR)  citalopram (CELEXA)  ezetimibe (ZETIA) metoprolol tartrate (LOPRESSOR)  cyclobenzaprine (FLEXERIL) if needed   As of today, STOP taking any Aspirin (unless otherwise instructed by your surgeon), Aleve, Naproxen, Ibuprofen, Motrin, Advil, Goody's, BC's, all herbal medications, fish oil, and all vitamins.    The Morning of Surgery Do not wear jewelry, make-up or nail polish. Do not wear lotions, powders, or perfumes, or deodorant  Do not bring valuables to the hospital. [facility] is not responsible for any belongings or valuables.  If you are a smoker, DO NOT Smoke 24 hours prior to surgery  If you wear a CPAP at night please bring your mask the morning of surgery   Remember that you must have someone to transport you home after your surgery, and remain with you for 24 hours if you are discharged the same day.  Please bring cases for contacts, glasses, hearing aids, dentures or bridgework because it cannot be worn into surgery.   Patients discharged the day of surgery will not be allowed to drive home.   Please shower the NIGHT BEFORE/MORNING OF SURGERY (use antibacterial soap like DIAL soap if possible). Wear comfortable clothes the morning of surgery. Oral Hygiene is also important to reduce your risk of infection.  Remember - BRUSH YOUR  TEETH THE MORNING OF SURGERY WITH YOUR REGULAR TOOTHPASTE  Patient denies shortness of breath, fever, cough and chest pain.

## 2021-09-12 ENCOUNTER — Ambulatory Visit (HOSPITAL_COMMUNITY): Payer: Medicare Other | Admitting: Anesthesiology

## 2021-09-12 ENCOUNTER — Ambulatory Visit (HOSPITAL_COMMUNITY)
Admission: RE | Admit: 2021-09-12 | Discharge: 2021-09-12 | Disposition: A | Discharge status: Home / Self Care | Payer: Medicare Other | Source: Ambulatory Visit | Attending: Otolaryngology | Admitting: Otolaryngology

## 2021-09-12 ENCOUNTER — Encounter (HOSPITAL_COMMUNITY)
Admission: RE | Disposition: A | Discharge status: Home / Self Care | Payer: Self-pay | Source: Ambulatory Visit | Attending: Otolaryngology

## 2021-09-12 ENCOUNTER — Encounter (HOSPITAL_COMMUNITY): Payer: Self-pay | Admitting: Otolaryngology

## 2021-09-12 ENCOUNTER — Other Ambulatory Visit: Payer: Self-pay

## 2021-09-12 DIAGNOSIS — Z01812 Encounter for preprocedural laboratory examination: Secondary | ICD-10-CM | POA: Diagnosis not present

## 2021-09-12 DIAGNOSIS — I10 Essential (primary) hypertension: Secondary | ICD-10-CM | POA: Insufficient documentation

## 2021-09-12 DIAGNOSIS — L728 Other follicular cysts of the skin and subcutaneous tissue: Secondary | ICD-10-CM | POA: Diagnosis not present

## 2021-09-12 DIAGNOSIS — L720 Epidermal cyst: Secondary | ICD-10-CM | POA: Diagnosis not present

## 2021-09-12 DIAGNOSIS — J3489 Other specified disorders of nose and nasal sinuses: Secondary | ICD-10-CM | POA: Insufficient documentation

## 2021-09-12 DIAGNOSIS — I2510 Atherosclerotic heart disease of native coronary artery without angina pectoris: Secondary | ICD-10-CM | POA: Diagnosis not present

## 2021-09-12 DIAGNOSIS — D487 Neoplasm of uncertain behavior of other specified sites: Secondary | ICD-10-CM | POA: Diagnosis not present

## 2021-09-12 DIAGNOSIS — J45909 Unspecified asthma, uncomplicated: Secondary | ICD-10-CM | POA: Diagnosis not present

## 2021-09-12 DIAGNOSIS — Z79899 Other long term (current) drug therapy: Secondary | ICD-10-CM | POA: Diagnosis not present

## 2021-09-12 DIAGNOSIS — F17200 Nicotine dependence, unspecified, uncomplicated: Secondary | ICD-10-CM | POA: Insufficient documentation

## 2021-09-12 DIAGNOSIS — J343 Hypertrophy of nasal turbinates: Secondary | ICD-10-CM | POA: Diagnosis not present

## 2021-09-12 DIAGNOSIS — J342 Deviated nasal septum: Secondary | ICD-10-CM | POA: Diagnosis not present

## 2021-09-12 HISTORY — DX: Unspecified asthma, uncomplicated: J45.909

## 2021-09-12 HISTORY — DX: Cardiac arrhythmia, unspecified: I49.9

## 2021-09-12 HISTORY — PX: MASS EXCISION: SHX2000

## 2021-09-12 HISTORY — PX: NASAL SEPTOPLASTY W/ TURBINOPLASTY: SHX2070

## 2021-09-12 LAB — CBC
HCT: 40 % (ref 36.0–46.0)
Hemoglobin: 13 g/dL (ref 12.0–15.0)
MCH: 29.8 pg (ref 26.0–34.0)
MCHC: 32.5 g/dL (ref 30.0–36.0)
MCV: 91.7 fL (ref 80.0–100.0)
Platelets: 242 10*3/uL (ref 150–400)
RBC: 4.36 MIL/uL (ref 3.87–5.11)
RDW: 12.3 % (ref 11.5–15.5)
WBC: 6.8 10*3/uL (ref 4.0–10.5)
nRBC: 0 % (ref 0.0–0.2)

## 2021-09-12 LAB — BASIC METABOLIC PANEL
Anion gap: 10 (ref 5–15)
BUN: 14 mg/dL (ref 8–23)
CO2: 21 mmol/L — ABNORMAL LOW (ref 22–32)
Calcium: 8.4 mg/dL — ABNORMAL LOW (ref 8.9–10.3)
Chloride: 107 mmol/L (ref 98–111)
Creatinine, Ser: 0.86 mg/dL (ref 0.44–1.00)
GFR, Estimated: >60 mL/min (ref >60)
Glucose, Bld: 96 mg/dL (ref 70–99)
Potassium: 3.9 mmol/L (ref 3.5–5.1)
Sodium: 138 mmol/L (ref 135–145)

## 2021-09-12 SURGERY — SEPTOPLASTY, NOSE, WITH NASAL TURBINATE REDUCTION
Anesthesia: General | Site: Nose | Laterality: Left

## 2021-09-12 MED ORDER — ORAL CARE MOUTH RINSE: 15.0000 mL | Freq: Once | OROMUCOSAL | Status: AC

## 2021-09-12 MED ORDER — LIDOCAINE 2% (20 MG/ML) 5 ML SYRINGE
INTRAMUSCULAR | Status: DC | PRN
  Administered 2021-09-12: 50 mg via INTRAVENOUS

## 2021-09-12 MED ORDER — BACITRACIN ZINC 500 UNIT/GM EX OINT
TOPICAL_OINTMENT | CUTANEOUS | Status: AC
  Filled 2021-09-12: qty 28.35

## 2021-09-12 MED ORDER — 0.9 % SODIUM CHLORIDE (POUR BTL) OPTIME
TOPICAL | Status: DC | PRN
  Administered 2021-09-12: 09:00:00 1000 mL

## 2021-09-12 MED ORDER — BACITRACIN ZINC 500 UNIT/GM EX OINT
TOPICAL_OINTMENT | CUTANEOUS | Status: DC | PRN
  Administered 2021-09-12: 1 via TOPICAL

## 2021-09-12 MED ORDER — PHENYLEPHRINE 40 MCG/ML (10ML) SYRINGE FOR IV PUSH (FOR BLOOD PRESSURE SUPPORT)
PREFILLED_SYRINGE | INTRAVENOUS | Status: AC
  Filled 2021-09-12: qty 10

## 2021-09-12 MED ORDER — LIDOCAINE-EPINEPHRINE 1 %-1:100000 IJ SOLN
INTRAMUSCULAR | Status: DC | PRN
  Administered 2021-09-12: 6 mL

## 2021-09-12 MED ORDER — ROCURONIUM BROMIDE 10 MG/ML (PF) SYRINGE
PREFILLED_SYRINGE | INTRAVENOUS | Status: DC | PRN
  Administered 2021-09-12: 60 mg via INTRAVENOUS

## 2021-09-12 MED ORDER — FENTANYL CITRATE (PF) 250 MCG/5ML IJ SOLN
INTRAMUSCULAR | Status: DC | PRN
  Administered 2021-09-12 (×5): 50 ug via INTRAVENOUS

## 2021-09-12 MED ORDER — FENTANYL CITRATE (PF) 250 MCG/5ML IJ SOLN
INTRAMUSCULAR | Status: AC
  Filled 2021-09-12: qty 5

## 2021-09-12 MED ORDER — ONDANSETRON HCL 4 MG/2ML IJ SOLN
INTRAMUSCULAR | Status: AC
  Filled 2021-09-12: qty 2

## 2021-09-12 MED ORDER — SUGAMMADEX SODIUM 200 MG/2ML IV SOLN
INTRAVENOUS | Status: DC | PRN
  Administered 2021-09-12: 200 mg via INTRAVENOUS

## 2021-09-12 MED ORDER — CEFAZOLIN SODIUM-DEXTROSE 2-3 GM-%(50ML) IV SOLR
INTRAVENOUS | Status: DC | PRN
  Administered 2021-09-12: 2 g via INTRAVENOUS

## 2021-09-12 MED ORDER — PROPOFOL 10 MG/ML IV BOLUS
INTRAVENOUS | Status: AC
  Filled 2021-09-12: qty 20

## 2021-09-12 MED ORDER — LACTATED RINGERS IV SOLN: INTRAVENOUS | Status: DC

## 2021-09-12 MED ORDER — OXYMETAZOLINE HCL 0.05 % NA SOLN
NASAL | Status: DC | PRN
  Administered 2021-09-12: 1

## 2021-09-12 MED ORDER — CEFAZOLIN SODIUM 1 G IJ SOLR
INTRAMUSCULAR | Status: AC
  Filled 2021-09-12: qty 20

## 2021-09-12 MED ORDER — OXYMETAZOLINE HCL 0.05 % NA SOLN
NASAL | Status: AC
  Filled 2021-09-12: qty 30

## 2021-09-12 MED ORDER — LIDOCAINE-EPINEPHRINE 1 %-1:100000 IJ SOLN
INTRAMUSCULAR | Status: AC
  Filled 2021-09-12: qty 1

## 2021-09-12 MED ORDER — AMOXICILLIN 875 MG PO TABS: 875.0000 mg | ORAL_TABLET | Freq: Two times a day (BID) | ORAL | 0 refills | Status: AC

## 2021-09-12 MED ORDER — FENTANYL CITRATE (PF) 100 MCG/2ML IJ SOLN: 25.0000 ug | INTRAMUSCULAR | Status: DC | PRN

## 2021-09-12 MED ORDER — DEXAMETHASONE SODIUM PHOSPHATE 10 MG/ML IJ SOLN
INTRAMUSCULAR | Status: DC | PRN
  Administered 2021-09-12: 10 mg via INTRAVENOUS

## 2021-09-12 MED ORDER — CHLORHEXIDINE GLUCONATE 0.12 % MT SOLN
15.0000 mL | Freq: Once | OROMUCOSAL | Status: AC
  Administered 2021-09-12: 08:00:00 15 mL via OROMUCOSAL
  Filled 2021-09-12: qty 15

## 2021-09-12 MED ORDER — ACETAMINOPHEN 500 MG PO TABS: 1000.0000 mg | ORAL_TABLET | Freq: Once | ORAL | Status: DC

## 2021-09-12 MED ORDER — ROCURONIUM BROMIDE 10 MG/ML (PF) SYRINGE
PREFILLED_SYRINGE | INTRAVENOUS | Status: AC
  Filled 2021-09-12: qty 10

## 2021-09-12 MED ORDER — PROPOFOL 10 MG/ML IV BOLUS
INTRAVENOUS | Status: DC | PRN
  Administered 2021-09-12: 20 mg via INTRAVENOUS
  Administered 2021-09-12: 120 mg via INTRAVENOUS

## 2021-09-12 MED ORDER — LIDOCAINE 2% (20 MG/ML) 5 ML SYRINGE
INTRAMUSCULAR | Status: AC
  Filled 2021-09-12: qty 5

## 2021-09-12 MED ORDER — ONDANSETRON HCL 4 MG/2ML IJ SOLN
INTRAMUSCULAR | Status: DC | PRN
  Administered 2021-09-12: 4 mg via INTRAVENOUS

## 2021-09-12 MED ORDER — PHENYLEPHRINE 40 MCG/ML (10ML) SYRINGE FOR IV PUSH (FOR BLOOD PRESSURE SUPPORT)
PREFILLED_SYRINGE | INTRAVENOUS | Status: DC | PRN
  Administered 2021-09-12: 160 ug via INTRAVENOUS

## 2021-09-12 MED ORDER — DEXAMETHASONE SODIUM PHOSPHATE 10 MG/ML IJ SOLN
INTRAMUSCULAR | Status: AC
  Filled 2021-09-12: qty 1

## 2021-09-12 SURGICAL SUPPLY — 49 items
BLADE SURG 15 STRL LF DISP TIS (BLADE) IMPLANT
BLADE SURG 15 STRL SS (BLADE) ×1
CANISTER SUCT 3000ML PPV (MISCELLANEOUS) ×3 IMPLANT
CLEANER TIP ELECTROSURG 2X2 (MISCELLANEOUS) ×3 IMPLANT
CNTNR URN SCR LID CUP LEK RST (MISCELLANEOUS) ×2 IMPLANT
COAGULATOR SUCT 8FR VV (MISCELLANEOUS) ×1 IMPLANT
CONT SPEC 4OZ STRL OR WHT (MISCELLANEOUS) ×1
COVER SURGICAL LIGHT HANDLE (MISCELLANEOUS) ×3 IMPLANT
DERMABOND ADVANCED (GAUZE/BANDAGES/DRESSINGS) ×1
DERMABOND ADVANCED .7 DNX12 (GAUZE/BANDAGES/DRESSINGS) ×2 IMPLANT
ELECT COATED BLADE 2.86 ST (ELECTRODE) ×3 IMPLANT
ELECT PAIRED SUBDERMAL (MISCELLANEOUS) ×3
ELECT REM PT RETURN 9FT ADLT (ELECTROSURGICAL) ×3
ELECTRODE PAIRED SUBDERMAL (MISCELLANEOUS) IMPLANT
ELECTRODE REM PT RTRN 9FT ADLT (ELECTROSURGICAL) ×2 IMPLANT
GAUZE 4X4 16PLY ~~LOC~~+RFID DBL (SPONGE) ×2 IMPLANT
GAUZE SPONGE 4X4 12PLY STRL (GAUZE/BANDAGES/DRESSINGS) ×1 IMPLANT
GLOVE SURG LTX SZ7.5 (GLOVE) ×3 IMPLANT
GOWN STRL REUS W/ TWL LRG LVL3 (GOWN DISPOSABLE) ×4 IMPLANT
GOWN STRL REUS W/ TWL XL LVL3 (GOWN DISPOSABLE) ×2 IMPLANT
GOWN STRL REUS W/TWL LRG LVL3 (GOWN DISPOSABLE) ×2
GOWN STRL REUS W/TWL XL LVL3 (GOWN DISPOSABLE) ×1
KIT BASIN OR (CUSTOM PROCEDURE TRAY) ×3 IMPLANT
KIT TURNOVER KIT B (KITS) ×3 IMPLANT
NDL HYPO 25GX1X1/2 BEV (NEEDLE) ×2 IMPLANT
NEEDLE HYPO 25GX1X1/2 BEV (NEEDLE) ×3 IMPLANT
NS IRRIG 1000ML POUR BTL (IV SOLUTION) ×3 IMPLANT
PAD ARMBOARD 7.5X6 YLW CONV (MISCELLANEOUS) ×6 IMPLANT
PENCIL SMOKE EVACUATOR (MISCELLANEOUS) ×3 IMPLANT
POSITIONER HEAD DONUT 9IN (MISCELLANEOUS) ×1 IMPLANT
PROBE NERVBE PRASS .33 (MISCELLANEOUS) ×1 IMPLANT
SHEARS HARMONIC 9CM CVD (BLADE) ×1 IMPLANT
SPLINT NASAL DOYLE BI-VL (GAUZE/BANDAGES/DRESSINGS) ×3 IMPLANT
SPONGE NEURO XRAY DETECT 1X3 (DISPOSABLE) ×3 IMPLANT
SUT CHROMIC 4 0 P 3 18 (SUTURE) ×3 IMPLANT
SUT CHROMIC 4 0 SH 27 (SUTURE) ×3 IMPLANT
SUT ETHILON 4 0 PS 2 18 (SUTURE) ×3 IMPLANT
SUT ETHILON 5 0 P 3 18 (SUTURE) ×1
SUT NYLON ETHILON 5-0 P-3 1X18 (SUTURE) ×2 IMPLANT
SUT PLAIN 4 0 ~~LOC~~ 1 (SUTURE) ×3 IMPLANT
SUT PROLENE 3 0 PS 2 (SUTURE) ×3 IMPLANT
SUT SILK 4 0 (SUTURE) ×1
SUT SILK 4-0 18XBRD TIE 12 (SUTURE) ×2 IMPLANT
SUT VIC AB 4-0 PS2 27 (SUTURE) ×1 IMPLANT
TOWEL GREEN STERILE FF (TOWEL DISPOSABLE) ×3 IMPLANT
TRAY ENT MC OR (CUSTOM PROCEDURE TRAY) ×3 IMPLANT
TUBE SALEM SUMP 16 FR W/ARV (TUBING) ×1 IMPLANT
WATER STERILE IRR 1000ML POUR (IV SOLUTION) ×3 IMPLANT
YANKAUER SUCT BULB TIP NO VENT (SUCTIONS) ×1 IMPLANT

## 2021-09-12 NOTE — Discharge Instructions (Addendum)

## 2021-09-12 NOTE — Anesthesia Procedure Notes (Signed)
Procedure Name: Intubation Date/Time: 09/12/2021 8:49 AM Performed by: Ardyth Harps, CRNA Pre-anesthesia Checklist: Patient identified, Emergency Drugs available, Suction available and Patient being monitored Patient Re-evaluated:Patient Re-evaluated prior to induction Oxygen Delivery Method: Circle System Utilized Preoxygenation: Pre-oxygenation with 100% oxygen Induction Type: IV induction Ventilation: Mask ventilation without difficulty and Oral airway inserted - appropriate to patient size Laryngoscope Size: Mac and 3 Grade View: Grade I Tube type: Oral Tube size: 7.0 mm Number of attempts: 1 Airway Equipment and Method: Stylet and Oral airway Placement Confirmation: ETT inserted through vocal cords under direct vision, positive ETCO2 and breath sounds checked- equal and bilateral Secured at: 20 cm Tube secured with: Tape Dental Injury: Teeth and Oropharynx as per pre-operative assessment

## 2021-09-12 NOTE — Op Note (Signed)
DATE OF PROCEDURE: 09/12/2021  OPERATIVE REPORT   SURGEON: Leta Baptist, MD   PREOPERATIVE DIAGNOSES:  1. Severe nasal septal deviation.  2. Bilateral inferior turbinate hypertrophy.  3. Chronic nasal obstruction. 4. Left neck mass.  POSTOPERATIVE DIAGNOSES:  1. Severe nasal septal deviation.  2. Bilateral inferior turbinate hypertrophy.  3. Chronic nasal obstruction. 4. Left neck mass.  PROCEDURE PERFORMED:  1. Septoplasty.  2. Bilateral partial inferior turbinate resection.  3. Excision of 3.5 cm left subcutaneous mass.   ANESTHESIA: General endotracheal tube anesthesia.   COMPLICATIONS: None.   ESTIMATED BLOOD LOSS: 50 mL.   INDICATION FOR PROCEDURE: Brenda Franklin is a 75 y.o. female with a history of chronic nasal obstruction. The patient was treated with antihistamine, decongestant, and steroid nasal sprays. However, the patient continued to be symptomatic. On examination, the patient was noted to have bilateral severe inferior turbinate hypertrophy and significant nasal septal deviation, causing significant nasal obstruction. In addition, she also has a large left neck mass, with the appearance suggestive of a sebaceous cyst. Based on the above findings, the decision was made for the patient to undergo the above-stated procedures. The risks, benefits, alternatives, and details of the procedures were discussed with the patient. Questions were invited and answered. Informed consent was obtained.   DESCRIPTION OF PROCEDURE: The patient was taken to the operating room and placed supine on the operating table. General endotracheal tube anesthesia was administered by the anesthesiologist. The patient was positioned, and prepped and draped in the standard fashion for nasal surgery. Pledgets soaked with Afrin were placed in both nasal cavities for decongestion. The pledgets were subsequently removed.   Examination of the nasal cavity revealed a severe nasal septal deviation. 1% lidocaine  with 1:100,000 epinephrine was injected onto the nasal septum bilaterally. A hemitransfixion incision was made on the left side. The mucosal flap was carefully elevated on the left side. A cartilaginous incision was made 1 cm superior to the caudal margin of the nasal septum. Mucosal flap was also elevated on the right side in the similar fashion. It should be noted that due to the severe septal deviation, the deviated portion of the cartilaginous and bony septum had to be removed in piecemeal fashion. Once the deviated portions were removed, a straight midline septum was achieved. The septum was then quilted with 4-0 plain gut sutures. The hemitransfixion incision was closed with interrupted 4-0 chromic sutures.   The inferior one half of both hypertrophied inferior turbinate was crossclamped with a Kelly clamp. The inferior one half of each inferior turbinate was then resected with a pair of cross cutting scissors. Hemostasis was achieved with a suction cautery device. Doyle splints were applied to the nasal septum.  Attention was then focused on the left neck mass.  The patient was noted to have a 3.5 cm subcutaneous mass at the left level 2 neck.  Facial nerve monitoring electrodes were placed.  The facial nerve monitoring system was functional.  1% lidocaine with 1-100,000 epinephrine was infiltrated at the planned site of incision.  An elliptical incision was made around neck mass.  The incision was carried down to the level of the subcutaneous fat.  The entire mass was then excised from the surrounding soft tissue.  The specimen was sent to the pathology department for permanent histologic identification.  The surgical site was copiously irrigated.  The incision was closed in layers with 4-0 Vicryl and Dermabond.  The care of the patient was turned over to the anesthesiologist.  The patient was awakened from anesthesia without difficulty. The patient was extubated and transferred to the recovery room in  good condition.   OPERATIVE FINDINGS: Severe nasal septal deviation and bilateral inferior turbinate hypertrophy.  A 3.5 cm left neck subcutaneous mass.  SPECIMEN: Left neck mass.   FOLLOWUP CARE: The patient be discharged home once she is awake and alert. The patient will be placed on Tylenol p.r.n. pain, and amoxicillin 875 mg p.o. b.i.d. for 3 days. The patient will follow up in my office in 2 days for splint removal.   Jerime Arif Raynelle Bring, MD

## 2021-09-12 NOTE — Transfer of Care (Signed)
Immediate Anesthesia Transfer of Care Note  Patient: Brenda Franklin  Procedure(s) Performed: NASAL SEPTOPLASTY WITH TURBINATE REDUCTION (Bilateral: Nose) EXCISION MASS LEFT NECK MASS (Left: Neck)  Patient Location: PACU  Anesthesia Type:General  Level of Consciousness: awake and drowsy  Airway & Oxygen Therapy: Patient Spontanous Breathing  Post-op Assessment: Report given to RN and Post -op Vital signs reviewed and stable  Post vital signs: Reviewed and stable  Last Vitals:  Vitals Value Taken Time  BP 138/73 09/12/21 1021  Temp    Pulse 71 09/12/21 1022  Resp 9 09/12/21 1022  SpO2 95 % 09/12/21 1022  Vitals shown include unvalidated device data.  Last Pain:  Vitals:   09/12/21 0810  TempSrc:   PainSc: 0-No pain         Complications: No notable events documented.

## 2021-09-12 NOTE — H&P (Signed)
Cc: Chronic nasal obstruction, left neck mass  HPI: The patient is a 75 year old female who returns today for her follow-up evaluation. The patient was seen previously for chronic nasal obstruction and left neck mass.  At her last visit, she was noted to have a severe left septal deviation, bilateral inferior turbinate hypertrophy, and diffuse nasal mucosal congestion.  The patient was treated with Flonase nasal spray and allergy medications.  In addition, she also has a left neck mass, with the appearance suggestive of a sebaceous cyst.  The patient returns today reporting persistent nasal obstruction.  She is interested in proceeding with surgical intervention and excision of her left neck mass. No other ENT, GI, or respiratory issue noted since the last visit.   Exam: General: Communicates without difficulty, well nourished, no acute distress. Head: Normocephalic, no evidence injury, no tenderness, facial buttresses intact without stepoff. Eyes: PERRL, EOMI. No scleral icterus, conjunctivae clear. Neuro: CN II exam reveals vision grossly intact.  No nystagmus at any point of gaze. Ears: Auricles well formed without lesions.  Ear canals are intact without mass or lesion.  No erythema or edema is appreciated.  The TMs are intact without fluid. Nose: External evaluation reveals normal support and skin without lesions.  Dorsum is intact.  Anterior rhinoscopy reveals congested and edematous mucosa over anterior aspect of the inferior turbinates and nasal septum.  No purulence is noted. Middle meatus is not well visualized. Oral:  Oral cavity and oropharynx are intact, symmetric, without erythema or edema.  Mucosa is moist without lesions. Neck: Full range of motion without pain.  There is no significant lymphadenopathy.  A subcutaneous 3 cm left neck mass.  Thyroid bed within normal limits to palpation.  Parotid glands and submandibular glands equal bilaterally without mass.  Trachea is midline. Neuro:  CN 2-12  grossly intact. Gait normal. Vestibular: No nystagmus at any point of gaze. A flexible scope was inserted into the right nasal cavity. Endoscopy of the interior nasal cavity, superior, inferior, and middle meatus was performed. The sphenoid-ethmoid recess was examined. Edematous mucosa was noted. No polyp, mass, or lesion was appreciated. Olfactory cleft was clear. Nasopharynx was clear. Turbinates were hypertrophied but without mass. The procedure was repeated on the contralateral side with severe NSD and spur impinging on the lateral wall.   Assessment  1.  Chronic nasal obstruction, secondary to nasal mucosal congestion, nasal septal deviation, and bilateral inferior turbinate hypertrophy.  She has a severe left septal deviation with a large septal spur.   2.  The patients nasal obstruction has not responded to medical treatment.  3.  The patient also has a 3 cm left Level II neck mass.  The appearance is suggestive of sebaceous cyst.   Plan  1.  The physical exam and nasal endoscopy findings are reviewed with the patient.  2.  Based on the above findings, the patient will benefit from undergoing surgical intervention with septoplasty, bilateral turbinate reduction, and excision of the left neck mass.  The risks, benefits, alternatives and details of the procedures are reviewed with the patient.  Questions are invited and answered.  3.  The patient would like to proceed with the procedures.

## 2021-09-12 NOTE — Anesthesia Postprocedure Evaluation (Signed)
Anesthesia Post Note  Patient: Brenda Franklin  Procedure(s) Performed: NASAL SEPTOPLASTY WITH TURBINATE REDUCTION (Bilateral: Nose) EXCISION MASS LEFT NECK MASS (Left: Neck)     Patient location during evaluation: PACU Anesthesia Type: General Level of consciousness: awake and alert Pain management: pain level controlled Vital Signs Assessment: post-procedure vital signs reviewed and stable Respiratory status: spontaneous breathing, nonlabored ventilation, respiratory function stable and patient connected to nasal cannula oxygen Cardiovascular status: blood pressure returned to baseline and stable Postop Assessment: no apparent nausea or vomiting Anesthetic complications: no   No notable events documented.  Last Vitals:  Vitals:   09/12/21 1035 09/12/21 1050  BP: (!) 128/98 (!) 153/74  Pulse: 69 64  Resp: 16 13  Temp:  36.7 C  SpO2: 95% 97%    Last Pain:  Vitals:   09/12/21 1050  TempSrc:   PainSc: 0-No pain                 Eliette Drumwright L Savi Lastinger

## 2021-09-13 ENCOUNTER — Encounter (HOSPITAL_COMMUNITY): Payer: Self-pay | Admitting: Otolaryngology

## 2021-09-13 LAB — SURGICAL PATHOLOGY

## 2021-09-16 IMAGING — CT CT CTA ABD/PEL W/CM AND/OR W/O CM
1 of 3 series · 12 of 32 positions shown, 16 images · IV contrast (APPLIED)
Comparison: CT scan of the chest 08/09/2020

CLINICAL DATA: 74-year-old female with aortic abnormality detected
on noncontrast enhanced CT scan

EXAM:
CTA ABDOMEN AND PELVIS WITHOUT AND WITH CONTRAST
TECHNIQUE: Multidetector CT imaging of the abdomen and pelvis was performed
using the standard protocol during bolus administration of
intravenous contrast. Multiplanar reconstructed images and MIPs were
obtained and reviewed to evaluate the vascular anatomy.
CONTRAST:  75mL JVIA9C-0LG IOPAMIDOL (JVIA9C-0LG) INJECTION 76%

[Series 6: chest angio_dissection · axial · 0.71mm/px · z∈[-344,+4]mm · 12 of 204 slices shown, 16 images]
[im 20/204  soft-tissue]
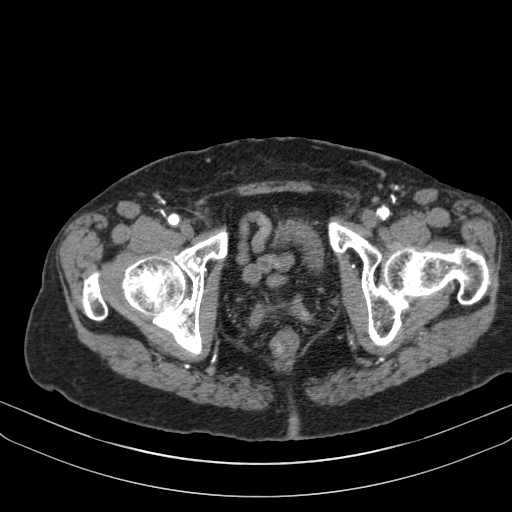
[im 20/204  bone]
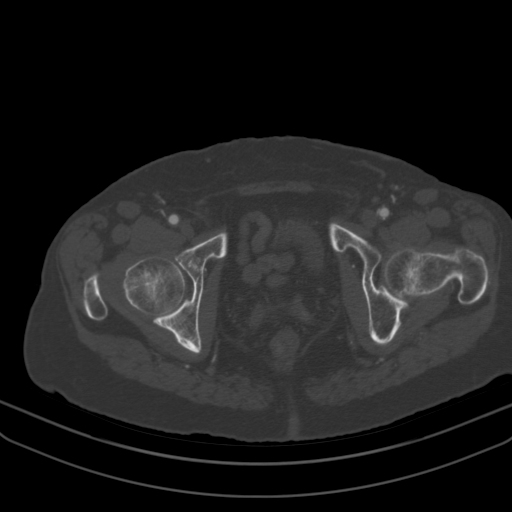
[im 39/204  soft-tissue]
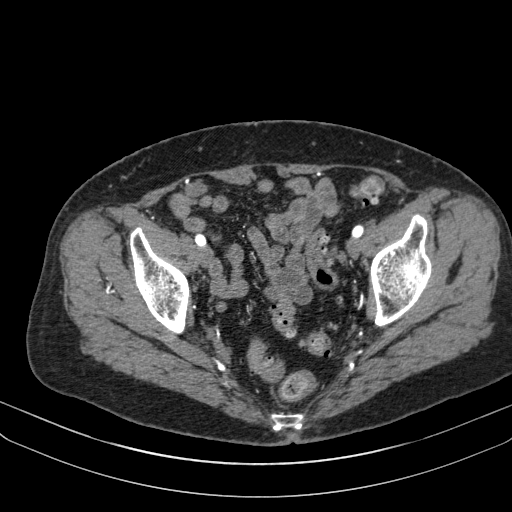
[im 59/204  soft-tissue]
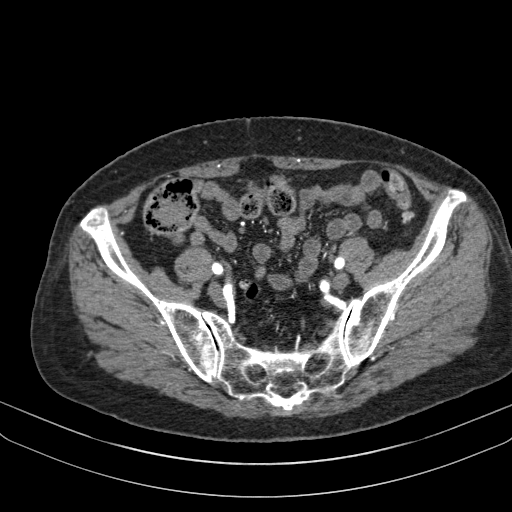
[im 78/204  soft-tissue]
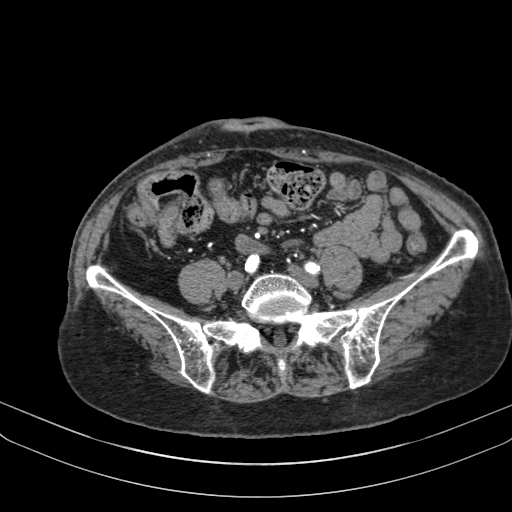
[im 97/204  soft-tissue]
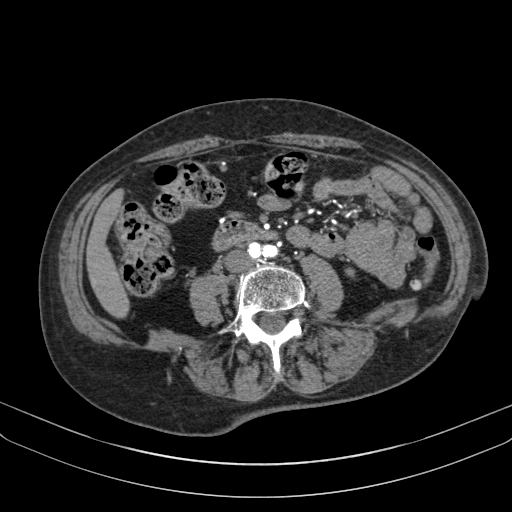
[im 117/204  soft-tissue]
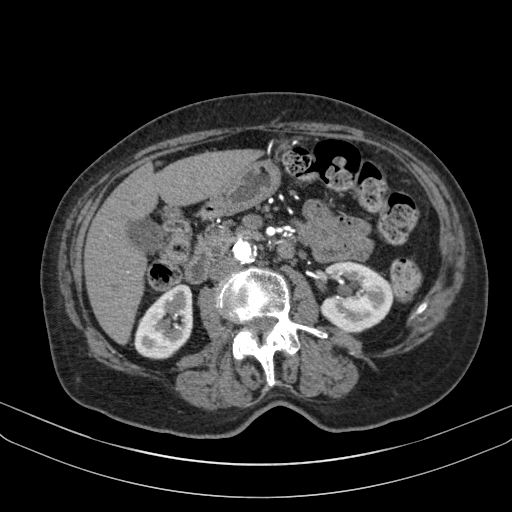
[im 136/204  soft-tissue]
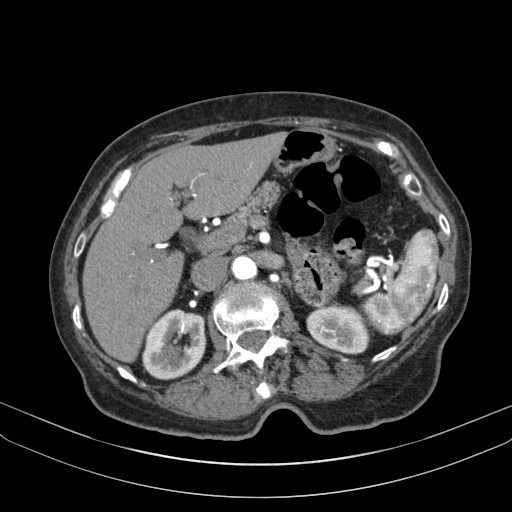
[im 155/204  soft-tissue]
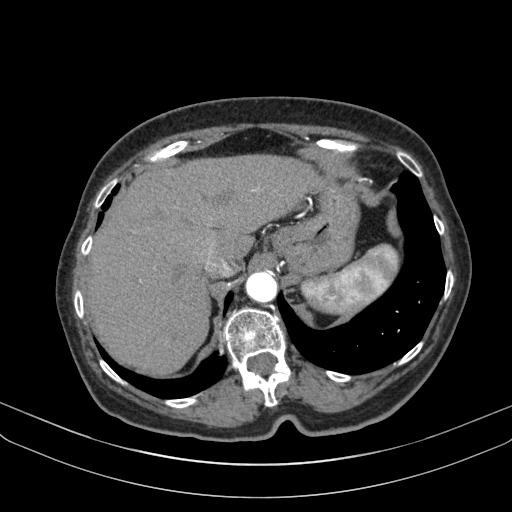
[im 165/204  lung]
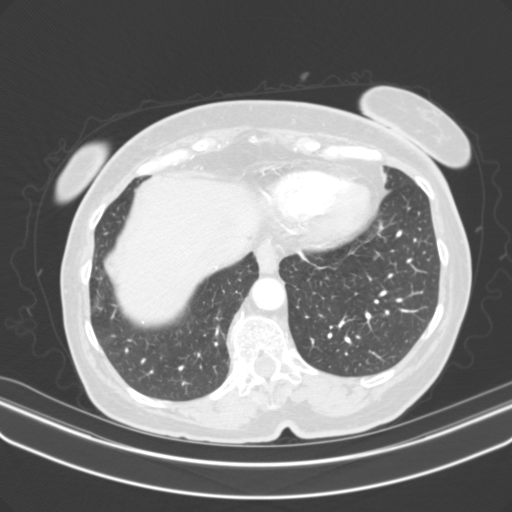
[im 175/204  soft-tissue]
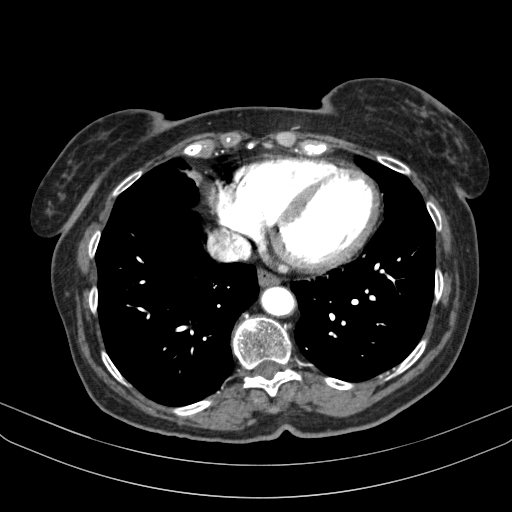
[im 175/204  lung]
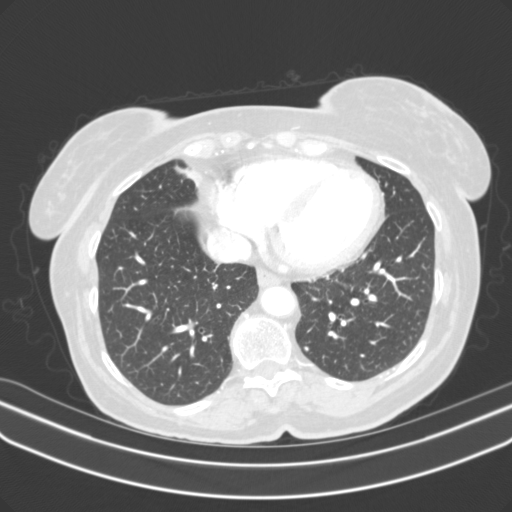
[im 175/204  bone]
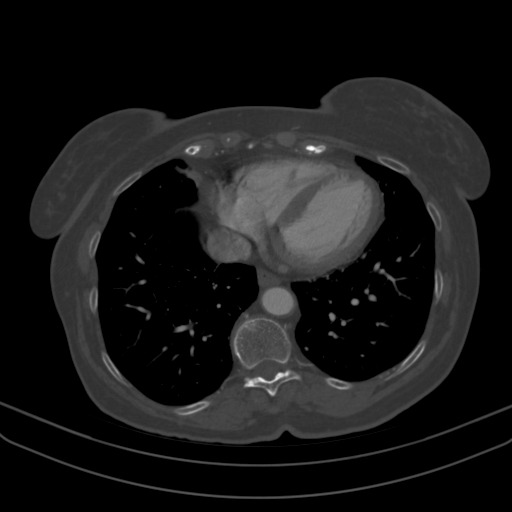
[im 184/204  lung]
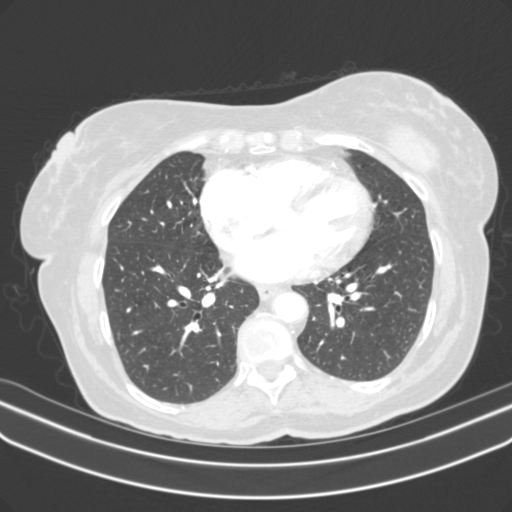
[im 194/204  soft-tissue]
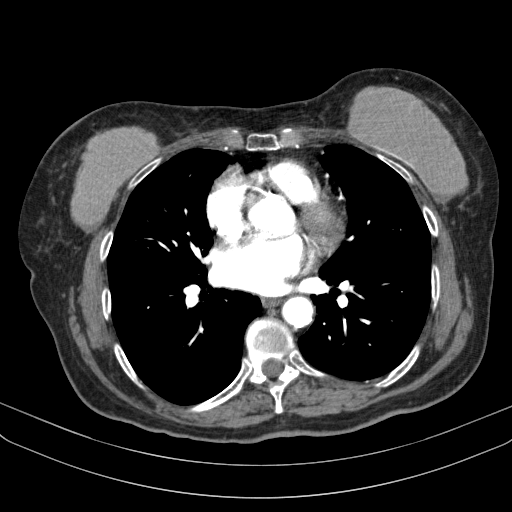
[im 194/204  lung]
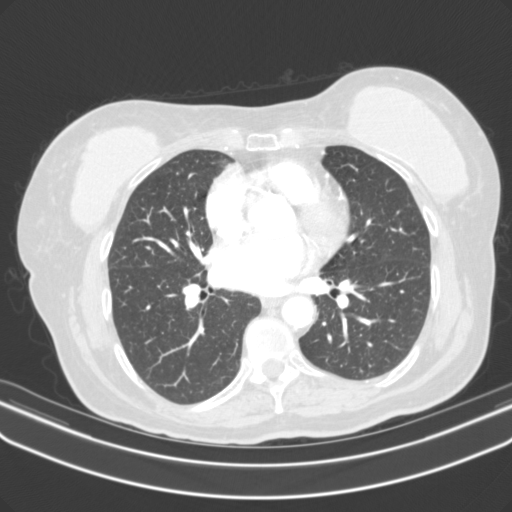

[12 of 32 positions shown; findings below may reference images not displayed]

FINDINGS: VASCULAR

Aorta: Heterogeneous atherosclerotic plaque throughout the abdominal
aorta. There is a focal penetrating atherosclerotic ulcer arising in
the infrarenal segment resulting in focal ectasia. The maximal
aortic diameter at this location is 2.4 cm. The aortic diameter
proximal to the level of disease is 16.4 cm.

Celiac: Patent without evidence of aneurysm, dissection, vasculitis
or significant stenosis.

SMA: Patent without evidence of aneurysm, dissection, vasculitis or
significant stenosis.

Renals: Both renal arteries are patent without evidence of aneurysm,
dissection, vasculitis, fibromuscular dysplasia or significant
stenosis.

IMA: Patent without evidence of aneurysm, dissection, vasculitis or
significant stenosis.

Inflow: Patent without evidence of aneurysm, dissection, vasculitis
or significant stenosis.

Proximal Outflow: Bilateral common femoral and visualized portions
of the superficial and profunda femoral arteries are patent without
evidence of aneurysm, dissection, vasculitis or significant
stenosis.

Veins: No focal venous abnormality.

Review of the MIP images confirms the above findings.

NON-VASCULAR

Lower chest: Calcified granuloma in the right lower lobe. The lungs
are otherwise clear. Visualized cardiac structures normal in size.
No pericardial effusion. Incompletely image calcification of the
mitral valve annulus. Coronary artery calcifications. The distal
thoracic esophagus is unremarkable.

Hepatobiliary: Small punctate calcifications in the superior right
and left lobes of the liver consistent with old granulomatous
disease. No additional focal lesions. Gallbladder is unremarkable.
No intra or extrahepatic biliary ductal dilatation.

Pancreas: Unremarkable. No pancreatic ductal dilatation or
surrounding inflammatory changes.

Spleen: Tiny punctate calcifications scattered throughout the spleen
consistent with old granulomatous disease. The spleen is normal in
size.

Adrenals/Urinary Tract: Normal adrenal glands. No hydronephrosis or
nephrolithiasis. 2.2 cm water attenuation cyst in the interpolar
left kidney consistent with a simple cyst.

Stomach/Bowel: Colonic diverticular disease without CT evidence of
active inflammation. No focal bowel wall thickening or evidence of
obstruction.

Lymphatic: No suspicious lymphadenopathy.

Reproductive: 3.3 x 3.3 cm simple appearing cystic lesion within the
left ovary. The uterus is surgically absent. No right adnexal mass.

Other: No abdominal wall hernia or abnormality. No abdominopelvic
ascites.

Musculoskeletal: No acute osseous abnormality. Chronic appearing L4
compression fracture with approximately 40% height loss anteriorly.
Chronic appearing compression deformity of the superior endplate of
T12.
IMPRESSION: VASCULAR

1. Penetrating atherosclerotic ulceration arising from the left
lateral wall of the infrarenal abdominal aorta results in focal
aneurysmal dilation of the aorta with a maximal diameter of 2.4 cm
(1.5 times the diameter of the adjacent normal aorta which is
measured at 1.6 cm). Due to the suspected etiology of this
abnormality, traditional follow-up recommendations for abdominal
aortic aneurysm may not be adequate. Consider initiation of statin
therapy to stabilize the underlying plaque as well as initial
follow-up CT arteriogram in 6 months. Aortic Atherosclerosis
(HTEBB-ZDW.W); Aortic aneurysm NOS (HTEBB-WLB.A).
2. Coronary artery calcifications.

NON-VASCULAR

1. Simple appearing 3.4 cm left ovarian cystic lesion, likely
benign. Recommend follow-up US in 6-12 months. Note: This
recommendation does not apply to premenarchal patients and to those
with increased risk (genetic, family history, elevated tumor markers
or other high-risk factors) of ovarian cancer. Reference: JACR [DATE]):248-254
2. Sequelae of old granulomatous disease in the lung, liver and
spleen.
3. Colonic diverticular disease without CT evidence of active
inflammation.
4. Chronic T12 and L4 compression fractures.

## 2021-10-08 DIAGNOSIS — H2511 Age-related nuclear cataract, right eye: Secondary | ICD-10-CM | POA: Diagnosis not present

## 2021-10-23 DIAGNOSIS — H5703 Miosis: Secondary | ICD-10-CM | POA: Diagnosis not present

## 2021-10-23 DIAGNOSIS — H2511 Age-related nuclear cataract, right eye: Secondary | ICD-10-CM | POA: Diagnosis not present

## 2021-10-24 ENCOUNTER — Encounter (INDEPENDENT_AMBULATORY_CARE_PROVIDER_SITE_OTHER): Payer: Medicare Other | Admitting: Ophthalmology

## 2021-10-30 ENCOUNTER — Telehealth: Payer: Self-pay | Admitting: Internal Medicine

## 2021-10-30 MED ORDER — SYMBICORT 80-4.5 MCG/ACT IN AERO: 2.0000 | INHALATION_SPRAY | Freq: Two times a day (BID) | RESPIRATORY_TRACT | 2 refills | Status: DC

## 2021-10-30 NOTE — Telephone Encounter (Signed)
Spoke with [name] who is requesting 2 90 day refills on Symbicort sent to [facility]. Refill sent

## 2021-10-30 NOTE — Telephone Encounter (Signed)
Lm for patient's daughter, [name](DPR)

## 2021-10-30 NOTE — Telephone Encounter (Signed)
Brenda Franklin daughter is returning phone call. Brenda Franklin phone number is [phone].

## 2021-11-05 ENCOUNTER — Encounter: Payer: Self-pay | Admitting: Cardiology

## 2021-11-05 ENCOUNTER — Ambulatory Visit: Payer: PPO | Admitting: Cardiology

## 2021-11-05 ENCOUNTER — Other Ambulatory Visit: Payer: Self-pay

## 2021-11-05 ENCOUNTER — Ambulatory Visit: Payer: Medicare Other | Admitting: Cardiology

## 2021-11-05 VITALS — BP 129/60 | HR 65 | Temp 97.8°F | Resp 16 | Ht 64.0 in | Wt 140.0 lb

## 2021-11-05 DIAGNOSIS — I2510 Atherosclerotic heart disease of native coronary artery without angina pectoris: Secondary | ICD-10-CM | POA: Diagnosis not present

## 2021-11-05 DIAGNOSIS — E782 Mixed hyperlipidemia: Secondary | ICD-10-CM | POA: Diagnosis not present

## 2021-11-05 DIAGNOSIS — F17210 Nicotine dependence, cigarettes, uncomplicated: Secondary | ICD-10-CM | POA: Diagnosis not present

## 2021-11-05 MED ORDER — METOPROLOL TARTRATE 25 MG PO TABS: 25.0000 mg | ORAL_TABLET | Freq: Two times a day (BID) | ORAL | 3 refills | Status: DC

## 2021-11-05 MED ORDER — EZETIMIBE 10 MG PO TABS: 10.0000 mg | ORAL_TABLET | Freq: Every day | ORAL | 3 refills | Status: DC

## 2021-11-05 MED ORDER — NICOTINE 7 MG/24HR TD PT24: 7.0000 mg | MEDICATED_PATCH | Freq: Every day | TRANSDERMAL | 3 refills | Status: DC

## 2021-11-05 MED ORDER — ATORVASTATIN CALCIUM 40 MG PO TABS: ORAL_TABLET | ORAL | 3 refills | Status: DC

## 2021-11-05 NOTE — Progress Notes (Signed)
Patient referred by [name], MD for back pain, possible angina equivalent  Subjective:   Brenda Franklin, female    DOB: 02-20-1946, 76 y.o.   MRN: [id number]   Chief Complaint  Patient presents with   Coronary Artery Disease   Follow-up    6 month    HPI  76 y.o. Caucasian female with hypertension, hyperlipidemia, tobacco dependence, coronary artery disease, penetrating ulcer of infrarenal abdominal aorta  Patient is here with her daughter.  She is doing well without any complaints of chest painshortness of breath.  Her chronic back pain persists without any worsening.  Patient is not taking aspirin due to GI intolerance.  She is compliant with her statin.  Blood pressure is well controlled.  Unfortunately, she continues to smoke up to half pack a day.   Current Outpatient Medications on File Prior to Visit  Medication Sig Dispense Refill   albuterol (VENTOLIN HFA) 108 (90 Base) MCG/ACT inhaler Inhale 1 puff into the lungs every 4 (four) hours as needed for shortness of breath or wheezing.     atorvastatin (LIPITOR) 40 MG tablet TAKE 1 TABLET(40 MG) BY MOUTH DAILY 90 tablet 3   b complex vitamins capsule Take 1 capsule by mouth daily.     bismuth subsalicylate (PEPTO BISMOL) 262 MG/15ML suspension Take 30 mLs by mouth every 6 (six) hours as needed for indigestion.     Carboxymethylcellul-Glycerin (LUBRICATING EYE DROPS OP) Place 1 drop into both eyes daily as needed (dry eyes).     Cholecalciferol (D-3-5) 125 MCG (5000 UT) capsule Take 5,000 Units by mouth daily.     citalopram (CELEXA) 10 MG tablet Take 10 mg by mouth daily.     ezetimibe (ZETIA) 10 MG tablet TAKE 1 TABLET(10 MG) BY MOUTH DAILY 90 tablet 0   lidocaine (LIDODERM) 5 % Place 1 patch onto the skin daily as needed (pain). Remove & Discard patch within 12 hours or as directed by MD     metoprolol tartrate (LOPRESSOR) 25 MG tablet TAKE 1 TABLET(25 MG) BY MOUTH TWICE DAILY 120 tablet 3   Multiple Vitamin (MULTIVITAMIN)  capsule Take 1 capsule by mouth daily.     SYMBICORT 80-4.5 MCG/ACT inhaler Inhale 2 puffs into the lungs 2 (two) times daily. 30.6 g 2   traZODone (DESYREL) 50 MG tablet Take 50 mg by mouth at bedtime as needed.     Zinc Sulfate (ZINC 15 PO) Take 15 mg by mouth daily.     No current facility-administered medications on file prior to visit.    Cardiovascular and other pertinent studies:  EKG 11/05/2021: Sinus rhythm 62 bpm Normal EKG  CTA abdomen 09/2020: 1. Penetrating atherosclerotic ulceration arising from the left lateral wall of the infrarenal abdominal aorta results in focal aneurysmal dilation of the aorta with a maximal diameter of 2.4 cm (1.5 times the diameter of the adjacent normal aorta which is measured at 1.6 cm). Due to the suspected etiology of this abnormality, traditional follow-up recommendations for abdominal aortic aneurysm may not be adequate. Consider initiation of statin therapy to stabilize the underlying plaque as well as initial follow-up CT arteriogram in 6 months. Aortic Atherosclerosis (ICD10-I70.0); Aortic aneurysm NOS (ICD10-I71.9). 2. Coronary artery calcifications.   NON-VASCULAR   1. Simple appearing 3.4 cm left ovarian cystic lesion, likely benign. Recommend follow-up US in 6-12 months. Note: This recommendation does not apply to premenarchal patients and to those with increased risk (genetic, family history, elevated tumor markers or other high-risk factors)  of ovarian cancer. Reference: JACR 2020 Feb; 17(2):248-254 2. 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 3. Colonic diverticular disease without CT evidence of active inflammation. 4. Chronic T12 and L4 compression fractures.  Lexiscan/modified Bruce Tetrofosmin stress test 06/21/2020: Lexiscan/modified Bruce nuclear stress test performed using 1-day protocol. Stress EKG is non-diagnostic, as this is pharmacological stress test. In addition, stress EKG at 82%  MPHR showed no ischemic changes.  Small sized, mild intensity, reversible perfusion defect in basal inferoseptal myocardium.  Low risk study.  Stress LVEF 69%.  Echocardiogram 06/20/2020:  Left ventricle cavity is normal in size and wall thickness. Normal global  wall motion. Normal LV systolic function with EF 55%. Doppler evidence of  grade I (impaired) diastolic dysfunction, normal LAP. Calculated EF 55%.  Trileaflet aortic valve.  Trace aortic regurgitation.  Mild mitral annular calcification. Trace mitral stenosis. Moderate (Grade  II) mitral regurgitation.  Inadequate TR jet to estimate pulmonary artery systolic pressure. Normal  right atrial pressure.   ABI 06/19/2020:  This exam reveals mildly decreased perfusion of the right lower extremity,  noted at the anterior tibial and post tibial artery level (ABI 0.88) and  mildly decreased perfusion of the left lower extremity, noted at the post  tibial artery level (ABI 0.88).  Mildly abnormal biphasic waveforms at the  bilateral AT.   CT Cardiac scoring 06/01/2020: Total score: 867 LM: 94 LAD: 501 LCx: 0 RCA: 272  EKG 05/25/2020: Sinus rhythm 62 bpm Normal EKG  Recent labs: 05/01/2021: Glucose 78, BUN/Cr 23/0.91. EGFR 65. Na/K 141/4.8.   11/2020: Chol 194, TG 158, HDL 68, LDL 99  06/22/2020: Chol 196, TG 111, HDL 62, LDL 114  05/15/2020: EGFR 73 Chol N/A, TG 160, HDL 68, LDL 207 TSH 2.9 normal   Review of Systems  Cardiovascular:  Negative for chest pain, dyspnea on exertion, leg swelling, palpitations and syncope.  Musculoskeletal:  Positive for back pain.        Vitals:   11/05/21 1206  BP: 129/60  Pulse: 65  Resp: 16  Temp: 97.8 F (36.6 C)  SpO2: 96%     Body mass index is 24.03 kg/m. Filed Weights   11/05/21 1206  Weight: 140 lb (63.5 kg)     Objective:   Physical Exam Vitals and nursing note reviewed.  Constitutional:      General: She is not in acute distress. Neck:     Vascular: No  JVD.  Cardiovascular:     Rate and Rhythm: Normal rate and regular rhythm.     Pulses:          Dorsalis pedis pulses are 0 on the right side and 0 on the left side.       Posterior tibial pulses are 1+ on the right side and 1+ on the left side.     Heart sounds: Normal heart sounds. No murmur heard. Pulmonary:     Effort: Pulmonary effort is normal.     Breath sounds: Normal breath sounds. No wheezing or rales.  Musculoskeletal:     Right lower leg: No edema.     Left lower leg: No edema.        Assessment & Recommendations:   76 y.o. Caucasian female with hypertension, hyperlipidemia, tobacco dependence, coronary artery disease, penetrating ulcer of infrarenal abdominal aorta   Penetrating ulcer of aorta: Ordered repeat CTA pending.  Continue follow-up with vascular surgery.  CAD: Elevated calcium score and mildly abnormal stress test.  No overt angina/angina  equivalent symptoms.  I suspect her back pain symptoms are most likely musculoskeletal in etiology, specifically chronic L4 compression fracture. Does not tolerate Aspirin due to GI intolerance. Continue metoprolol tartarate 25 mg bid, lipitor 40 mg, Zetia 10 mg daily. Check lipid panel.  Mixed hyperlipidemia: Continue Lipitor 40, Zetia 10 mg daily. Check lipid panel  Penetrating ulcer of infrarenal abdominal aorta: F/u w/vascular surgery Continue hypertension, hyperlipidemia management, smoking cessation.  Abnormal peripheral pulse: Mildly abnormal ABI. Recommend risk factor modification, tobacco cessation, regular walking.   Nicotine dependence: Tobacco cessation counseling:  - Currently smoking 1/2-3/4 packs/day   - Patient was informed of the dangers of tobacco abuse including stroke, cancer, and MI, as well as benefits of tobacco cessation. - Patient is willing to quit at this time. - Approximately 5 mins were spent counseling patient cessation techniques. We discussed various methods to help quit  smoking, including deciding on a date to quit, joining a support group, pharmacological agents. Patient would like to use nicotine patch. - I will reassess her progress at the next follow-up visit   F/u in 6 months   Nigel Mormon, MD Pager: [phone] Office: [phone]

## 2021-11-06 ENCOUNTER — Encounter: Payer: Self-pay | Admitting: Cardiology

## 2021-11-08 ENCOUNTER — Ambulatory Visit: Payer: PPO | Admitting: Cardiology

## 2021-12-03 DIAGNOSIS — N83202 Unspecified ovarian cyst, left side: Secondary | ICD-10-CM | POA: Diagnosis not present

## 2021-12-03 DIAGNOSIS — N83209 Unspecified ovarian cyst, unspecified side: Secondary | ICD-10-CM | POA: Diagnosis not present

## 2021-12-26 NOTE — Progress Notes (Signed)
?[facility] Note ? ?12/27/2021 ? ?  ? ?CHIEF COMPLAINT ?Patient presents for Retina Follow Up ? ? ?HISTORY OF PRESENT ILLNESS: ?Brenda Franklin is a 76 y.o. female who presents to the clinic today for:  ? ?HPI   ? ? Retina Follow Up   ?Patient presents with  Dry AMD.  In both eyes.  This started 11 months ago.  I, the attending physician,  performed the HPI with the patient and updated documentation appropriately. ? ?  ?  ? ? Comments   ?Patient here for 11 months retina follow up for non exu ARMD OU. Patient states vision been ok. Had cataract surgery OD- healing from surgery. Something had come up and continuing drops. Had some swelling. Using pred forte and a red top drop. Doesn't know the name of it. Getting new hearing aids.  ? ?  ?  ?Last edited by Bernarda Caffey, MD on 12/31/2021  1:52 AM.  ?  ?pt state no change in vision, she has had cataract sx OD, she is using PF and Ketorolac QID OD bc Dr. Eulas Post saw swelling in the back of her eye about a month after sx ? ?Referring physician: ?Lonia Skinner, MD ?[address] ?[place],  [place] [address] ? ?HISTORICAL INFORMATION:  ? ?Selected notes from the [facility] ? ?  ? ?CURRENT MEDICATIONS: ?Current Outpatient Medications (Ophthalmic Drugs)  ?Medication Sig  ? Carboxymethylcellul-Glycerin (LUBRICATING EYE DROPS OP) Place 1 drop into both eyes daily as needed (dry eyes).  ? ?No current facility-administered medications for this visit. (Ophthalmic Drugs)  ? ?Current Outpatient Medications (Other)  ?Medication Sig  ? albuterol (VENTOLIN HFA) 108 (90 Base) MCG/ACT inhaler Inhale 1 puff into the lungs every 4 (four) hours as needed for shortness of breath or wheezing.  ? atorvastatin (LIPITOR) 40 MG tablet TAKE 1 TABLET(40 MG) BY MOUTH DAILY  ? b complex vitamins capsule Take 1 capsule by mouth daily.  ? bismuth subsalicylate (PEPTO BISMOL) 262 MG/15ML suspension Take 30 mLs by mouth every 6 (six) hours as needed for  indigestion.  ? Cholecalciferol (D-3-5) 125 MCG (5000 UT) capsule Take 5,000 Units by mouth daily.  ? citalopram (CELEXA) 10 MG tablet Take 10 mg by mouth daily.  ? ezetimibe (ZETIA) 10 MG tablet Take 1 tablet (10 mg total) by mouth daily.  ? lidocaine (LIDODERM) 5 % Place 1 patch onto the skin daily as needed (pain). Remove & Discard patch within 12 hours or as directed by MD  ? metoprolol tartrate (LOPRESSOR) 25 MG tablet Take 1 tablet (25 mg total) by mouth 2 (two) times daily.  ? Multiple Vitamin (MULTIVITAMIN) capsule Take 1 capsule by mouth daily.  ? nicotine (NICODERM CQ - DOSED IN MG/24 HR) 7 mg/24hr patch Place 1 patch (7 mg total) onto the skin daily.  ? SYMBICORT 80-4.5 MCG/ACT inhaler Inhale 2 puffs into the lungs 2 (two) times daily.  ? traZODone (DESYREL) 50 MG tablet Take 50 mg by mouth at bedtime as needed.  ? Zinc Sulfate (ZINC 15 PO) Take 15 mg by mouth daily.  ? ?No current facility-administered medications for this visit. (Other)  ? ?REVIEW OF SYSTEMS: ?ROS   ?Positive for: Neurological, Skin, Eyes ?Negative for: Constitutional, Gastrointestinal, Genitourinary, Musculoskeletal, HENT, Endocrine, Cardiovascular, Respiratory, Psychiatric, Allergic/Imm, Heme/Lymph ?Last edited by Theodore Demark, COA on 12/27/2021  1:48 PM.  ?  ? ?ALLERGIES ?Allergies  ?Allergen Reactions  ? Tolmetin Nausea And Vomiting  ?  Codeine Nausea And Vomiting and Other (See Comments)  ?  Upset stomach  ? Lovastatin   ?  Other reaction(s): myalgia  ? Milk-Related Compounds Diarrhea  ? Mirtazapine   ?  Other reaction(s): loose stool, too sedating  ? Nsaids Diarrhea and Other (See Comments)  ?  Stomach upset  ? Vilazodone Hcl   ?  Other reaction(s): hallucination w '20mg'$  dose  ? Oxycodone Nausea And Vomiting  ? ?PAST MEDICAL HISTORY ?Past Medical History:  ?Diagnosis Date  ? Asthma   ? BCC (basal cell carcinoma) 12/09/2012  ? right inner eye (Cx35FU)  ? Cataracts, bilateral   ? Compression fracture of fifth lumbar vertebra ([place])    ? Depression   ? Dysrhythmia   ? Extremity pain   ? High cholesterol   ? ?Past Surgical History:  ?Procedure Laterality Date  ? ABDOMINAL HYSTERECTOMY    ? AUGMENTATION MAMMAPLASTY Bilateral   ? BREAST ENHANCEMENT SURGERY    ? COLONOSCOPY    ? ESOPHAGEAL DILATION    ? EXCISION VAGINAL CYST    ? HEMORRHOID SURGERY    ? MASS EXCISION Left 09/12/2021  ? Procedure: EXCISION MASS LEFT NECK MASS;  Surgeon: Leta Baptist, MD;  Location: [facility];  Service: ENT;  Laterality: Left;  ? metal stimulator for bowel control    ? Removed  ? NASAL SEPTOPLASTY W/ TURBINOPLASTY Bilateral 09/12/2021  ? Procedure: NASAL SEPTOPLASTY WITH TURBINATE REDUCTION;  Surgeon: Leta Baptist, MD;  Location: [facility];  Service: ENT;  Laterality: Bilateral;  ? NOSE SURGERY    ? ?FAMILY HISTORY ?Family History  ?Problem Relation Age of Onset  ? Other Mother   ?     Intestinal problem - gangrene  ? Cancer Father   ?     Bile Duct Cancer  ? Heart disease Father   ? Heart attack Father   ? Hypercholesterolemia Father   ? Kidney cancer Sister   ? Kidney cancer Brother   ? ?SOCIAL HISTORY ?Social History  ? ?Tobacco Use  ? Smoking status: Every Day  ?  Packs/day: 1.00  ?  Types: Cigarettes  ? Smokeless tobacco: Never  ?Vaping Use  ? Vaping Use: Never used  ?Substance Use Topics  ? Alcohol use: No  ? Drug use: No  ?  ? ?  ?OPHTHALMIC EXAM: ? ?Base Eye Exam   ? ? Visual Acuity (Snellen - Linear)   ? ?   Right Left  ? Dist Athena 20/25 20/20  ? Dist ph [place] NI   ? ?  ?  ? ? Tonometry (Tonopen, 1:45 PM)   ? ?   Right Left  ? Pressure 12 11  ? ?  ?  ? ? Pupils   ? ?   Dark Light Shape React APD  ? Right 2 1 Round Brisk None  ? Left 2 1 Round Brisk None  ? ?  ?  ? ? Visual Fields (Counting fingers)   ? ?   Left Right  ?  Full Full  ? ?  ?  ? ? Extraocular Movement   ? ?   Right Left  ?  Full, Ortho Full, Ortho  ? ?  ?  ? ? Neuro/Psych   ? ? Oriented x3: Yes  ? Mood/Affect: Normal  ? ?  ?  ? ? Dilation   ? ? Both eyes: 1.0% Mydriacyl, 2.5% Phenylephrine @ 1:45 PM  ? ?  ?  ? ?   ? ?Slit Lamp and Fundus  Exam   ? ? Slit Lamp Exam   ? ?   Right Left  ? Lids/Lashes Dermato Dermato  ? Conjunctiva/Sclera White and quiet White and quiet  ? Cornea Arcus, trace PEE, well healed cataract wound Arcus, trace tear film debris, well healed cataract wounds, trace endo pigment  ? Anterior Chamber Deep and clear Deep and clear, No cell or flare  ? Iris Round and moderately dilated Round and moderately dilated  ? Lens PC IOL in good position PC IOL in good position  ? Anterior Vitreous Synerisis Synerisis  ? ?  ?  ? ? Fundus Exam   ? ?   Right Left  ? Disc Pink, sharp, compact Pink, sharp, compact, Mild temporal PPA  ? C/D Ratio 0.1 0.1  ? Macula flat, Blunted foveal reflex, RPE mottling and clumping, Drusen, No heme or edema Blunted foveal reflex, Drusen, RPE mottling and clumping, No heme or edema.  ? Vessels Attenuated, tortuous, Copper wiring, AV crossing changes Attenuated, tortuous, Copper wiring, AV crossing changes  ? Periphery Attached, Reticular degeneration, No heme Attached, Reticular degeneration, No heme  ? ?  ?  ? ?  ? ?IMAGING AND PROCEDURES  ?Imaging and Procedures for 12/27/2021 ? ?OCT, Retina - OU - Both Eyes   ? ?   ?Right Eye ?Quality was good. Central Foveal Thickness: 260. Progression has been stable. Findings include normal foveal contour, no IRF, no SRF, retinal drusen .  ? ?Left Eye ?Quality was good. Central Foveal Thickness: 255. Progression has been stable. Findings include normal foveal contour, no IRF, no SRF, retinal drusen .  ? ?Notes ?*Images captured and stored on drive ? ?Diagnosis / Impression:  ?Non-exu ARMD OU - stable ? ?Clinical management:  ?See below ? ?Abbreviations: NFP - Normal foveal profile. CME - cystoid macular edema. PED - pigment epithelial detachment. IRF - intraretinal fluid. SRF - subretinal fluid. EZ - ellipsoid zone. ERM - epiretinal membrane. ORA - outer retinal atrophy. ORT - outer retinal tubulation. SRHM - subretinal hyper-reflective material. IRHM  - intraretinal hyper-reflective material ? ? ?  ?  ?  ? ?  ?ASSESSMENT/PLAN: ? ?  ICD-10-CM   ?1. Early dry stage nonexudative age-related macular degeneration of both eyes  H35.3131 OCT, Retina - OU - Both Ey

## 2021-12-27 ENCOUNTER — Ambulatory Visit (INDEPENDENT_AMBULATORY_CARE_PROVIDER_SITE_OTHER): Payer: PPO | Admitting: Ophthalmology

## 2021-12-27 ENCOUNTER — Encounter (INDEPENDENT_AMBULATORY_CARE_PROVIDER_SITE_OTHER): Payer: Self-pay | Admitting: Ophthalmology

## 2021-12-27 DIAGNOSIS — H353131 Nonexudative age-related macular degeneration, bilateral, early dry stage: Secondary | ICD-10-CM | POA: Diagnosis not present

## 2021-12-27 DIAGNOSIS — H35033 Hypertensive retinopathy, bilateral: Secondary | ICD-10-CM | POA: Diagnosis not present

## 2021-12-27 DIAGNOSIS — Z961 Presence of intraocular lens: Secondary | ICD-10-CM | POA: Diagnosis not present

## 2021-12-27 DIAGNOSIS — I10 Essential (primary) hypertension: Secondary | ICD-10-CM

## 2021-12-27 DIAGNOSIS — H04123 Dry eye syndrome of bilateral lacrimal glands: Secondary | ICD-10-CM

## 2021-12-27 DIAGNOSIS — H25811 Combined forms of age-related cataract, right eye: Secondary | ICD-10-CM

## 2021-12-28 ENCOUNTER — Encounter (INDEPENDENT_AMBULATORY_CARE_PROVIDER_SITE_OTHER): Payer: 59 | Admitting: Ophthalmology

## 2021-12-31 ENCOUNTER — Encounter (INDEPENDENT_AMBULATORY_CARE_PROVIDER_SITE_OTHER): Payer: Self-pay | Admitting: Ophthalmology

## 2022-01-01 ENCOUNTER — Telehealth: Payer: Self-pay

## 2022-01-01 NOTE — Telephone Encounter (Signed)
Spoke with patients daughter mindy regarding her moms referral to GYN oncology. She has an appointment scheduled with Dr. Berline Lopes on 01/21/22 at 11:15am. She agrees to date and time. She has been provided with office address and location. She is also aware of our mask and visitor policy. She verbalized understanding and will call with any questions.    ?

## 2022-01-14 ENCOUNTER — Telehealth: Payer: Self-pay

## 2022-01-14 NOTE — Telephone Encounter (Signed)
Spoke with patients daughter [name] and notified her of the need to reschedule the appointment with Dr. Berline Lopes. Appointment moved from 01/21/22 at 11:15am to 01/22/22 at 11:15am. [name] is in agreement of appointment date and time.   ?

## 2022-01-16 DIAGNOSIS — H59031 Cystoid macular edema following cataract surgery, right eye: Secondary | ICD-10-CM | POA: Diagnosis not present

## 2022-01-17 ENCOUNTER — Encounter: Payer: Self-pay | Admitting: Gynecologic Oncology

## 2022-01-21 ENCOUNTER — Ambulatory Visit: Payer: Medicare Other | Admitting: Gynecologic Oncology

## 2022-01-22 ENCOUNTER — Inpatient Hospital Stay: Payer: PPO | Attending: Gynecologic Oncology | Admitting: Gynecologic Oncology

## 2022-01-22 ENCOUNTER — Encounter: Payer: Self-pay | Admitting: Gynecologic Oncology

## 2022-01-22 ENCOUNTER — Other Ambulatory Visit: Payer: Self-pay

## 2022-01-22 VITALS — BP 137/49 | HR 56 | Temp 98.3°F | Resp 16 | Ht 64.0 in | Wt 140.5 lb

## 2022-01-22 DIAGNOSIS — I2510 Atherosclerotic heart disease of native coronary artery without angina pectoris: Secondary | ICD-10-CM | POA: Insufficient documentation

## 2022-01-22 DIAGNOSIS — F17210 Nicotine dependence, cigarettes, uncomplicated: Secondary | ICD-10-CM | POA: Insufficient documentation

## 2022-01-22 DIAGNOSIS — Z7951 Long term (current) use of inhaled steroids: Secondary | ICD-10-CM | POA: Insufficient documentation

## 2022-01-22 DIAGNOSIS — J45909 Unspecified asthma, uncomplicated: Secondary | ICD-10-CM | POA: Diagnosis not present

## 2022-01-22 DIAGNOSIS — Z79899 Other long term (current) drug therapy: Secondary | ICD-10-CM | POA: Insufficient documentation

## 2022-01-22 DIAGNOSIS — E7800 Pure hypercholesterolemia, unspecified: Secondary | ICD-10-CM | POA: Insufficient documentation

## 2022-01-22 DIAGNOSIS — N83202 Unspecified ovarian cyst, left side: Secondary | ICD-10-CM | POA: Diagnosis not present

## 2022-01-22 DIAGNOSIS — Z85828 Personal history of other malignant neoplasm of skin: Secondary | ICD-10-CM | POA: Diagnosis not present

## 2022-01-22 NOTE — Progress Notes (Signed)
GYNECOLOGIC ONCOLOGY NEW PATIENT CONSULTATION  ? ?Patient Name: Brenda Franklin  ?Patient Age: 76 y.o. ?Date of Service: 01/22/22 ?Referring Provider: Ronny Bacon, NP  ? ?Primary Care Provider: Janie Morning, DO ?Consulting Provider: Jeral Pinch, MD  ? ?Assessment/Plan:  ?Postmenopausal patient with simple adnexal mass. ? ?We discussed recent findings on CT scan (reviewed images with the patient). There has been minimal change in size in the cystic lesion over 2 years. Although I can't see recent ultrasounds that she had with her OBGYN, per the report, the mass looks cystic (with comment that there may be evidence of prior bleeding into the cyst).  ? ?Overall, there are not features that raise significant concern for malignancy. While I am somewhat limited given inability to see ultrasound images, we also discussed that there has been minimal increase in size which further supports a benign process. The patient had a CA-125 which was normal. We discussed the limitations of this test as it is not approved for diagnosis of ovarian cancer. It can be elevated in many non cancerous disease processes and can be normal in up to 50% of early stage ovarian cancer. ? ?I recommend that we repeat an ultrasound 3-6 months after her last (and do so at the hospital so that I see the images). If cyst continues to remain simple and similar in size, and the patient remains asymptomatic, I would recommend continued surveillance. If we were to see significant increase in size or change in character of the mass, then we would re-evaluate need for surgery. ? ?With regard to her back pain, given the size of the mass and presence for at least the last two years, I think it is very unlikely to be the cause of her pain. ? ?The patient has not had colon cancer screening in over ten years. I recommend GI evaluation and updating colon cancer screening.   ? ?I will call the patient after her repeat pelvic ultrasounds to discuss further plan  for surveillance (and recommendation for when next imaging would be) or other intervention. ? ?A copy of this note was sent to the patient's referring provider.  ? ?55 minutes of total time was spent for this patient encounter, including preparation, face-to-face counseling with the patient and coordination of care, and documentation of the encounter. ? ? ?Jeral Pinch, MD  ?Division of Gynecologic Oncology  ?Department of Obstetrics and Gynecology  ?[facility]  ?___________________________________________  ?Chief Complaint: ?Chief Complaint  ?Patient presents with  ? Ovarian cyst, left  ? ? ?History of Present Illness:  ?[name] is a 76 y.o. y.o. female who is seen in consultation at the request of Ronny Bacon, NP for an evaluation of an adnexal mass. ? 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  This was noted to be 3.3 cm on previous imaging in 09/2020. ?CA-125 on 09/11/2021 was 5.3 ?[facility] shows left ovary measuring 4.3 x 3.6 x 4 cm with a cyst measuring up to 3.9 cm, possible hemorrhage inferiorly.  No free fluid. ?[facility] 12/03/2021 shows left ovary measuring 4.9 x 4.2 x 4.8 cm with a cyst measuring up to 4 cm, notation of possible hemorrhage inferiorly. ? ?She  comes in with her daughter today.  She denies any abdominal or pelvic pain.  She has a long history of back pain and neck pain with prior compression fractures.  For the last week or so, she has had some increased low back pain.  A couple nights ago, there was radiation of this pain to bilateral hips and up along her sides to the mid waist.  This radiation lasted only briefly.  She  denies other associated symptoms.  She endorses a headache today.  She endorses a good appetite, has occasional nausea, denies any emesis.  Previously had some bloating but this has resolved, denies any bloating recently.  Has some baseline urinary and bowel urgency and incontinence.  Denies change from baseline.  Denies diarrhea although notes that the caliber of her stool has been a little bit more thin recently. ? ?PAST MEDICAL HISTORY:  ?Past Medical History:  ?Diagnosis Date  ? Asthma   ? on symbicort; rare albuterol use  ? BCC (basal cell carcinoma) 12/09/2012  ? right inner eye (Cx35FU)  ? CAD (coronary artery disease)   ? Cataracts, bilateral   ? Compression fracture of fifth lumbar vertebra ([place])   ? Depression   ? Dysrhythmia   ? Extremity pain   ? High cholesterol   ?  ? ?PAST SURGICAL HISTORY:  ?Past Surgical History:  ?Procedure Laterality Date  ? ABDOMINAL HYSTERECTOMY    ? AUGMENTATION MAMMAPLASTY Bilateral   ? BREAST ENHANCEMENT SURGERY    ? COLONOSCOPY    ? ESOPHAGEAL DILATION    ? EXCISION VAGINAL CYST    ? HEMORRHOID SURGERY    ? iterstim implant removal  2016  ? MASS EXCISION Left 09/12/2021  ? Procedure: EXCISION MASS LEFT NECK MASS;  Surgeon: Leta Baptist, MD;  Location: [facility];  Service: ENT;  Laterality: Left;  ? metal stimulator for bowel control    ? Removed  ? NASAL SEPTOPLASTY W/ TURBINOPLASTY Bilateral 09/12/2021  ? Procedure: NASAL SEPTOPLASTY WITH TURBINATE REDUCTION;  Surgeon: Leta Baptist, MD;  Location: [facility];  Service: ENT;  Laterality: Bilateral;  ? NOSE SURGERY    ? ? ?OB/GYN HISTORY:  ?OB History  ?Gravida Para Term Preterm AB Living  ?'4 4       4  '$ ?SAB IAB Ectopic Multiple Live Births  ?           ?  ?# Outcome Date GA Lbr Len/2nd Weight Sex Delivery Anes PTL Lv  ?4 Para           ?3 Para           ?2 Para           ?1 Para           ? ? ?No LMP recorded. Patient has had a hysterectomy. ? ?Age at menarche: 47 ?Age at menopause: Unsure, hysterectomy at age 35 ?Hx of HRT: yes, Denies any  current use ?Hx of STDs: Denies ?Last pap: 2012 ?History of abnormal pap smears: Denies ? ?SCREENING STUDIES:  ?Last mammogram: 10/2020  ?Last colonoscopy: Possibly 2012 ? ?MEDICATIONS: ?Outpatient Encounter Medications as of 01/22/2022  ?Medication Sig  ? albuterol (VENTOLIN HFA) 108 (90 Base) MCG/ACT inhaler Inhale 1 puff into the lungs every 4 (four) hours as needed for shortness of breath or wheezing.  ? atorvastatin (LIPITOR) 40 MG tablet TAKE 1 TABLET(40 MG) BY MOUTH DAILY  ? b complex vitamins capsule Take 1 capsule by mouth daily.  ? bismuth subsalicylate (PEPTO BISMOL) 262  MG/15ML suspension Take 30 mLs by mouth every 6 (six) hours as needed for indigestion.  ? Carboxymethylcellul-Glycerin (LUBRICATING EYE DROPS OP) Place 1 drop into both eyes daily as needed (dry eyes).  ? Cholecalciferol (D-3-5) 125 MCG (5000 UT) capsule Take 5,000 Units by mouth daily.  ? citalopram (CELEXA) 10 MG tablet Take 10 mg by mouth daily.  ? ezetimibe (ZETIA) 10 MG tablet Take 1 tablet (10 mg total) by mouth daily.  ? ketorolac (ACULAR) 0.5 % ophthalmic solution Place 1 drop into the right eye 4 (four) times daily.  ? lidocaine (LIDODERM) 5 % Place 1 patch onto the skin daily as needed (pain). Remove & Discard patch within 12 hours or as directed by MD  ? metoprolol tartrate (LOPRESSOR) 25 MG tablet Take 1 tablet (25 mg total) by mouth 2 (two) times daily.  ? Multiple Vitamin (MULTIVITAMIN) capsule Take 1 capsule by mouth daily.  ? prednisoLONE acetate (PRED FORTE) 1 % ophthalmic suspension 1 drop 2 (two) times daily.  ? SYMBICORT 80-4.5 MCG/ACT inhaler Inhale 2 puffs into the lungs 2 (two) times daily.  ? traZODone (DESYREL) 50 MG tablet Take 50 mg by mouth at bedtime as needed.  ? nicotine (NICODERM CQ - DOSED IN MG/24 HR) 7 mg/24hr patch Place 1 patch (7 mg total) onto the skin daily. (Patient not taking: Reported on 01/17/2022)  ? [DISCONTINUED] Zinc Sulfate (ZINC 15 PO) Take 15 mg by mouth daily. (Patient not taking: Reported  on 01/17/2022)  ? ?No facility-administered encounter medications on file as of 01/22/2022.  ? ? ?ALLERGIES:  ?Allergies  ?Allergen Reactions  ? Tolmetin Nausea And Vomiting  ? Codeine Nausea And Vomiting a

## 2022-01-22 NOTE — Patient Instructions (Addendum)
It was nice to meet you both today. ? ?As we discussed today, there is been a cyst in the area of your left ovary.  There does not appear to have been any significant change in size of the cyst over the last 8 months.  I cannot see the pictures from your OB/GYN's office, but based on the description, the cyst looks fluid-filled without features that raise the concern for cancer.  I would like to plan to repeat an ultrasound in about 2 months here at the hospital.  Given the size of the cyst, if it remains fluid-filled without concerning features and does not grow significantly, I do not feel that surgery is necessary.  If you were to start having increased symptoms and we noted that the cyst was growing on imaging or developed concerning findings (as we discussed today, like bumps along the surface of the cyst), then we would revisit a discussion of surgery. ? ?We will plan to repeat an ultrasound here at the hospital in approximately 2 months.  You and I will have a phone visit after to discuss the results and whether we are going to continue just following the cyst or if we need to discuss surgery again. ? ?Given your bowel symptoms, and the fact that you have not had a colonoscopy in more than 10 years, I would recommend evaluation by gastroenterology.  I am happy to put in a referral for a colonoscopy if that would be helpful. ?

## 2022-01-23 DIAGNOSIS — N1831 Chronic kidney disease, stage 3a: Secondary | ICD-10-CM | POA: Diagnosis not present

## 2022-01-23 DIAGNOSIS — I10 Essential (primary) hypertension: Secondary | ICD-10-CM | POA: Diagnosis not present

## 2022-01-23 DIAGNOSIS — E7800 Pure hypercholesterolemia, unspecified: Secondary | ICD-10-CM | POA: Diagnosis not present

## 2022-01-23 DIAGNOSIS — E559 Vitamin D deficiency, unspecified: Secondary | ICD-10-CM | POA: Diagnosis not present

## 2022-01-23 DIAGNOSIS — R7303 Prediabetes: Secondary | ICD-10-CM | POA: Diagnosis not present

## 2022-01-23 DIAGNOSIS — R5383 Other fatigue: Secondary | ICD-10-CM | POA: Diagnosis not present

## 2022-01-31 DIAGNOSIS — H903 Sensorineural hearing loss, bilateral: Secondary | ICD-10-CM | POA: Diagnosis not present

## 2022-02-07 DIAGNOSIS — N1831 Chronic kidney disease, stage 3a: Secondary | ICD-10-CM | POA: Diagnosis not present

## 2022-02-07 DIAGNOSIS — E782 Mixed hyperlipidemia: Secondary | ICD-10-CM | POA: Diagnosis not present

## 2022-02-07 DIAGNOSIS — M810 Age-related osteoporosis without current pathological fracture: Secondary | ICD-10-CM | POA: Diagnosis not present

## 2022-02-07 DIAGNOSIS — I10 Essential (primary) hypertension: Secondary | ICD-10-CM | POA: Diagnosis not present

## 2022-02-07 DIAGNOSIS — F0390 Unspecified dementia without behavioral disturbance: Secondary | ICD-10-CM | POA: Diagnosis not present

## 2022-02-07 DIAGNOSIS — I714 Abdominal aortic aneurysm, without rupture, unspecified: Secondary | ICD-10-CM | POA: Diagnosis not present

## 2022-02-07 DIAGNOSIS — Z0000 Encounter for general adult medical examination without abnormal findings: Secondary | ICD-10-CM | POA: Diagnosis not present

## 2022-02-08 ENCOUNTER — Other Ambulatory Visit: Payer: Self-pay | Admitting: Internal Medicine

## 2022-02-08 DIAGNOSIS — F0390 Unspecified dementia without behavioral disturbance: Secondary | ICD-10-CM

## 2022-02-16 ENCOUNTER — Emergency Department (HOSPITAL_COMMUNITY): Payer: PPO

## 2022-02-16 ENCOUNTER — Other Ambulatory Visit: Payer: Self-pay

## 2022-02-16 ENCOUNTER — Emergency Department (HOSPITAL_COMMUNITY)
Admission: EM | Admit: 2022-02-16 | Discharge: 2022-02-16 | Disposition: A | Discharge status: Home / Self Care | Payer: PPO | Attending: Emergency Medicine | Admitting: Emergency Medicine

## 2022-02-16 ENCOUNTER — Encounter (HOSPITAL_COMMUNITY): Payer: Self-pay | Admitting: Emergency Medicine

## 2022-02-16 DIAGNOSIS — M25431 Effusion, right wrist: Secondary | ICD-10-CM | POA: Diagnosis not present

## 2022-02-16 DIAGNOSIS — M25531 Pain in right wrist: Secondary | ICD-10-CM | POA: Insufficient documentation

## 2022-02-16 DIAGNOSIS — S52501A Unspecified fracture of the lower end of right radius, initial encounter for closed fracture: Secondary | ICD-10-CM | POA: Diagnosis not present

## 2022-02-16 DIAGNOSIS — S52571A Other intraarticular fracture of lower end of right radius, initial encounter for closed fracture: Secondary | ICD-10-CM | POA: Diagnosis not present

## 2022-02-16 DIAGNOSIS — M19031 Primary osteoarthritis, right wrist: Secondary | ICD-10-CM | POA: Diagnosis not present

## 2022-02-16 LAB — CBC
HCT: 38 % (ref 36.0–46.0)
Hemoglobin: 12.6 g/dL (ref 12.0–15.0)
MCH: 29.9 pg (ref 26.0–34.0)
MCHC: 33.2 g/dL (ref 30.0–36.0)
MCV: 90 fL (ref 80.0–100.0)
Platelets: 173 10*3/uL (ref 150–400)
RBC: 4.22 MIL/uL (ref 3.87–5.11)
RDW: 12.5 % (ref 11.5–15.5)
WBC: 9.2 10*3/uL (ref 4.0–10.5)
nRBC: 0 % (ref 0.0–0.2)

## 2022-02-16 LAB — BASIC METABOLIC PANEL
Anion gap: 8 (ref 5–15)
BUN: 18 mg/dL (ref 8–23)
CO2: 24 mmol/L (ref 22–32)
Calcium: 9.1 mg/dL (ref 8.9–10.3)
Chloride: 109 mmol/L (ref 98–111)
Creatinine, Ser: 0.88 mg/dL (ref 0.44–1.00)
GFR, Estimated: >60 mL/min (ref >60)
Glucose, Bld: 114 mg/dL — ABNORMAL HIGH (ref 70–99)
Potassium: 3.9 mmol/L (ref 3.5–5.1)
Sodium: 141 mmol/L (ref 135–145)

## 2022-02-16 LAB — SEDIMENTATION RATE: Sed Rate: 17 mm/hr (ref 0–22)

## 2022-02-16 LAB — C-REACTIVE PROTEIN: CRP: 0.6 mg/dL (ref <1.0)

## 2022-02-16 MED ORDER — ACETAMINOPHEN 500 MG PO TABS
1000.0000 mg | ORAL_TABLET | Freq: Once | ORAL | Status: DC
Start: 2022-02-16 — End: 2022-02-16
  Filled 2022-02-16: qty 2

## 2022-02-16 MED ORDER — TRAMADOL HCL 50 MG PO TABS
50.0000 mg | ORAL_TABLET | Freq: Once | ORAL | Status: AC
  Administered 2022-02-16: 50 mg via ORAL
  Filled 2022-02-16: qty 1

## 2022-02-16 MED ORDER — LOPERAMIDE HCL 2 MG PO CAPS
2.0000 mg | ORAL_CAPSULE | Freq: Once | ORAL | Status: AC
  Administered 2022-02-16: 2 mg via ORAL
  Filled 2022-02-16: qty 1

## 2022-02-16 MED ORDER — TRAMADOL HCL 50 MG PO TABS
50.0000 mg | ORAL_TABLET | Freq: Three times a day (TID) | ORAL | 0 refills | Status: AC | PRN
Start: 2022-02-16 — End: 2022-02-21

## 2022-02-16 NOTE — ED Triage Notes (Signed)
Patient woke up this morning with right hand /right wrist and forearm pain , denies injury , mild swelling with no deformity.

## 2022-02-16 NOTE — ED Provider Notes (Signed)
  Physical Exam  BP 108/68   Pulse 63   Temp 99.4 F (37.4 C)   Resp 19   SpO2 96%   Physical Exam  Procedures  Procedures  ED Course / MDM   Clinical Course as of 02/16/22 1713  Sat Feb 16, 2022  3668 Basic metabolic panel(!) [AH]  1594 Sedimentation rate [AH]  1514 C-reactive protein [AH]  1514 CBC Labs without significant finding [AH]    Clinical Course User Index [AH] Margarita Mail, PA-C   Medical Decision Making Amount and/or Complexity of Data Reviewed Labs: ordered. Decision-making details documented in ED Course. Radiology: ordered.  Risk OTC drugs. Prescription drug management.   76 year old female with a history of dementia and carpal tunnel syndrome presenting to the ED with right wrist pain and swelling.  She denies any known traumatic injuries.  She has received tramadol as well as a thumb spica splint with improvement.  She has no leukocytosis and ESR and CRP are not elevated so lower suspicion for septic joint.  X-ray was negative for acute abnormality.  MRI of the right wrist is pending.  MRI shows a small nondisplaced fracture of the distal radius, small effusions, possible TFCC tear.  I discussed the findings of the MRI with both the patient and her family member in the room.  Patient was placed in a sugar-tong splint and instructed to follow-up with orthopedic hand surgery next week for further management.  I also recommended she alternate Tylenol and Motrin every 3-4 hours as needed for pain.  She was also given a prescription for tramadol for breakthrough/severe pain, but was advised not to take her trazodone while on this medication.  Patient verbalizes understanding.  Strict return precautions were discussed and the patient was discharged home in stable condition.       Sondra Come, MD 02/16/22 1714    Sherwood Gambler, MD 02/16/22 [phone]

## 2022-02-16 NOTE — ED Provider Notes (Signed)
[facility] EMERGENCY DEPARTMENT Provider Note   CSN: [id number] Arrival date & time: 02/16/22  8786     History  Chief Complaint  Patient presents with   Hand/Arm Pain     Brenda CORSELLO is a 76 y.o. female who is attended by her neighbor. Her neighbor indicates that she has significant memory deficits.  This limits history taking from the patient.  Patient's neighbor is in touch with the patient's daughter and was texted her medication list and allergies.  Patient has a history of tobacco dependence, hyperlipidemia, vascular disease, carpal tunnel syndrome.  She is complaining of severe pain in her right wrist.  She states that her wrist hurts whenever she tries to move her wrist at all.  She does not remember injuring her arm.  Her neighbor states that the patient woke up to find coffee grounds all over her kitchen but does not remember what she did in the middle of the night.  Patient is not on any blood thinners.  HPI     Home Medications Prior to Admission medications   Medication Sig Start Date End Date Taking? Authorizing Provider  albuterol (VENTOLIN HFA) 108 (90 Base) MCG/ACT inhaler Inhale 1 puff into the lungs every 4 (four) hours as needed for shortness of breath or wheezing. 06/28/20   [provider]  atorvastatin (LIPITOR) 40 MG tablet TAKE 1 TABLET(40 MG) BY MOUTH DAILY 11/05/21   Patwardhan, Manish J, MD  b complex vitamins capsule Take 1 capsule by mouth daily.    [provider]  bismuth subsalicylate (PEPTO BISMOL) 262 MG/15ML suspension Take 30 mLs by mouth every 6 (six) hours as needed for indigestion.    [provider]  Carboxymethylcellul-Glycerin (LUBRICATING EYE DROPS OP) Place 1 drop into both eyes daily as needed (dry eyes).    [provider]  Cholecalciferol (D-3-5) 125 MCG (5000 UT) capsule Take 5,000 Units by mouth daily.    [provider]  citalopram (CELEXA) 10 MG tablet Take 10 mg by mouth  daily.    [provider]  ezetimibe (ZETIA) 10 MG tablet Take 1 tablet (10 mg total) by mouth daily. 11/05/21   Patwardhan, Reynold Bowen, MD  ketorolac (ACULAR) 0.5 % ophthalmic solution Place 1 drop into the right eye 4 (four) times daily. 01/02/22   [provider]  lidocaine (LIDODERM) 5 % Place 1 patch onto the skin daily as needed (pain). Remove & Discard patch within 12 hours or as directed by MD    [provider]  metoprolol tartrate (LOPRESSOR) 25 MG tablet Take 1 tablet (25 mg total) by mouth 2 (two) times daily. 11/05/21   Patwardhan, Reynold Bowen, MD  Multiple Vitamin (MULTIVITAMIN) capsule Take 1 capsule by mouth daily.    [provider]  nicotine (NICODERM CQ - DOSED IN MG/24 HR) 7 mg/24hr patch Place 1 patch (7 mg total) onto the skin daily. Patient not taking: Reported on 01/17/2022 11/05/21   Nigel Mormon, MD  prednisoLONE acetate (PRED FORTE) 1 % ophthalmic suspension 1 drop 2 (two) times daily. 12/22/21   [provider]  SYMBICORT 80-4.5 MCG/ACT inhaler Inhale 2 puffs into the lungs 2 (two) times daily. 10/30/21   Brand Males, MD  traZODone (DESYREL) 50 MG tablet Take 50 mg by mouth at bedtime as needed. 10/24/21   [provider]      Allergies    Tolmetin, Codeine, Lovastatin, Milk-related compounds, Mirtazapine, Nsaids, Vilazodone hcl, and Oxycodone  Review of Systems   Review of Systems  Physical Exam Updated Vital Signs BP (!) 140/59 (BP Location: Left Arm)   Pulse 65   Temp 98.2 F (36.8 C) (Oral)   Resp 17   SpO2 99%  Physical Exam Vitals and nursing note reviewed.  Constitutional:      General: She is not in acute distress.    Appearance: She is well-developed. She is not diaphoretic.  HENT:     Head: Normocephalic and atraumatic.     Right Ear: External ear normal.     Left Ear: External ear normal.     Nose: Nose normal.     Mouth/Throat:     Mouth: Mucous membranes are moist.  Eyes:     General:  No scleral icterus.    Conjunctiva/sclera: Conjunctivae normal.  Cardiovascular:     Rate and Rhythm: Normal rate and regular rhythm.     Heart sounds: Normal heart sounds. No murmur heard.   No friction rub. No gallop.  Pulmonary:     Effort: Pulmonary effort is normal. No respiratory distress.     Breath sounds: Normal breath sounds.  Abdominal:     General: Bowel sounds are normal. There is no distension.     Palpations: Abdomen is soft. There is no mass.     Tenderness: There is no abdominal tenderness. There is no guarding.  Musculoskeletal:     Cervical back: Normal range of motion.     Comments: Right wrist is significantly circumferentially swollen, exquisitely tender to palpation and any movement of the wrist or right thumb.  No obvious erythema.  2+ radial pulse.  Skin:    General: Skin is warm and dry.  Neurological:     Mental Status: She is alert. Mental status is at baseline.  Psychiatric:        Behavior: Behavior normal.    ED Results / Procedures / Treatments   Labs (all labs ordered are listed, but only abnormal results are displayed) Labs Reviewed - No data to display  EKG None  Radiology No results found.  Procedures Procedures    Medications Ordered in ED Medications - No data to display  ED Course/ Medical Decision Making/ A&P Clinical Course as of 02/16/22 1514  Sat Feb 16, 2022  9563 Basic metabolic panel(!) [AH]  8756 Sedimentation rate [AH]  1514 C-reactive protein [AH]  1514 CBC Labs without significant finding [AH]    Clinical Course User Index [AH] Margarita Mail, PA-C                           Medical Decision Making Patient here with severe R wrist pain and sewelling. She has dementia and is a poor historian.  Ddx includes septic joint. Carpal tunnel, cyrstalopathy arthritis, and trauma Patient;s labs and imaging reassuring.  Seen and shared visit with Dr. Sherry Ruffing.  Currently awaiting MRI of the right wrist.  Patient is  currently in a thumb spica wrist brace . She was given tramadol with pain improvement.  This is the only pain medication she can tolerate.  S/O at shift change to Dr. Royce Macadamia  Given a dose of Imodium due to chronic diarrhea.  Amount and/or Complexity of Data Reviewed Labs: ordered. Decision-making details documented in ED Course. Radiology: ordered and independent interpretation performed. Decision-making details documented in ED Course.  Risk OTC drugs. Prescription drug management.           Final Clinical Impression(s) /  ED Diagnoses Final diagnoses:  None    Rx / DC Orders ED Discharge Orders     None         Margarita Mail, PA-C 02/16/22 1516    Tegeler, Gwenyth Allegra, MD 02/18/22 [phone]

## 2022-02-16 NOTE — Progress Notes (Signed)
Orthopedic Tech Progress Note Patient Details:  Brenda Franklin 06/08/1946 [id number]  Ortho Devices Type of Ortho Device: Sugartong splint, Arm sling Ortho Device/Splint Location: Right arm Ortho Device/Splint Interventions: Application   Post Interventions Patient Tolerated: Well Instructions Provided: Care of device  Brenda Franklin 02/16/2022, 5:28 PM

## 2022-02-16 NOTE — Progress Notes (Signed)
Orthopedic Tech Progress Note Patient Details:  Brenda Franklin 1945/10/22 [id number]  Ortho Devices Type of Ortho Device: Thumb velcro splint Ortho Device/Splint Location: Right thumb/wrist Ortho Device/Splint Interventions: Application   Post Interventions Patient Tolerated: Well Instructions Provided: Adjustment of device  Brenda Franklin Brenda Franklin 02/16/2022, 11:12 AM

## 2022-02-16 NOTE — Discharge Instructions (Addendum)
Alternate Tylenol and Motrin every 3-4 hours as needed for pain. You can also take Tramadol, 1 pill every 8 hours as needed for severe pain. DO NOT take you trazodone while taking the Tramadol.  MRI of the Right wrist shows: 1. Acute essentially nondisplaced intra-articular fracture of the distal radius. 2. Increased T2 signal within the distal ulna along its radial aspect has appearance more suggestive of intraosseous ganglion formation, although a nondisplaced fracture is not excluded. 3. Mild bone marrow edema within the proximal scaphoid pole without associated fracture line, likely representing a bone contusion. 4. Small radiocarpal, midcarpal, and distal radioulnar joint effusions. 5. Scapholunate ligament sprain or degeneration without evidence of tear. 6. Findings suspicious for TFCC tear.

## 2022-02-19 DIAGNOSIS — S52531A Colles' fracture of right radius, initial encounter for closed fracture: Secondary | ICD-10-CM | POA: Diagnosis not present

## 2022-03-06 ENCOUNTER — Ambulatory Visit
Admission: RE | Admit: 2022-03-06 | Discharge: 2022-03-06 | Disposition: A | Discharge status: Home / Self Care | Payer: PPO | Source: Ambulatory Visit | Attending: Internal Medicine | Admitting: Internal Medicine

## 2022-03-06 DIAGNOSIS — F0390 Unspecified dementia without behavioral disturbance: Secondary | ICD-10-CM

## 2022-03-06 DIAGNOSIS — I6529 Occlusion and stenosis of unspecified carotid artery: Secondary | ICD-10-CM | POA: Diagnosis not present

## 2022-03-12 DIAGNOSIS — S52531A Colles' fracture of right radius, initial encounter for closed fracture: Secondary | ICD-10-CM | POA: Diagnosis not present

## 2022-03-18 ENCOUNTER — Ambulatory Visit (HOSPITAL_COMMUNITY)
Admission: RE | Admit: 2022-03-18 | Discharge: 2022-03-18 | Disposition: A | Discharge status: Home / Self Care | Payer: PPO | Source: Ambulatory Visit | Attending: Gynecologic Oncology | Admitting: Gynecologic Oncology

## 2022-03-18 DIAGNOSIS — N83292 Other ovarian cyst, left side: Secondary | ICD-10-CM | POA: Diagnosis not present

## 2022-03-18 DIAGNOSIS — N83202 Unspecified ovarian cyst, left side: Secondary | ICD-10-CM | POA: Diagnosis not present

## 2022-03-18 DIAGNOSIS — Z90710 Acquired absence of both cervix and uterus: Secondary | ICD-10-CM | POA: Diagnosis not present

## 2022-03-20 ENCOUNTER — Inpatient Hospital Stay: Payer: PPO | Attending: Gynecologic Oncology | Admitting: Gynecologic Oncology

## 2022-03-20 ENCOUNTER — Encounter: Payer: Self-pay | Admitting: Gynecologic Oncology

## 2022-03-20 DIAGNOSIS — N83202 Unspecified ovarian cyst, left side: Secondary | ICD-10-CM | POA: Diagnosis not present

## 2022-03-20 NOTE — Progress Notes (Signed)
Gynecologic Oncology Telehealth Note: Gyn-Onc  I connected with Tretha Sciara on 03/20/22 at  4:00 PM EDT by telephone and verified that I am speaking with the correct person using two identifiers.  I discussed the limitations, risks, security and privacy concerns of performing an evaluation and management service by telemedicine and the availability of in-person appointments. I also discussed with the patient that there may be a patient responsible charge related to this service. The patient expressed understanding and agreed to proceed.  Other persons participating in the visit and their role in the encounter: [name], daughter.  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  This was noted to be 3.3 cm on previous imaging in 09/2020. CA-125 on 09/11/2021 was 5.3 [facility]2021 shows left ovary measuring 4.3 x 3.6 x 4 cm with a cyst measuring up to 3.9 cm, possible hemorrhage inferiorly.  No free fluid. Pelvic ultrasound exam at [facility] on 12/03/2021 shows left ovary measuring 4.9 x 4.2 x 4.8 cm with a cyst measuring up to 4 cm, notation of possible hemorrhage inferiorly.  Interval History: Seen in ED for wrist injury. Patient is doing well. Denies any abdominal pain, had one episode of mild left sided pain since I saw her in clinic.  Past Medical/Surgical History: Past Medical History:  Diagnosis Date   Asthma    on symbicort; rare albuterol use   BCC (basal cell carcinoma) 12/09/2012   right inner eye ([id number])   CAD (coronary artery disease)    Cataracts, bilateral    Compression fracture  of fifth lumbar vertebra (HCC)    Depression    Dysrhythmia    Extremity pain    High cholesterol     Past Surgical History:  Procedure Laterality Date   ABDOMINAL HYSTERECTOMY     AUGMENTATION MAMMAPLASTY Bilateral    BREAST ENHANCEMENT SURGERY     COLONOSCOPY     ESOPHAGEAL DILATION     EXCISION VAGINAL CYST     HEMORRHOID SURGERY     iterstim implant removal  2016   MASS EXCISION Left 09/12/2021   Procedure: EXCISION MASS LEFT NECK MASS;  Surgeon: Leta Baptist, MD;  Location: [facility];  Service: ENT;  Laterality: Left;   metal stimulator for bowel control     Removed   NASAL SEPTOPLASTY W/ TURBINOPLASTY Bilateral 09/12/2021   Procedure: NASAL SEPTOPLASTY WITH TURBINATE REDUCTION;  Surgeon: Leta Baptist, MD;  Location: [facility] OR;  Service: ENT;  Laterality: Bilateral;   NOSE SURGERY      Family History  Problem Relation Age of Onset   Other Mother        Intestinal problem - gangrene   Cancer Father        Bile Duct Cancer   Heart disease Father    Heart attack Father    Hypercholesterolemia Father    Bone cancer Sister    Bone cancer Brother    Colon cancer Neg Hx    Breast cancer Neg Hx    Ovarian cancer Neg Hx    Endometrial cancer Neg Hx    Pancreatic cancer Neg Hx    Prostate cancer Neg Hx  Social History   Socioeconomic History   Marital status: Divorced    Spouse name: Not on file   Number of children: 4   Years of education: 1 year of college   Highest education level: Not on file  Occupational History   Occupation: Retired  Tobacco Use   Smoking status: Every Day    Packs/day: 0.50    Types: Cigarettes   Smokeless tobacco: Never  Vaping Use   Vaping Use: Never used  Substance and Sexual Activity   Alcohol use: No   Drug use: No   Sexual activity: Not on file  Other Topics Concern   Not on file  Social History Narrative   Lives at home with her daughter.   Right-handed.   2-3 cups caffeine daily.   Social Determinants of Health   Financial  Resource Strain: Not on file  Food Insecurity: Not on file  Transportation Needs: Not on file  Physical Activity: Not on file  Stress: Not on file  Social Connections: Not on file    Current Medications:  Current Outpatient Medications:    albuterol (VENTOLIN HFA) 108 (90 Base) MCG/ACT inhaler, Inhale 1 puff into the lungs every 4 (four) hours as needed for shortness of breath or wheezing., Disp: , Rfl:    atorvastatin (LIPITOR) 40 MG tablet, TAKE 1 TABLET(40 MG) BY MOUTH DAILY, Disp: 90 tablet, Rfl: 3   b complex vitamins capsule, Take 1 capsule by mouth daily., Disp: , Rfl:    bismuth subsalicylate (PEPTO BISMOL) 262 MG/15ML suspension, Take 30 mLs by mouth every 6 (six) hours as needed for indigestion., Disp: , Rfl:    Carboxymethylcellul-Glycerin (LUBRICATING EYE DROPS OP), Place 1 drop into both eyes daily as needed (dry eyes)., Disp: , Rfl:    Cholecalciferol (D-3-5) 125 MCG (5000 UT) capsule, Take 5,000 Units by mouth daily., Disp: , Rfl:    citalopram (CELEXA) 10 MG tablet, Take 10 mg by mouth daily., Disp: , Rfl:    ezetimibe (ZETIA) 10 MG tablet, Take 1 tablet (10 mg total) by mouth daily., Disp: 90 tablet, Rfl: 3   ketorolac (ACULAR) 0.5 % ophthalmic solution, Place 1 drop into the right eye 4 (four) times daily., Disp: , Rfl:    lidocaine (LIDODERM) 5 %, Place 1 patch onto the skin daily as needed (pain). Remove & Discard patch within 12 hours or as directed by MD, Disp: , Rfl:    metoprolol tartrate (LOPRESSOR) 25 MG tablet, Take 1 tablet (25 mg total) by mouth 2 (two) times daily., Disp: 180 tablet, Rfl: 3   Multiple Vitamin (MULTIVITAMIN) capsule, Take 1 capsule by mouth daily., Disp: , Rfl:    nicotine (NICODERM CQ - DOSED IN MG/24 HR) 7 mg/24hr patch, Place 1 patch (7 mg total) onto the skin daily. (Patient not taking: Reported on 01/17/2022), Disp: 28 patch, Rfl: 3   prednisoLONE acetate (PRED FORTE) 1 % ophthalmic suspension, 1 drop 2 (two) times daily., Disp: , Rfl:     SYMBICORT 80-4.5 MCG/ACT inhaler, Inhale 2 puffs into the lungs 2 (two) times daily., Disp: 30.6 g, Rfl: 2   traZODone (DESYREL) 50 MG tablet, Take 50 mg by mouth at bedtime as needed., Disp: , Rfl:   Review of Symptoms: Pertinent positives as per HPI.  Physical Exam: There were no vitals taken for this visit. Deferred given limitations of phone visit.  Laboratory & Radiologic Studies: Pelvic ultrasound 03/18/22: IMPRESSION: Surgical absence of uterus with nonvisualization of RIGHT ovary.   Simple appearing  cyst LEFT ovary 4.0 cm greatest diameter; Recommend followup US in 3-6 months. Note: This recommendation does not apply to premenarchal patients or to those with increased risk (genetic, family history, elevated tumor markers or other high-risk factors) of ovarian cancer. Reference: Radiology 2019 Nov; 293(2):359-371.  Assessment & Plan: Brenda Franklin is a 76 y.o. woman with simple adnexal mass.  Patient is unchanged in terms of the symptomatology from her visit with me.  We discussed ultrasound findings which show a benign cyst within the adnexa.  This has not grown since her last ultrasound and remains without any complex features which would raise the concern for malignancy.  I suggest we get a repeat ultrasound in 6 months and if this shows stable size of the cyst without any concerning features, it would be reasonable to discontinue any additional surveillance imaging unless the patient were to start having new symptoms.  Patient and her daughter mandible with this plan.  Ultrasound order was placed and I sent a message to my office to work on getting this scheduled in 6 months.  I discussed the assessment and treatment plan with the patient. The patient was provided with an opportunity to ask questions and all were answered. The patient agreed with the plan and demonstrated an understanding of the instructions.   The patient was advised to call back or see an in-person evaluation  if the symptoms worsen or if the condition fails to improve as anticipated.   12 minutes of total time was spent for this patient encounter, including preparation, phone counseling with the patient and coordination of care, and documentation of the encounter.   Jeral Pinch, MD  Division of Gynecologic Oncology  Department of Obstetrics and Gynecology  [facility]

## 2022-04-04 ENCOUNTER — Telehealth: Payer: Self-pay | Admitting: *Deleted

## 2022-04-04 NOTE — Telephone Encounter (Signed)
Per Dr Berline Lopes scheduled an [place] scan for 12/12 with a phone visit to follow. Patient's daughter notified

## 2022-04-09 DIAGNOSIS — S52531A Colles' fracture of right radius, initial encounter for closed fracture: Secondary | ICD-10-CM | POA: Diagnosis not present

## 2022-04-10 DIAGNOSIS — E782 Mixed hyperlipidemia: Secondary | ICD-10-CM | POA: Diagnosis not present

## 2022-04-10 DIAGNOSIS — I10 Essential (primary) hypertension: Secondary | ICD-10-CM | POA: Diagnosis not present

## 2022-04-10 DIAGNOSIS — I714 Abdominal aortic aneurysm, without rupture, unspecified: Secondary | ICD-10-CM | POA: Diagnosis not present

## 2022-04-10 DIAGNOSIS — Z1159 Encounter for screening for other viral diseases: Secondary | ICD-10-CM | POA: Diagnosis not present

## 2022-04-10 DIAGNOSIS — N1831 Chronic kidney disease, stage 3a: Secondary | ICD-10-CM | POA: Diagnosis not present

## 2022-04-10 DIAGNOSIS — F0390 Unspecified dementia without behavioral disturbance: Secondary | ICD-10-CM | POA: Diagnosis not present

## 2022-04-10 DIAGNOSIS — M810 Age-related osteoporosis without current pathological fracture: Secondary | ICD-10-CM | POA: Diagnosis not present

## 2022-04-17 DIAGNOSIS — R197 Diarrhea, unspecified: Secondary | ICD-10-CM | POA: Diagnosis not present

## 2022-04-17 DIAGNOSIS — E782 Mixed hyperlipidemia: Secondary | ICD-10-CM | POA: Diagnosis not present

## 2022-04-17 DIAGNOSIS — N1831 Chronic kidney disease, stage 3a: Secondary | ICD-10-CM | POA: Diagnosis not present

## 2022-04-17 DIAGNOSIS — I10 Essential (primary) hypertension: Secondary | ICD-10-CM | POA: Diagnosis not present

## 2022-04-17 DIAGNOSIS — F02B0 Dementia in other diseases classified elsewhere, moderate, without behavioral disturbance, psychotic disturbance, mood disturbance, and anxiety: Secondary | ICD-10-CM | POA: Diagnosis not present

## 2022-04-17 DIAGNOSIS — G301 Alzheimer's disease with late onset: Secondary | ICD-10-CM | POA: Diagnosis not present

## 2022-05-06 ENCOUNTER — Ambulatory Visit: Payer: PPO | Admitting: Cardiology

## 2022-05-06 ENCOUNTER — Encounter: Payer: Self-pay | Admitting: Cardiology

## 2022-05-06 VITALS — BP 128/63 | HR 74 | Temp 98.1°F | Resp 16 | Ht 64.0 in | Wt 138.8 lb

## 2022-05-06 DIAGNOSIS — I2510 Atherosclerotic heart disease of native coronary artery without angina pectoris: Secondary | ICD-10-CM | POA: Diagnosis not present

## 2022-05-06 DIAGNOSIS — I739 Peripheral vascular disease, unspecified: Secondary | ICD-10-CM

## 2022-05-06 MED ORDER — CLOPIDOGREL BISULFATE 75 MG PO TABS: 75.0000 mg | ORAL_TABLET | Freq: Every day | ORAL | 3 refills | Status: DC

## 2022-05-06 MED ORDER — ROSUVASTATIN CALCIUM 20 MG PO TABS: 20.0000 mg | ORAL_TABLET | Freq: Every day | ORAL | 3 refills | Status: DC

## 2022-05-06 NOTE — Progress Notes (Addendum)
Patient referred by Janie Morning, DO for back pain, possible angina equivalent  Subjective:   Brenda Franklin, female    DOB: 03/15/1946, 76 y.o.   MRN: [id number]   Chief Complaint  Patient presents with   Coronary Artery Disease   Follow-up    HPI  76 y.o. Caucasian female with hypertension, hyperlipidemia, tobacco dependence, coronary artery disease, penetrating ulcer of infrarenal abdominal aorta  Patient is here with her daughter.  She denies any chest pain.  She has generalized weakness, lack of energy.  She also reports pain in her legs, which she is concerned is due to atorvastatin.  In addition, she is being treated for possible Alzheimer's dementia.  Last weekend, she had an emotional breakdown with grief for her son who passed 2 years ago.    Current Outpatient Medications:    albuterol (VENTOLIN HFA) 108 (90 Base) MCG/ACT inhaler, Inhale 1 puff into the lungs every 4 (four) hours as needed for shortness of breath or wheezing., Disp: , Rfl:    atorvastatin (LIPITOR) 40 MG tablet, TAKE 1 TABLET(40 MG) BY MOUTH DAILY, Disp: 90 tablet, Rfl: 3   b complex vitamins capsule, Take 1 capsule by mouth daily., Disp: , Rfl:    bismuth subsalicylate (PEPTO BISMOL) 262 MG/15ML suspension, Take 30 mLs by mouth every 6 (six) hours as needed for indigestion., Disp: , Rfl:    Carboxymethylcellul-Glycerin (LUBRICATING EYE DROPS OP), Place 1 drop into both eyes daily as needed (dry eyes)., Disp: , Rfl:    Cholecalciferol (D-3-5) 125 MCG (5000 UT) capsule, Take 5,000 Units by mouth daily., Disp: , Rfl:    citalopram (CELEXA) 10 MG tablet, Take 10 mg by mouth daily., Disp: , Rfl:    ezetimibe (ZETIA) 10 MG tablet, Take 1 tablet (10 mg total) by mouth daily., Disp: 90 tablet, Rfl: 3   ketorolac (ACULAR) 0.5 % ophthalmic solution, Place 1 drop into the right eye 4 (four) times daily., Disp: , Rfl:    lidocaine (LIDODERM) 5 %, Place 1 patch onto the skin daily as needed (pain). Remove & Discard  patch within 12 hours or as directed by MD, Disp: , Rfl:    metoprolol tartrate (LOPRESSOR) 25 MG tablet, Take 1 tablet (25 mg total) by mouth 2 (two) times daily., Disp: 180 tablet, Rfl: 3   Multiple Vitamin (MULTIVITAMIN) capsule, Take 1 capsule by mouth daily., Disp: , Rfl:    nicotine (NICODERM CQ - DOSED IN MG/24 HR) 7 mg/24hr patch, Place 1 patch (7 mg total) onto the skin daily. (Patient not taking: Reported on 01/17/2022), Disp: 28 patch, Rfl: 3   prednisoLONE acetate (PRED FORTE) 1 % ophthalmic suspension, 1 drop 2 (two) times daily., Disp: , Rfl:    SYMBICORT 80-4.5 MCG/ACT inhaler, Inhale 2 puffs into the lungs 2 (two) times daily., Disp: 30.6 g, Rfl: 2   traZODone (DESYREL) 50 MG tablet, Take 50 mg by mouth at bedtime as needed., Disp: , Rfl:   Cardiovascular and other pertinent studies:  EKG 11/05/2021: Sinus rhythm 62 bpm Normal EKG  CTA abdomen 09/2020: 1. Penetrating atherosclerotic ulceration arising from the left lateral wall of the infrarenal abdominal aorta results in focal aneurysmal dilation of the aorta with a maximal diameter of 2.4 cm (1.5 times the diameter of the adjacent normal aorta which is measured at 1.6 cm). Due to the suspected etiology of this abnormality, traditional follow-up recommendations for abdominal aortic aneurysm may not be adequate. Consider initiation of statin therapy to stabilize the  underlying plaque as well as initial follow-up CT arteriogram in 6 months. Aortic Atherosclerosis (ICD10-I70.0); Aortic aneurysm NOS (ICD10-I71.9). 2. Coronary artery calcifications.   NON-VASCULAR   1. Simple appearing 3.4 cm left ovarian cystic lesion, likely benign. Recommend follow-up US in 6-12 months. Note: This recommendation does not apply to premenarchal patients and to those with increased risk (genetic, family history, elevated tumor markers or other high-risk factors) of ovarian cancer. Reference: JACR 2020 Feb; 17(2):248-254 2. 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 3. Colonic diverticular disease without CT evidence of active inflammation. 4. Chronic T12 and L4 compression fractures.  Lexiscan/modified Bruce Tetrofosmin stress test 06/21/2020: Lexiscan/modified Bruce nuclear stress test performed using 1-day protocol. Stress EKG is non-diagnostic, as this is pharmacological stress test. In addition, stress EKG at 82% MPHR showed no ischemic changes.  Small sized, mild intensity, reversible perfusion defect in basal inferoseptal myocardium.  Low risk study.  Stress LVEF 69%.  Echocardiogram 06/20/2020:  Left ventricle cavity is normal in size and wall thickness. Normal global  wall motion. Normal LV systolic function with EF 55%. Doppler evidence of  grade I (impaired) diastolic dysfunction, normal LAP. Calculated EF 55%.  Trileaflet aortic valve.  Trace aortic regurgitation.  Mild mitral annular calcification. Trace mitral stenosis. Moderate (Grade  II) mitral regurgitation.  Inadequate TR jet to estimate pulmonary artery systolic pressure. Normal  right atrial pressure.   ABI 06/19/2020:  This exam reveals mildly decreased perfusion of the right lower extremity,  noted at the anterior tibial and post tibial artery level (ABI 0.88) and  mildly decreased perfusion of the left lower extremity, noted at the post  tibial artery level (ABI 0.88).  Mildly abnormal biphasic waveforms at the  bilateral AT.   CT Cardiac scoring 06/01/2020: Total score: 867 LM: 94 LAD: 501 LCx: 0 RCA: 272  EKG 05/25/2020: Sinus rhythm 62 bpm Normal EKG  Recent labs: 02/16/2022: Glucose 114, BUN/Cr 18/0.88. EGFR >60. Na/K 141/3.9.  H/H 12/38. MCV 90. Platelets 173   Review of Systems  Cardiovascular:  Negative for chest pain, dyspnea on exertion, leg swelling, palpitations and syncope.  Musculoskeletal:  Positive for back pain.         Vitals:   05/06/22 1308  BP: 128/63  Pulse: 74  Resp: 16  Temp:  98.1 F (36.7 C)  SpO2: 96%     Body mass index is 23.82 kg/m. Filed Weights   05/06/22 1308  Weight: 138 lb 12.8 oz (63 kg)     Objective:   Physical Exam Vitals and nursing note reviewed.  Constitutional:      General: She is not in acute distress. Neck:     Vascular: No JVD.  Cardiovascular:     Rate and Rhythm: Normal rate and regular rhythm.     Pulses:          Dorsalis pedis pulses are 0 on the right side and 0 on the left side.       Posterior tibial pulses are 1+ on the right side and 1+ on the left side.     Heart sounds: Normal heart sounds. No murmur heard. Pulmonary:     Effort: Pulmonary effort is normal.     Breath sounds: Normal breath sounds. No wheezing or rales.  Musculoskeletal:     Right lower leg: No edema.     Left lower leg: No edema.         Assessment & Recommendations:   76 y.o. Caucasian  female with hypertension, hyperlipidemia, tobacco dependence, coronary artery disease, penetrating ulcer of infrarenal abdominal aorta  Penetrating ulcer of aorta: Stable, has annual follow-up with vascular surgery.  CAD: Elevated calcium score and mildly abnormal stress test.  No overt angina/angina equivalent symptom. Will add Aspirin 81 mg 2-3 days a week. (Does not want to take it everyday due to GI intolerance). Continue metoprolol tartarate 25 mg bid, Zetia 10 mg daily. Changing Lipitor to Crestor 20 mg daily.  This is due to patient's concern that her generalized weakness is due to Lipitor.  While I am not convinced, it is worth trying alternate statin.  If she does not tolerate Crestor either, will consider a Leqvio Repatha.  Claudication:  Recommend regular walking.  Added Pletal 50 mg twice daily to assist with claudication symptoms.  Continue aggressive risk factor modification.   Will add Aspirin 81 mg 2-3 days a week. (Does not want to take it everyday due to GI intolerance).  F/u in 3 months    Nigel Mormon, MD Pager:  [phone] Office: [phone]

## 2022-05-08 DIAGNOSIS — H59031 Cystoid macular edema following cataract surgery, right eye: Secondary | ICD-10-CM | POA: Diagnosis not present

## 2022-05-08 DIAGNOSIS — H35033 Hypertensive retinopathy, bilateral: Secondary | ICD-10-CM | POA: Diagnosis not present

## 2022-05-08 DIAGNOSIS — H353131 Nonexudative age-related macular degeneration, bilateral, early dry stage: Secondary | ICD-10-CM | POA: Diagnosis not present

## 2022-05-08 DIAGNOSIS — H04123 Dry eye syndrome of bilateral lacrimal glands: Secondary | ICD-10-CM | POA: Diagnosis not present

## 2022-05-16 ENCOUNTER — Telehealth: Payer: Self-pay

## 2022-05-16 MED ORDER — ASPIRIN 81 MG PO TBEC: 81.0000 mg | DELAYED_RELEASE_TABLET | ORAL | 3 refills | Status: DC

## 2022-05-16 NOTE — Telephone Encounter (Signed)
Pt daughter called to inform us that pt was last seen by you on 05/06/2022 and she mention that you had decided at the end of the visit not to give her any blood thinners. Pt daughter also mention that the Plavix interferes with pts   Trazodone '50mg'$  and citalopram '10mg'$ . Pt daughter also mention pt does not want to be on that medication neither. Please advice.

## 2022-05-16 NOTE — Addendum Note (Signed)
Addended by: Nigel Mormon on: 05/16/2022 03:35 PM   Modules accepted: Orders

## 2022-06-14 ENCOUNTER — Ambulatory Visit: Payer: PPO | Admitting: Primary Care

## 2022-06-25 ENCOUNTER — Ambulatory Visit: Payer: PPO | Admitting: Primary Care

## 2022-07-02 ENCOUNTER — Encounter: Payer: Self-pay | Admitting: Primary Care

## 2022-07-02 ENCOUNTER — Telehealth: Payer: Self-pay | Admitting: Primary Care

## 2022-07-02 ENCOUNTER — Ambulatory Visit (INDEPENDENT_AMBULATORY_CARE_PROVIDER_SITE_OTHER): Payer: PPO | Admitting: Primary Care

## 2022-07-02 VITALS — BP 118/66 | HR 66 | Temp 99.0°F | Ht 62.5 in | Wt 141.4 lb

## 2022-07-02 DIAGNOSIS — R55 Syncope and collapse: Secondary | ICD-10-CM

## 2022-07-02 DIAGNOSIS — J3489 Other specified disorders of nose and nasal sinuses: Secondary | ICD-10-CM | POA: Diagnosis not present

## 2022-07-02 DIAGNOSIS — J4530 Mild persistent asthma, uncomplicated: Secondary | ICD-10-CM | POA: Diagnosis not present

## 2022-07-02 DIAGNOSIS — J45909 Unspecified asthma, uncomplicated: Secondary | ICD-10-CM | POA: Insufficient documentation

## 2022-07-02 LAB — BASIC METABOLIC PANEL
BUN: 26 mg/dL — ABNORMAL HIGH (ref 6–23)
CO2: 27 mEq/L (ref 19–32)
Calcium: 9.6 mg/dL (ref 8.4–10.5)
Chloride: 104 mEq/L (ref 96–112)
Creatinine, Ser: 1.12 mg/dL (ref 0.40–1.20)
GFR: 47.22 mL/min — ABNORMAL LOW (ref >60.00)
Glucose, Bld: 134 mg/dL — ABNORMAL HIGH (ref 70–99)
Potassium: 4.5 mEq/L (ref 3.5–5.1)
Sodium: 139 mEq/L (ref 135–145)

## 2022-07-02 LAB — CBC
HCT: 38.7 % (ref 36.0–46.0)
Hemoglobin: 13.3 g/dL (ref 12.0–15.0)
MCHC: 34.3 g/dL (ref 30.0–36.0)
MCV: 86.7 fl (ref 78.0–100.0)
Platelets: 197 10*3/uL (ref 150.0–400.0)
RBC: 4.47 Mil/uL (ref 3.87–5.11)
RDW: 12.9 % (ref 11.5–15.5)
WBC: 6.4 10*3/uL (ref 4.0–10.5)

## 2022-07-02 NOTE — Progress Notes (Signed)
$'@Patient'U$  ID: Brenda Franklin, female    DOB: 01/07/1946, 76 y.o.   MRN: [id number]  Chief Complaint  Patient presents with   Follow-up    Referring provider: Merrilee Seashore, MD  HPI: 76 year old female, current smoker (30 pack year hx). Past medical hx HTN, CAD, mixed hyperlipidemia.   Patient of Dr. Chase Franklin, seen for initial consult on 07/25/20 for dyspnea.HRCT showed no evidence of ILD, small air trapping and coronary artery calcifications. Echocardiogram in September 2021 showed normal LV systolic function with EF 55%, doppler evidence of grade 1 diastolic dysfunction. Mild mitral annular calcification, trace stenosis. Moderate grade 2 mitral regurgitation. Started on Spiriva respimat 1.23mg daily for suspected COPD. Follows with cardiology.   Previous [facility] pulmonary encounters: 06/15/2021 -  Dr. RChase Franklin     Chief Complaint  Patient presents with   Follow-up      Patient reports she is doing well with asthma.     RTonna Boehringer89y.o. -presents for follow-up.  Presents with her daughter MLeslye Peerwho works in [employer] underwriter  I personally saw her only 1 time last year.  Its been almost a year.  The daughter wishes only for once a year follow-up and as needed if something gets worse.  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  She was prescribed Symbicort.  Currently her ACT score is 21.  Occasionally she will get a panic attack or anxiety attack and she will take Symbicort or albuterol for rescue.  This is happened a few times and this seems to help.  However daughter says overall the Symbicort is helping with the shortness of breath.  Without the Symbicort patient can deteriorate.  Patient's mostly at home.  She does some activities of daily living such as driving to the grocery store and taking care of the dog and the dishes.  Otherwise is mostly sedentary.  No nocturnal awakenings.  No wheezing no  albuterol rescue use of significance.  No prednisone use no emergency visits.  No new diagnoses.   Asthma Control Test ACT Total Score  06/15/2021 21      07/02/2022- Interim hx  Patient presents today for fu asthma.  Accompanied by her daughter.  She is doing well today without acute respiratory complaints.  Her daughter does report that she had an episode this morning while in the shower where she felt lightheaded/faint.  She did not lose consciousness or fall.  Patient feels it was related to her not eating.  She had cereal around 10 AM after this event.  Her only complaint today is dry nasal passages which she states can interfere with her breathing through her nose.  She uses saline spray.  She is compliant with Symbicort 2 puffs twice daily. She has no breakthrough asthma symptoms.  She has not needed Albuterol rescue inhaler. She denies chest tightness, wheezing or cough.   Asthma: Respiratory symptoms- Dry nasal passages  Last exacerbation- None Maintenance medication- Symbicort and prn Albuterol  SABA use- None Prednisone use- None ACT score- 25   Allergies  Allergen Reactions   Tolmetin Nausea And Vomiting   Codeine Nausea And Vomiting and Other (See Comments)    Upset stomach   Lovastatin     Other reaction(s): myalgia   Milk-Related Compounds Diarrhea   Mirtazapine     Other reaction(s): loose stool, too sedating   Nsaids Diarrhea and Other (See Comments)  Stomach upset   Vilazodone Hcl     Other reaction(s): hallucination w '20mg'$  dose   Oxycodone Nausea And Vomiting    Immunization History  Administered Date(s) Administered   PFIZER(Purple Top)SARS-COV-2 Vaccination 11/29/2019, 12/30/2019   Rabies, IM 12/05/2015, 12/05/2015, 12/07/2015, 12/11/2015, 12/18/2015    Past Medical History:  Diagnosis Date   Asthma    on symbicort; rare albuterol use   BCC (basal cell carcinoma) 12/09/2012   right inner eye ([id number])   CAD (coronary artery disease)    Cataracts,  bilateral    Compression fracture of fifth lumbar vertebra (HCC)    Depression    Dysrhythmia    Extremity pain    High cholesterol     Tobacco History: Social History   Tobacco Use  Smoking Status Every Day   Packs/day: 0.50   Types: Cigarettes   Passive exposure: Never  Smokeless Tobacco Never   Ready to quit: Not Answered Counseling given: Not Answered   Outpatient Medications Prior to Visit  Medication Sig Dispense Refill   albuterol (VENTOLIN HFA) 108 (90 Base) MCG/ACT inhaler Inhale 1 puff into the lungs every 4 (four) hours as needed for shortness of breath or wheezing.     aspirin EC 81 MG tablet Take 1 tablet (81 mg total) by mouth as directed. Swallow whole. 2-3 times a week 90 tablet 3   b complex vitamins capsule Take 1 capsule by mouth daily.     bismuth subsalicylate (PEPTO BISMOL) 262 MG/15ML suspension Take 30 mLs by mouth every 6 (six) hours as needed for indigestion.     Carboxymethylcellul-Glycerin (LUBRICATING EYE DROPS OP) Place 1 drop into both eyes daily as needed (dry eyes).     Cholecalciferol (D-3-5) 125 MCG (5000 UT) capsule Take 5,000 Units by mouth daily.     citalopram (CELEXA) 10 MG tablet Take 10 mg by mouth daily.     ezetimibe (ZETIA) 10 MG tablet Take 1 tablet (10 mg total) by mouth daily. 90 tablet 3   lidocaine (LIDODERM) 5 % Place 1 patch onto the skin daily as needed (pain). Remove & Discard patch within 12 hours or as directed by MD     metoprolol tartrate (LOPRESSOR) 25 MG tablet Take 1 tablet (25 mg total) by mouth 2 (two) times daily. 180 tablet 3   Multiple Vitamin (MULTIVITAMIN) capsule Take 1 capsule by mouth daily.     nicotine (NICODERM CQ - DOSED IN MG/24 HR) 7 mg/24hr patch Place 1 patch (7 mg total) onto the skin daily. 28 patch 3   rosuvastatin (CRESTOR) 20 MG tablet Take 1 tablet (20 mg total) by mouth daily. 30 tablet 3   SYMBICORT 80-4.5 MCG/ACT inhaler Inhale 2 puffs into the lungs 2 (two) times daily. 30.6 g 2   traZODone  (DESYREL) 50 MG tablet Take 50 mg by mouth at bedtime as needed.     No facility-administered medications prior to visit.   Review of Systems  Review of Systems  Constitutional: Negative.   HENT: Negative.    Respiratory:  Negative for cough, chest tightness, shortness of breath and wheezing.    Physical Exam  BP 118/66 (BP Location: Left Arm, Patient Position: Sitting, Cuff Size: Normal)   Pulse 66   Temp 99 F (37.2 C) (Oral)   Ht 5' 2.5" (1.588 m)   Wt 141 lb 6.4 oz (64.1 kg)   SpO2 98%   BMI 25.45 kg/m  Physical Exam Constitutional:      General: She is not in  acute distress.    Appearance: Normal appearance. She is not ill-appearing.  HENT:     Head: Normocephalic and atraumatic.     Mouth/Throat:     Mouth: Mucous membranes are moist.     Pharynx: Oropharynx is clear.  Cardiovascular:     Rate and Rhythm: Normal rate and regular rhythm.  Pulmonary:     Effort: Pulmonary effort is normal.     Breath sounds: Normal breath sounds. No wheezing, rhonchi or rales.  Musculoskeletal:        General: Normal range of motion.  Skin:    General: Skin is warm and dry.  Neurological:     General: No focal deficit present.     Mental Status: She is alert and oriented to person, place, and time. Mental status is at baseline.  Psychiatric:        Mood and Affect: Mood normal.        Behavior: Behavior normal.        Thought Content: Thought content normal.        Judgment: Judgment normal.      Lab Results:  CBC    Component Value Date/Time   WBC 9.2 02/16/2022 0927   RBC 4.22 02/16/2022 0927   HGB 12.6 02/16/2022 0927   HCT 38.0 02/16/2022 0927   PLT 173 02/16/2022 0927   MCV 90.0 02/16/2022 0927   MCH 29.9 02/16/2022 0927   MCHC 33.2 02/16/2022 0927   RDW 12.5 02/16/2022 0927   LYMPHSABS 1.8 06/30/2013 1230   MONOABS 0.2 06/30/2013 1230   EOSABS 0.1 06/30/2013 1230   BASOSABS 0.0 06/30/2013 1230    BMET    Component Value Date/Time   NA 141 02/16/2022  0927   NA 141 05/01/2021 1324   K 3.9 02/16/2022 0927   CL 109 02/16/2022 0927   CO2 24 02/16/2022 0927   GLUCOSE 114 (H) 02/16/2022 0927   BUN 18 02/16/2022 0927   BUN 23 05/01/2021 1324   CREATININE 0.88 02/16/2022 0927   CALCIUM 9.1 02/16/2022 0927   GFRNONAA >60 02/16/2022 0927   GFRAA >90 06/30/2013 1230    BNP No results found for: "BNP"  ProBNP No results found for: "PROBNP"  Imaging: No results found.   Assessment & Plan:   Asthma - Stable; No acute respiratory symptoms or recent exacerbations. No SABA use. ACT 25.  - Continue Symbicort 160 two puffs twice daily and prn albuterol 2 puffs every 4-6 hours as needed for breakthrough shortness of breath/wheezing - Follow-up in 1 year or sooner if needed    Near syncope - Patient experienced an episode lightheadedness while in the shower this morning. No LOC or chest discomfort. Exam today was benign. Regular heart rate and rhythm. Ortho's were negative. We will check BMET and CBC. If happens again advised patient follow-up with PCP or cardiologist.   Nasal dryness - Advised patient try AYR nasal gel   Martyn Ehrich, NP 07/02/2022

## 2022-07-02 NOTE — Assessment & Plan Note (Signed)
-   Advised patient try AYR nasal gel

## 2022-07-02 NOTE — Patient Instructions (Signed)
Blood pressure did not drop while standing today. Heart rate was normal, regular rhythm  Try having snack before bedtime If happens again make a visit with PCP or cardiologist   Recommendations: - Continue Symbicort two puffs twice daily (I will as pharmacist if there are cheaper alternatives if needed in the future) - Use albuterol 2 puffs every 4-6 hours for shortness of breath or wheezing  - Get AYR nasal gel at any drug store over the counter (I did not have any samples today)  Orders: - Labs re: near syncope  Follow-up: 1 year with Dr. Chase Caller or sooner if needed

## 2022-07-02 NOTE — Assessment & Plan Note (Signed)
-   Patient experienced an episode lightheadedness while in the shower this morning. No LOC or chest discomfort. Exam today was benign. Regular heart rate and rhythm. Ortho's were negative. We will check BMET and CBC. If happens again advised patient follow-up with PCP or cardiologist.

## 2022-07-02 NOTE — Telephone Encounter (Signed)
What ICS/LABA is formulary on patient's plan?

## 2022-07-02 NOTE — Assessment & Plan Note (Signed)
-   Stable; No acute respiratory symptoms or recent exacerbations. No SABA use. ACT 25.  - Continue Symbicort 160 two puffs twice daily and prn albuterol 2 puffs every 4-6 hours as needed for breakthrough shortness of breath/wheezing - Follow-up in 1 year or sooner if needed

## 2022-07-03 ENCOUNTER — Other Ambulatory Visit (HOSPITAL_COMMUNITY): Payer: Self-pay

## 2022-07-03 NOTE — Telephone Encounter (Signed)
Per test claims the cheapest option so far for the patient is Advair diskus at a $15.00 co-pay.

## 2022-07-03 NOTE — Telephone Encounter (Signed)
Noted  

## 2022-07-04 NOTE — Progress Notes (Signed)
Please let patient know her labs were mostly normal, looks to be a little dehydrated. Push oral fluids. Follow up with PCP.

## 2022-07-29 ENCOUNTER — Telehealth: Payer: Self-pay | Admitting: Primary Care

## 2022-07-30 NOTE — Telephone Encounter (Signed)
Brenda Franklin, CPhT to Brenda Ehrich, NP      07/03/22 11:00 AM Note Per test claims the cheapest option so far for the patient is Advair diskus at a $15.00 co-pay.      Beth, please advise from the 10/3 encounter if you were okay switching pt to Advair.

## 2022-07-30 NOTE — Telephone Encounter (Signed)
Called and spoke with [name] about pt's inhalers and info per [place]. She said she was going to discuss this with pt and then call us back.

## 2022-07-30 NOTE — Telephone Encounter (Signed)
Yes we can send in prescription for Advair disc 250-21mg one puff twice daily. Discontinue Symbicort

## 2022-07-31 MED ORDER — SYMBICORT 80-4.5 MCG/ACT IN AERO: 2.0000 | INHALATION_SPRAY | Freq: Two times a day (BID) | RESPIRATORY_TRACT | 2 refills | Status: DC

## 2022-07-31 NOTE — Telephone Encounter (Signed)
Spoke with [name] (per [facility]) who states pt would rather stay with Symbicort. [name] did request refill be sent in which it was. Nothing further needed at this time.

## 2022-08-20 ENCOUNTER — Other Ambulatory Visit: Payer: Self-pay

## 2022-08-20 DIAGNOSIS — I719 Aortic aneurysm of unspecified site, without rupture: Secondary | ICD-10-CM

## 2022-08-25 ENCOUNTER — Other Ambulatory Visit: Payer: Self-pay | Admitting: Cardiology

## 2022-08-25 DIAGNOSIS — I2510 Atherosclerotic heart disease of native coronary artery without angina pectoris: Secondary | ICD-10-CM

## 2022-09-10 ENCOUNTER — Ambulatory Visit (HOSPITAL_COMMUNITY)
Admission: RE | Admit: 2022-09-10 | Discharge: 2022-09-10 | Disposition: A | Discharge status: Home / Self Care | Payer: PPO | Source: Ambulatory Visit | Attending: Gynecologic Oncology | Admitting: Gynecologic Oncology

## 2022-09-10 DIAGNOSIS — N83202 Unspecified ovarian cyst, left side: Secondary | ICD-10-CM | POA: Insufficient documentation

## 2022-09-10 DIAGNOSIS — Z90710 Acquired absence of both cervix and uterus: Secondary | ICD-10-CM | POA: Diagnosis not present

## 2022-09-10 DIAGNOSIS — Z780 Asymptomatic menopausal state: Secondary | ICD-10-CM | POA: Diagnosis not present

## 2022-09-11 ENCOUNTER — Inpatient Hospital Stay: Payer: PPO | Attending: Gynecologic Oncology | Admitting: Gynecologic Oncology

## 2022-09-11 ENCOUNTER — Encounter: Payer: Self-pay | Admitting: Gynecologic Oncology

## 2022-09-11 DIAGNOSIS — N83202 Unspecified ovarian cyst, left side: Secondary | ICD-10-CM | POA: Diagnosis not present

## 2022-09-11 NOTE — Progress Notes (Signed)
Gynecologic Oncology Telehealth Note: Gyn-Onc  I connected with Brenda Franklin on 09/11/22 at  4:00 PM EST by telephone and verified that I am speaking with the correct person using two identifiers.  I discussed the limitations, risks, security and privacy concerns of performing an evaluation and management service by telemedicine and the availability of in-person appointments. I also discussed with the patient that there may be a patient responsible charge related to this service. The patient expressed understanding and agreed to proceed.  Other persons participating in the visit and their role in the encounter: [name], patient's daughter.  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  This was noted to be 3.3 cm on previous imaging in 09/2020. CA-125 on 09/11/2021 was 5.3 [facility] shows left ovary measuring 4.3 x 3.6 x 4 cm with a cyst measuring up to 3.9 cm, possible hemorrhage inferiorly.  No free fluid. [facility] exam [facility] on 12/03/2021 shows left ovary measuring 4.9 x 4.2 x 4.8 cm with a cyst measuring up to 4 cm, notation of possible hemorrhage inferiorly.  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 Simple appearing cyst LEFT ovary 4.0 cm greatest diameter; Recommend follow up US in 3-6 months  Interval History: Doing well. Had some discomfort with pelvic ultrasound, otherwise denies pelvic symptoms.  Past Medical/Surgical History: Past Medical History:  Diagnosis Date   Asthma    on symbicort; rare albuterol use   BCC  (basal cell carcinoma) 12/09/2012   right inner eye (Cx35FU)   CAD (coronary artery disease)    Cataracts, bilateral    Compression fracture of fifth lumbar vertebra (HCC)    Depression    Dysrhythmia    Extremity pain    High cholesterol     Past Surgical History:  Procedure Laterality Date   ABDOMINAL HYSTERECTOMY     AUGMENTATION MAMMAPLASTY Bilateral    BREAST ENHANCEMENT SURGERY     COLONOSCOPY     ESOPHAGEAL DILATION     EXCISION VAGINAL CYST     HEMORRHOID SURGERY     iterstim implant removal  2016   MASS EXCISION Left 09/12/2021   Procedure: EXCISION MASS LEFT NECK MASS;  Surgeon: Leta Baptist, MD;  Location: [facility];  Service: ENT;  Laterality: Left;   metal stimulator for bowel control     Removed   NASAL SEPTOPLASTY W/ TURBINOPLASTY Bilateral 09/12/2021   Procedure: NASAL SEPTOPLASTY WITH TURBINATE REDUCTION;  Surgeon: Leta Baptist, MD;  Location: [facility];  Service: ENT;  Laterality: Bilateral;   NOSE SURGERY      Family History  Problem Relation Age of Onset   Other Mother        Intestinal problem - gangrene   Cancer Father        Bile Duct Cancer   Heart disease Father    Heart attack Father    Hypercholesterolemia Father    Bone cancer Sister    Bone cancer Brother    Colon cancer Neg Hx    Breast cancer Neg Hx    Ovarian cancer Neg Hx    Endometrial cancer Neg Hx  Pancreatic cancer Neg Hx    Prostate cancer Neg Hx     Social History   Socioeconomic History   Marital status: Divorced    Spouse name: Not on file   Number of children: 4   Years of education: 1 year of college   Highest education level: Not on file  Occupational History   Occupation: Retired  Tobacco Use   Smoking status: Every Day    Packs/day: 0.50    Types: Cigarettes    Passive exposure: Never   Smokeless tobacco: Never  Vaping Use   Vaping Use: Never used  Substance and Sexual Activity   Alcohol use: No   Drug use: No   Sexual activity: Not on file  Other Topics Concern    Not on file  Social History Narrative   Lives at home with her daughter.   Right-handed.   2-3 cups caffeine daily.   Social Determinants of Health   Financial Resource Strain: Not on file  Food Insecurity: Not on file  Transportation Needs: Not on file  Physical Activity: Not on file  Stress: Not on file  Social Connections: Not on file    Current Medications:  Current Outpatient Medications:    albuterol (VENTOLIN HFA) 108 (90 Base) MCG/ACT inhaler, Inhale 1 puff into the lungs every 4 (four) hours as needed for shortness of breath or wheezing., Disp: , Rfl:    aspirin EC 81 MG tablet, Take 1 tablet (81 mg total) by mouth as directed. Swallow whole. 2-3 times a week, Disp: 90 tablet, Rfl: 3   b complex vitamins capsule, Take 1 capsule by mouth daily., Disp: , Rfl:    bismuth subsalicylate (PEPTO BISMOL) 262 MG/15ML suspension, Take 30 mLs by mouth every 6 (six) hours as needed for indigestion., Disp: , Rfl:    Carboxymethylcellul-Glycerin (LUBRICATING EYE DROPS OP), Place 1 drop into both eyes daily as needed (dry eyes)., Disp: , Rfl:    Cholecalciferol (D-3-5) 125 MCG (5000 UT) capsule, Take 5,000 Units by mouth daily., Disp: , Rfl:    citalopram (CELEXA) 10 MG tablet, Take 10 mg by mouth daily., Disp: , Rfl:    ezetimibe (ZETIA) 10 MG tablet, Take 1 tablet (10 mg total) by mouth daily., Disp: 90 tablet, Rfl: 3   lidocaine (LIDODERM) 5 %, Place 1 patch onto the skin daily as needed (pain). Remove & Discard patch within 12 hours or as directed by MD, Disp: , Rfl:    metoprolol tartrate (LOPRESSOR) 25 MG tablet, Take 1 tablet (25 mg total) by mouth 2 (two) times daily., Disp: 180 tablet, Rfl: 3   Multiple Vitamin (MULTIVITAMIN) capsule, Take 1 capsule by mouth daily., Disp: , Rfl:    nicotine (NICODERM CQ - DOSED IN MG/24 HR) 7 mg/24hr patch, Place 1 patch (7 mg total) onto the skin daily., Disp: 28 patch, Rfl: 3   rosuvastatin (CRESTOR) 20 MG tablet, TAKE 1 TABLET(20 MG) BY MOUTH  DAILY, Disp: 30 tablet, Rfl: 3   SYMBICORT 80-4.5 MCG/ACT inhaler, Inhale 2 puffs into the lungs 2 (two) times daily., Disp: 30.6 g, Rfl: 2   traZODone (DESYREL) 50 MG tablet, Take 50 mg by mouth at bedtime as needed., Disp: , Rfl:   Review of Symptoms: Pertinent positives as per HPI.  Physical Exam: Deferred given limitations of phone visit.  Laboratory & Radiologic Studies: 09/10/22: IMPRESSION: Post hysterectomy and RIGHT oophorectomy.   Interval decrease in size of LEFT ovarian cyst; if not resected, imaging follow-up by ultrasound or  MR recommended for 2 years from initial exam to establish stability.   No new pelvic sonographic abnormalities.  Assessment & Plan: Brenda Franklin is a 76 y.o. woman with  simple adnexal mass.   Patient is unchanged in terms of the symptomatology from her visit with me.  We discussed ultrasound findings which show stable appearing cyst within the left ovary (stable to slightly decreased in size).  This has not grown since her last ultrasound and remains without any complex features which would raise the concern for malignancy.    We discussed option of no further surveillance imaging vs repeat ultrasound in 9-12 months. That would put [place] at close to 2 years of ultrasound monitoring, and if stable, I would suggest no further routine surveillance imaging. The patient and her daughter would like to plan on one additional ultrasound in 9-12 months.   They will call if Brenda Franklin develops any new and concerning symptoms before her next ultrasound.   I discussed the assessment and treatment plan with the patient. The patient was provided with an opportunity to ask questions and all were answered. The patient agreed with the plan and demonstrated an understanding of the instructions.   The patient was advised to call back or see an in-person evaluation if the symptoms worsen or if the condition fails to improve as anticipated.   8 minutes of total time was  spent for this patient encounter, including preparation, phone counseling with the patient and coordination of care, and documentation of the encounter.   Jeral Pinch, MD  Division of Gynecologic Oncology  Department of Obstetrics and Gynecology  [facility]

## 2022-09-11 NOTE — Progress Notes (Signed)
Order is in for an ultrasound for this patient in 9-12 months. Could someone call the daughter tomorrow, 12/14, to get this scheduled? Thank you

## 2022-09-12 ENCOUNTER — Telehealth: Payer: Self-pay | Admitting: *Deleted

## 2022-09-12 NOTE — Telephone Encounter (Signed)
Per Dr Berline Lopes patient scheduled for an [place] on October 7th at 2:30 pm. [facility] for the patient's daughter to call the office back. Daughter needs to be given the date/time of the appt

## 2022-09-12 NOTE — Progress Notes (Signed)
Appt scheduled

## 2022-09-13 NOTE — Telephone Encounter (Signed)
Daughter called back. Appointment date, time, and instructions given, per previous note and appointment notes.

## 2022-09-16 ENCOUNTER — Other Ambulatory Visit: Payer: Self-pay

## 2022-09-16 DIAGNOSIS — I719 Aortic aneurysm of unspecified site, without rupture: Secondary | ICD-10-CM

## 2022-09-17 ENCOUNTER — Ambulatory Visit: Payer: PPO | Admitting: Vascular Surgery

## 2022-09-24 ENCOUNTER — Telehealth: Payer: Self-pay

## 2022-09-24 NOTE — Patient Instructions (Signed)
Visit Information  Thank you for taking time to visit with me today. Please don't hesitate to contact me if I can be of assistance to you.   Following are the goals we discussed today:   Goals Addressed             This Visit's Progress    COMPLETED: Care Coordination Activites - no follow up required        Care Coordination Interventions: Active listening / Reflection utilized  Emotional Support Provided Problem [place] strategies reviewed Discussed/.Educated Care Coordination Program 2.   Discussed/.Educated Annual Wellness Visit 3.   Discussed/.Educated Social Determinates of Health 4.   Please inform PCP if services needed in the future          If you are experiencing a Mental Health or [place] or need someone to talk to, please call 1-800-273-TALK (toll free, 24 hour hotline)   The patient verbalized understanding of instructions, educational materials, and care plan provided today and agreed to receive a mailed copy of patient instructions, educational materials, and care plan.   No further follow up required   Lazaro Arms RN, BSN, [facility]   Phone: [phone]

## 2022-09-24 NOTE — Patient Outreach (Signed)
  Care Coordination   Initial Visit Note   09/24/2022 Name: DESTYNI HOPPEL MRN: [id number] DOB: 05/10/1946  SUMMAR MCGLOTHLIN is a 76 y.o. year old female who sees Merrilee Seashore, MD for primary care. I spoke with  Tretha Sciara by phone today.  What matters to the patients health and wellness today?  I spoke with the patient and her daughter.  She has no concerns about her health at this time.     Goals Addressed             This Visit's Progress    COMPLETED: Care Coordination Activites - no follow up required        Care Coordination Interventions: Active listening / Reflection utilized  Emotional Support Provided Problem [place] strategies reviewed Discussed/.Educated Care Coordination Program 2.   Discussed/.Educated Annual Wellness Visit 3.   Discussed/.Educated Social Determinates of Health 4.   Please inform PCP if services needed in the future         SDOH assessments and interventions completed:  Yes  SDOH Interventions Today    Flowsheet Row Most Recent Value  SDOH Interventions   Food Insecurity Interventions Intervention Not Indicated  Transportation Interventions Intervention Not Indicated        Care Coordination Interventions:  Yes, provided   Follow up plan: No further intervention required.   Encounter Outcome:  Pt. Visit Completed   Lazaro Arms RN, BSN, [facility]   Phone: [phone]

## 2022-09-26 ENCOUNTER — Other Ambulatory Visit: Payer: PPO

## 2022-10-08 ENCOUNTER — Telehealth: Payer: Self-pay

## 2022-10-08 NOTE — Patient Outreach (Signed)
  Care Coordination   Follow Up Visit Note   10/08/2022 Name: HALA NARULA MRN: [id number] DOB: October 14, 1945  LAPORSHA GREALISH is a 77 y.o. year old female who sees Merrilee Seashore, MD for primary care. I  received a call from Ruston the patient's daughter  What matters to the patients health and wellness today?  I am running out of medication early    Goals Addressed             This Visit's Progress    I am running out of my medication early       Care Coordination Interventions: Active listening / Reflection utilized  Problem Solving [place] reviewed I received a call from [facility], Ryiah Bellissimo' daughter. She mentioned that her mother is running out of her trazodone 50 mg medication earlier than expected, about seven days before the refill date. Mrs. Rowand is taking one pill to fall asleep; after about 3 hours, she awakens and then takes another. [name] is curious if the physician would recommend a higher dosage or increase the number of pills to help her mother sleep if one pill is not enough.  RNCM called the office today and left a message with the triage line. They stated they would call me back. I did call [name] and left a message to confirm  that I received her message and called the office.         SDOH assessments and interventions completed:  No     Care Coordination Interventions:  Yes, provided   Follow up plan: 10/11/22  130 pm  Encounter Outcome:  Pt. Visit Completed   Lazaro Arms RN, BSN, [facility]   Phone: [phone]

## 2022-10-11 ENCOUNTER — Ambulatory Visit: Payer: Self-pay

## 2022-10-11 NOTE — Patient Outreach (Signed)
  Care Coordination   Follow Up Visit Note   10/11/2022 Name: Brenda Franklin MRN: [id number] DOB: 31-May-1946  Brenda Franklin is a 77 y.o. year old female who sees Merrilee Seashore, MD for primary care. I  I spoke with Brenda Franklin.  What matters to the patients health and wellness today?  I have not received a call.    Goals Addressed             This Visit's Progress    COMPLETED: I am running out of my medication early       Care Coordination Interventions: Active listening / Reflection utilized  Problem Solving [place] reviewed I received a call from [facility], Kateleen Encarnacion' daughter. She mentioned that her mother is running out of her trazodone 50 mg medication earlier than expected, about seven days before the refill date. Mrs. Murdoch is taking one pill to fall asleep; after about 3 hours, she awakens and then takes another. Brenda Franklin is curious if the physician would recommend a higher dosage or increase the number of pills to help her mother sleep if one pill is not enough.  RNCM has followed up with Brenda Franklin to ask if she had received any updates from the physician's office. According to her, she hadn't. I informed her that the office had called and left a message, which may have been left on her mother's phone. I advised her to schedule an appointment to come in. Brenda Franklin appreciated my follow-up and agreed to call and make an appointment.        SDOH assessments and interventions completed:  No     Care Coordination Interventions:  Yes, provided   Follow up plan: No further intervention required.   Encounter Outcome:  Pt. Visit Completed   Lazaro Arms RN, BSN, [facility]   Phone: [phone]

## 2022-10-11 NOTE — Patient Instructions (Signed)
Visit Information  Thank you for taking time to visit with me today. Please don't hesitate to contact me if I can be of assistance to you.   Following are the goals we discussed today:   Goals Addressed             This Visit's Progress    COMPLETED: I am running out of my medication early       Care Coordination Interventions: Active listening / Reflection utilized  Problem Solving [place] reviewed I received a call from [facility], Nelli Swalley' daughter. She mentioned that her mother is running out of her trazodone 50 mg medication earlier than expected, about seven days before the refill date. Mrs. Polo is taking one pill to fall asleep; after about 3 hours, she awakens and then takes another. [name] is curious if the physician would recommend a higher dosage or increase the number of pills to help her mother sleep if one pill is not enough.  RNCM has followed up with [name] to ask if she had received any updates from the physician's office. According to her, she hadn't. I informed her that the office had called and left a message, which may have been left on her mother's phone. I advised her to schedule an appointment to come in. [name] appreciated my follow-up and agreed to call and make an appointment.        If you are experiencing a Mental Health or [place] or need someone to talk to, please call 1-800-273-TALK (toll free, 24 hour hotline)  The patient verbalized understanding of instructions, educational materials, and care plan provided today and agreed to receive a mailed copy of patient instructions, educational materials, and care plan.   No further follow up required   Lazaro Arms RN, BSN, [facility]   Phone: [phone]

## 2022-10-16 ENCOUNTER — Telehealth: Payer: Self-pay | Admitting: Primary Care

## 2022-10-16 NOTE — Telephone Encounter (Signed)
Called and spoke with patient's daughter Jenny Reichmann, [place]), she advised that the insurance company would be reaching out to [place] regarding her mom's Symbicort needing a PA.  Advised that I would send to our pharmacy team to follow up on.  Routing to [place] to follow up with patient after pharmacy gets a decision.  Pharmacy Team, please advise.  Thank you.

## 2022-10-17 ENCOUNTER — Other Ambulatory Visit (HOSPITAL_COMMUNITY): Payer: Self-pay

## 2022-10-17 ENCOUNTER — Telehealth: Payer: Self-pay

## 2022-10-17 NOTE — Telephone Encounter (Signed)
PA request received from providers office for Budesonide-Formoterol Fumarate 80-4.5MCG/ACT aerosol  PA has been submitted through [facility] to [employer] and has been APPROVED from 10/17/2022-09/30/2023.  Key: [id number]

## 2022-10-18 ENCOUNTER — Ambulatory Visit
Admission: RE | Admit: 2022-10-18 | Discharge: 2022-10-18 | Disposition: A | Discharge status: Home / Self Care | Payer: PPO | Source: Ambulatory Visit | Attending: Vascular Surgery | Admitting: Vascular Surgery

## 2022-10-18 DIAGNOSIS — N83292 Other ovarian cyst, left side: Secondary | ICD-10-CM | POA: Diagnosis not present

## 2022-10-18 DIAGNOSIS — I714 Abdominal aortic aneurysm, without rupture, unspecified: Secondary | ICD-10-CM | POA: Diagnosis not present

## 2022-10-18 DIAGNOSIS — I719 Aortic aneurysm of unspecified site, without rupture: Secondary | ICD-10-CM

## 2022-10-18 MED ORDER — IOPAMIDOL (ISOVUE-370) INJECTION 76%
115.0000 mL | Freq: Once | INTRAVENOUS | Status: AC | PRN
  Administered 2022-10-18: 115 mL via INTRAVENOUS

## 2022-10-29 ENCOUNTER — Ambulatory Visit (INDEPENDENT_AMBULATORY_CARE_PROVIDER_SITE_OTHER): Payer: PPO | Admitting: Vascular Surgery

## 2022-10-29 ENCOUNTER — Encounter: Payer: Self-pay | Admitting: Vascular Surgery

## 2022-10-29 VITALS — BP 147/62 | HR 56 | Temp 98.0°F | Resp 14 | Ht 64.0 in | Wt 140.0 lb

## 2022-10-29 DIAGNOSIS — I719 Aortic aneurysm of unspecified site, without rupture: Secondary | ICD-10-CM | POA: Diagnosis not present

## 2022-10-29 NOTE — Progress Notes (Signed)
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  She was previously being worked up for shortness of breath by her cardiologist Dr. Virgina Jock and had a CT chest on 08/10/2020 where there was concern for a dissection flap in the infrarenal abdominal aorta.  Further work-up with a CTA on 10/03/2020 showed a PAU arising from the left lateral wall of the abdominal aorta with a maximal diameter of 2.4 cm.    No new issues since her last visit with our practice.  Still smoking.  Past Medical History:  Diagnosis Date   Asthma    on symbicort; rare albuterol use   BCC (basal cell carcinoma) 12/09/2012   right inner eye ([id number])   CAD (coronary artery disease)    Cataracts, bilateral    Compression fracture of fifth lumbar vertebra (HCC)    Depression    Dysrhythmia    Extremity pain    High cholesterol     Past Surgical History:  Procedure Laterality Date   ABDOMINAL HYSTERECTOMY     AUGMENTATION MAMMAPLASTY Bilateral    BREAST ENHANCEMENT SURGERY     COLONOSCOPY     ESOPHAGEAL DILATION     EXCISION VAGINAL CYST     HEMORRHOID SURGERY     iterstim implant removal  2016   MASS EXCISION Left 09/12/2021   Procedure: EXCISION MASS LEFT NECK MASS;  Surgeon: Leta Baptist, MD;  Location: [facility] OR;  Service: ENT;  Laterality: Left;   metal stimulator for bowel control     Removed   NASAL SEPTOPLASTY W/ TURBINOPLASTY Bilateral 09/12/2021   Procedure: NASAL SEPTOPLASTY WITH TURBINATE REDUCTION;  Surgeon: Leta Baptist, MD;  Location: [facility] OR;  Service: ENT;  Laterality: Bilateral;   NOSE SURGERY      Family History  Problem Relation Age of Onset   Other Mother        Intestinal problem - gangrene   Cancer Father        Bile Duct Cancer    Heart disease Father    Heart attack Father    Hypercholesterolemia Father    Bone cancer Sister    Bone cancer Brother    Colon cancer Neg Hx    Breast cancer Neg Hx    Ovarian cancer Neg Hx    Endometrial cancer Neg Hx    Pancreatic cancer Neg Hx    Prostate cancer Neg Hx     SOCIAL HISTORY: Social History   Socioeconomic History   Marital status: Divorced    Spouse name: Not on file   Number of children: 4   Years of education: 1 year of college   Highest education level: Not on file  Occupational History   Occupation: Retired  Tobacco Use   Smoking status: Every Day    Packs/day: 0.50    Types: Cigarettes    Passive exposure: Never   Smokeless tobacco: Never  Vaping Use   Vaping Use: Never used  Substance and Sexual Activity   Alcohol use: No   Drug use: No   Sexual activity: Not on file  Other Topics Concern   Not on file  Social History Narrative   Lives at home with her daughter.   Right-handed.   2-3 cups caffeine daily.  Social Determinants of Health   Financial Resource Strain: Not on file  Food Insecurity: Not on file  Transportation Needs: Not on file  Physical Activity: Not on file  Stress: Not on file  Social Connections: Not on file  Intimate Partner Violence: Not on file    Allergies  Allergen Reactions   Tolmetin Nausea And Vomiting   Codeine Nausea And Vomiting and Other (See Comments)    Upset stomach   Lovastatin     Other reaction(s): myalgia   Milk-Related Compounds Diarrhea   Mirtazapine     Other reaction(s): loose stool, too sedating   Nsaids Diarrhea and Other (See Comments)    Stomach upset   Vilazodone Hcl     Other reaction(s): hallucination w '20mg'$  dose   Oxycodone Nausea And Vomiting    Current Outpatient Medications  Medication Sig Dispense Refill   albuterol (VENTOLIN HFA) 108 (90 Base) MCG/ACT inhaler Inhale 1 puff into the lungs every 4 (four) hours as needed for shortness of breath or wheezing.      aspirin EC 81 MG tablet Take 1 tablet (81 mg total) by mouth as directed. Swallow whole. 2-3 times a week 90 tablet 3   b complex vitamins capsule Take 1 capsule by mouth daily.     bismuth subsalicylate (PEPTO BISMOL) 262 MG/15ML suspension Take 30 mLs by mouth every 6 (six) hours as needed for indigestion.     Carboxymethylcellul-Glycerin (LUBRICATING EYE DROPS OP) Place 1 drop into both eyes daily as needed (dry eyes).     Cholecalciferol (D-3-5) 125 MCG (5000 UT) capsule Take 5,000 Units by mouth daily.     citalopram (CELEXA) 10 MG tablet Take 10 mg by mouth daily.     ezetimibe (ZETIA) 10 MG tablet Take 1 tablet (10 mg total) by mouth daily. 90 tablet 3   lidocaine (LIDODERM) 5 % Place 1 patch onto the skin daily as needed (pain). Remove & Discard patch within 12 hours or as directed by MD     metoprolol tartrate (LOPRESSOR) 25 MG tablet Take 1 tablet (25 mg total) by mouth 2 (two) times daily. 180 tablet 3   Multiple Vitamin (MULTIVITAMIN) capsule Take 1 capsule by mouth daily.     rosuvastatin (CRESTOR) 20 MG tablet TAKE 1 TABLET(20 MG) BY MOUTH DAILY 30 tablet 3   SYMBICORT 80-4.5 MCG/ACT inhaler Inhale 2 puffs into the lungs 2 (two) times daily. 30.6 g 2   traZODone (DESYREL) 50 MG tablet Take 50 mg by mouth at bedtime as needed.     nicotine (NICODERM CQ - DOSED IN MG/24 HR) 7 mg/24hr patch Place 1 patch (7 mg total) onto the skin daily. (Patient not taking: Reported on 10/29/2022) 28 patch 3   No current facility-administered medications for this visit.    REVIEW OF SYSTEMS:  '[X]'$  denotes positive finding, '[ ]'$  denotes negative finding Cardiac  Comments:  Chest pain or chest pressure:    Shortness of breath upon exertion:    Short of breath when lying flat:    Irregular heart rhythm:        Vascular    Pain in calf, thigh, or hip brought on by ambulation:    Pain in feet at night that wakes you up from your sleep:     Blood clot in your veins:    Leg swelling:          Pulmonary    Oxygen at home:    Productive cough:     Wheezing:  Neurologic    Sudden weakness in arms or legs:     Sudden numbness in arms or legs:     Sudden onset of difficulty speaking or slurred speech:    Temporary loss of vision in one eye:     Problems with dizziness:         Gastrointestinal    Blood in stool:     Vomited blood:         Genitourinary    Burning when urinating:     Blood in urine:        Psychiatric    Major depression:         Hematologic    Bleeding problems:    Problems with blood clotting too easily:        Skin    Rashes or ulcers:        Constitutional    Fever or chills:      PHYSICAL EXAM: Vitals:   10/29/22 1443  BP: (!) 147/62  Pulse: (!) 56  Resp: 14  Temp: 98 F (36.7 C)  TempSrc: Temporal  SpO2: 93%  Weight: 140 lb (63.5 kg)  Height: '5\' 4"'$  (1.626 m)    GENERAL: The patient is a well-nourished female, in no acute distress. The vital signs are documented above. CARDIAC: There is a regular rate and rhythm.  VASCULAR:  Palpable femoral pulses bilaterally PULMONARY: No respiratory distress. ABDOMEN: Soft and non-tender.  No pain with deep palpation. MUSCULOSKELETAL: There are no major deformities or cyanosis. NEUROLOGIC: No focal weakness or paresthesias are detected. SKIN: There are no ulcers or rashes noted. PSYCHIATRIC: The patient has a normal affect.  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  I discussed there has been little change over the last year and I measure this at 2.5 cm and it last measured 2.4 cm last year.  She has no flow limitation distally with palpable pulses.  I discussed I will  see her again in 1 year with AAA duplex to see if we can evaluate this with ultrasound.  Discussed no indications for surgical intervention at this time.   Marty Heck, MD [facility] Office: [phone]

## 2022-10-31 DIAGNOSIS — E782 Mixed hyperlipidemia: Secondary | ICD-10-CM | POA: Diagnosis not present

## 2022-10-31 DIAGNOSIS — I10 Essential (primary) hypertension: Secondary | ICD-10-CM | POA: Diagnosis not present

## 2022-10-31 DIAGNOSIS — N1831 Chronic kidney disease, stage 3a: Secondary | ICD-10-CM | POA: Diagnosis not present

## 2022-11-01 ENCOUNTER — Other Ambulatory Visit: Payer: Self-pay | Admitting: Cardiology

## 2022-11-01 DIAGNOSIS — E782 Mixed hyperlipidemia: Secondary | ICD-10-CM

## 2022-11-06 ENCOUNTER — Telehealth: Payer: Self-pay

## 2022-11-06 ENCOUNTER — Encounter: Payer: Self-pay | Admitting: Cardiology

## 2022-11-06 ENCOUNTER — Ambulatory Visit: Payer: PPO | Admitting: Cardiology

## 2022-11-06 VITALS — BP 144/66 | HR 60 | Resp 16 | Ht 64.0 in | Wt 141.0 lb

## 2022-11-06 DIAGNOSIS — I739 Peripheral vascular disease, unspecified: Secondary | ICD-10-CM | POA: Insufficient documentation

## 2022-11-06 DIAGNOSIS — E782 Mixed hyperlipidemia: Secondary | ICD-10-CM

## 2022-11-06 DIAGNOSIS — I719 Aortic aneurysm of unspecified site, without rupture: Secondary | ICD-10-CM | POA: Diagnosis not present

## 2022-11-06 DIAGNOSIS — I2510 Atherosclerotic heart disease of native coronary artery without angina pectoris: Secondary | ICD-10-CM | POA: Diagnosis not present

## 2022-11-06 NOTE — Patient Outreach (Signed)
  Care Coordination   Follow Up Visit Note   11/06/2022 Name: KAILIE POLUS MRN: [id number] DOB: Oct 21, 1945  KENIAH KLEMMER is a 77 y.o. year old female who sees Merrilee Seashore, MD for primary care.   What matters to the patients health and wellness today?  Patient's daughter [name] provided all information during this encounter.. See below for interventions and patient self-care actives.  I received a call from Merlene Laughter regarding her mother, Icyss Skog. She called on Monday and requested that her mother's lab work from last week be sent to her cardiologist, Dr. Waunita Schooner, for her appointment at 2 pm today. [name] has not yet received a response from the office. She asked me to call and inquire about the labs, whether they had been faxed or if she could pick them up from the office. Initially, I spoke with Kieth Brightly but later called back and spoke with Izora Gala, who is Dr. Mathis Fare CMA. Izora Gala confirmed that the labs would be faxed. I then called [name] and informed her of the update. She expressed her appreciation for the assistance.   SDOH assessments and interventions completed:  No     Care Coordination Interventions:  Yes, provided   Interventions Today    Flowsheet Row Most Recent Value  General Interventions   General Interventions Discussed/Reviewed General Interventions Discussed, Labs       Follow up plan: No further intervention required.   Encounter Outcome:  Pt. Visit Completed   Lazaro Arms RN, BSN, [facility]   Phone: [phone]

## 2022-11-06 NOTE — Progress Notes (Signed)
Patient referred by Merrilee Seashore, MD for back pain, possible angina equivalent  Subjective:   Brenda Franklin, female    DOB: 12/23/1945, 77 y.o.   MRN: [id number]   Chief Complaint  Patient presents with   Coronary Artery Disease   Follow-up    6 month    HPI  78 y.o. Caucasian female with hypertension, hyperlipidemia, tobacco dependence, coronary artery disease, penetrating ulcer of infrarenal abdominal aorta  Patient is here with her daughter.  She is doing fairly well, denies chest pain, shortness of breath, palpitations, leg edema, orthopnea, PND, TIA/syncope. Reviewed recent test results with the patient, details below.    Dr. Carlis Abbott (10/29/2022): I discussed there has been little change over the last year and I measure this at 2.5 cm and it last measured 2.4 cm last year.  She has no flow limitation distally with palpable pulses.  I discussed I will see her again in 1 year with AAA duplex to see if we can evaluate this with ultrasound.  Discussed no indications for surgical intervention at this time.     Current Outpatient Medications:    albuterol (VENTOLIN HFA) 108 (90 Base) MCG/ACT inhaler, Inhale 1 puff into the lungs every 4 (four) hours as needed for shortness of breath or wheezing., Disp: , Rfl:    aspirin EC 81 MG tablet, Take 1 tablet (81 mg total) by mouth as directed. Swallow whole. 2-3 times a week, Disp: 90 tablet, Rfl: 3   b complex vitamins capsule, Take 1 capsule by mouth daily., Disp: , Rfl:    bismuth subsalicylate (PEPTO BISMOL) 262 MG/15ML suspension, Take 30 mLs by mouth every 6 (six) hours as needed for indigestion., Disp: , Rfl:    Carboxymethylcellul-Glycerin (LUBRICATING EYE DROPS OP), Place 1 drop into both eyes daily as needed (dry eyes)., Disp: , Rfl:    Cholecalciferol (D-3-5) 125 MCG (5000 UT) capsule, Take 5,000 Units by mouth daily., Disp: , Rfl:    citalopram (CELEXA) 10 MG tablet, Take 10 mg by mouth daily., Disp: , Rfl:    ezetimibe  (ZETIA) 10 MG tablet, TAKE 1 TABLET(10 MG) BY MOUTH DAILY, Disp: 90 tablet, Rfl: 3   KRILL OIL PO, Take by mouth., Disp: , Rfl:    lidocaine (LIDODERM) 5 %, Place 1 patch onto the skin daily as needed (pain). Remove & Discard patch within 12 hours or as directed by MD, Disp: , Rfl:    metoprolol tartrate (LOPRESSOR) 25 MG tablet, Take 1 tablet (25 mg total) by mouth 2 (two) times daily., Disp: 180 tablet, Rfl: 3   Multiple Vitamin (MULTIVITAMIN) capsule, Take 1 capsule by mouth daily., Disp: , Rfl:    rosuvastatin (CRESTOR) 20 MG tablet, TAKE 1 TABLET(20 MG) BY MOUTH DAILY, Disp: 30 tablet, Rfl: 3   SYMBICORT 80-4.5 MCG/ACT inhaler, Inhale 2 puffs into the lungs 2 (two) times daily., Disp: 30.6 g, Rfl: 2   traZODone (DESYREL) 50 MG tablet, Take 50 mg by mouth at bedtime as needed., Disp: , Rfl:    Ubiquinol 50 MG CAPS, Take by mouth., Disp: , Rfl:    nicotine (NICODERM CQ - DOSED IN MG/24 HR) 7 mg/24hr patch, Place 1 patch (7 mg total) onto the skin daily. (Patient not taking: Reported on 11/06/2022), Disp: 28 patch, Rfl: 3  Cardiovascular and other pertinent studies:  EKG 11/06/2022: Sinus rhythm 60 bpm bordelrine left atrial enlargement  CTA abdomen pelvis 10/18/2022: VASCULAR   1. Slowly enlarging penetrating atherosclerotic ulceration arising from the  distal infrarenal abdominal aorta. The maximal aortic diameter is 2.7 cm today compared to 2.4 cm in October of 2022. Patient is already under the care of a vascular specialist. This recommendation follows ACR consensus guidelines: White Paper of the ACR Incidental Findings Committee II on Vascular Findings. J Am Coll Radiol 2013; 10:789-794.   NON-VASCULAR   1. Enlarging but simple appearing cystic lesion arising from the left ovary. Given the significant enlargement (volume increased by approximately 360%) compared to prior imaging from January of 2022, referral to gyn oncology is recommended to assess for benign or malignant serous  ovarian neoplasm. 2. Colonic diverticular disease without CT evidence of active inflammation. 3. Stable T12 and L4 compression fractures. 4. Additional ancillary findings as above.  Echocardiogram 06/20/2020:  Left ventricle cavity is normal in size and wall thickness. Normal global  wall motion. Normal LV systolic function with EF 55%. Doppler evidence of  grade I (impaired) diastolic dysfunction, normal LAP. Calculated EF 55%.  Trileaflet aortic valve.  Trace aortic regurgitation.  Mild mitral annular calcification. Trace mitral stenosis. Moderate (Grade  II) mitral regurgitation.  Inadequate TR jet to estimate pulmonary artery systolic pressure. Normal  right atrial pressure.   ABI 06/19/2020:  This exam reveals mildly decreased perfusion of the right lower extremity,  noted at the anterior tibial and post tibial artery level (ABI 0.88) and  mildly decreased perfusion of the left lower extremity, noted at the post  tibial artery level (ABI 0.88).  Mildly abnormal biphasic waveforms at the  bilateral AT.   CT Cardiac scoring 06/01/2020: Total score: 867 LM: 94 LAD: 501 LCx: 0 RCA: 272  EKG 05/25/2020: Sinus rhythm 62 bpm Normal EKG  Recent labs: 10/16/2022: Chol 184, TG 97, HDL 73, LDL 92  07/02/2022: Glucose 134, BUN/Cr 26/1.12. EGFR 47. Na/K 139/4.5.  H/H 13/38. MCV 86. Platelets 197  11/2020: Chol 194, TG 158, HDL 68, LDL 99  02/16/2022: Glucose 114, BUN/Cr 18/0.88. EGFR >60. Na/K 141/3.9.  H/H 12/38. MCV 90. Platelets 173   Review of Systems  Cardiovascular:  Negative for chest pain, dyspnea on exertion, leg swelling, palpitations and syncope.  Musculoskeletal:  Positive for back pain.         Vitals:   11/06/22 1418  BP: (!) 144/66  Pulse: 60  Resp: 16  SpO2: 96%     Body mass index is 24.2 kg/m. Filed Weights   11/06/22 1418  Weight: 141 lb (64 kg)     Objective:   Physical Exam Vitals and nursing note reviewed.  Constitutional:       General: She is not in acute distress. Neck:     Vascular: No JVD.  Cardiovascular:     Rate and Rhythm: Normal rate and regular rhythm.     Pulses:          Dorsalis pedis pulses are 0 on the right side and 0 on the left side.       Posterior tibial pulses are 1+ on the right side and 1+ on the left side.     Heart sounds: Normal heart sounds. No murmur heard. Pulmonary:     Effort: Pulmonary effort is normal.     Breath sounds: Normal breath sounds. No wheezing or rales.  Musculoskeletal:     Right lower leg: No edema.     Left lower leg: No edema.         Assessment & Recommendations:   77 y.o. Caucasian female with hypertension, hyperlipidemia, tobacco dependence, coronary artery  disease, penetrating ulcer of infrarenal abdominal aorta  Penetrating ulcer of aorta: Stable, has annual follow-up with vascular surgery. Seen by Dr. Carlis Abbott (09/2022), recommended aorta duplex in 1 year.  CAD: Elevated calcium score and mildly abnormal stress test.  No overt angina/angina equivalent symptom. Recommend Aspirin 81 mg 2-3 days a week. (Does not want to take it everyday due to GI intolerance). Continue metoprolol tartarate 25 mg bid, Crestor 20 mg daiy, Zetia 10 mg daily. Chol 184, TG 97, HDL 73, LDL 92 (09/2022).   Hypertension: Recommend regular checks at home. No change made today, at least partially attributed to back pain.  F/u in 6 months    Nigel Mormon, MD Pager: [phone] Office: [phone]

## 2022-11-06 NOTE — Telephone Encounter (Signed)
See encounter from 1/18.

## 2022-11-11 DIAGNOSIS — M810 Age-related osteoporosis without current pathological fracture: Secondary | ICD-10-CM | POA: Diagnosis not present

## 2022-11-11 DIAGNOSIS — F0390 Unspecified dementia without behavioral disturbance: Secondary | ICD-10-CM | POA: Diagnosis not present

## 2022-11-11 DIAGNOSIS — I10 Essential (primary) hypertension: Secondary | ICD-10-CM | POA: Diagnosis not present

## 2022-11-11 DIAGNOSIS — N1831 Chronic kidney disease, stage 3a: Secondary | ICD-10-CM | POA: Diagnosis not present

## 2022-11-11 DIAGNOSIS — E782 Mixed hyperlipidemia: Secondary | ICD-10-CM | POA: Diagnosis not present

## 2022-11-11 DIAGNOSIS — I714 Abdominal aortic aneurysm, without rupture, unspecified: Secondary | ICD-10-CM | POA: Diagnosis not present

## 2022-11-11 DIAGNOSIS — G4700 Insomnia, unspecified: Secondary | ICD-10-CM | POA: Diagnosis not present

## 2022-11-17 ENCOUNTER — Other Ambulatory Visit: Payer: Self-pay | Admitting: Cardiology

## 2022-11-17 DIAGNOSIS — I2510 Atherosclerotic heart disease of native coronary artery without angina pectoris: Secondary | ICD-10-CM

## 2022-11-20 ENCOUNTER — Other Ambulatory Visit: Payer: Self-pay | Admitting: Cardiology

## 2022-11-20 DIAGNOSIS — H04123 Dry eye syndrome of bilateral lacrimal glands: Secondary | ICD-10-CM | POA: Diagnosis not present

## 2022-11-20 DIAGNOSIS — H35033 Hypertensive retinopathy, bilateral: Secondary | ICD-10-CM | POA: Diagnosis not present

## 2022-11-20 DIAGNOSIS — H353131 Nonexudative age-related macular degeneration, bilateral, early dry stage: Secondary | ICD-10-CM | POA: Diagnosis not present

## 2022-11-20 DIAGNOSIS — I2510 Atherosclerotic heart disease of native coronary artery without angina pectoris: Secondary | ICD-10-CM

## 2022-11-22 ENCOUNTER — Telehealth: Payer: Self-pay

## 2022-11-22 NOTE — Patient Outreach (Addendum)
  Care Coordination   Patient Care Consultation  Visit Note   11/22/2022 Name: Brenda Franklin MRN: [id number] DOB: 02/26/1946  Brenda Franklin is a 77 y.o. year old female who sees Brenda Seashore, MD for primary care. I Received a call from [employer]  What matters to the patients health and wellness today?  I received a call from Monday Constantino regarding her mother, Brenda Franklin. She called the office to state that the insurance office requested some documentation be sent. They did not receive it. The insurance company requires a call now to approve the Lidocaine patches 5%, as she mentioned that she had called the office and left a message on the number to call ([phone]) and press option two; she has yet to receive any information back. I consulted with my co-partner, Brenda Billings, RN, and she will send the office communication about the matter because she is in their system. I informed Brenda Franklin.  Addendum: Brenda Franklin was called and notified of the response received:  Brenda Abraham, LPN [id number] AM >  I have called and also submitted PA on-line.  It was denied.  Rx is available otc at a slightly lower strength.  I have already  informed daughter.     SDOH assessments and interventions completed:  No     Care Coordination Interventions:  Yes, provided   Interventions Today    Flowsheet Row Most Recent Value  General Interventions   General Interventions Discussed/Reviewed Communication with, General Interventions Discussed  [Brenda Franklin]  Communication with RN  Pharmacy Interventions   Pharmacy Dicussed/Reviewed Medications and their functions       Follow up plan: No further intervention required.   Encounter Outcome:  Pt. Visit Completed   Lazaro Arms RN, BSN, [facility]   Phone: [phone]

## 2022-12-27 ENCOUNTER — Encounter (INDEPENDENT_AMBULATORY_CARE_PROVIDER_SITE_OTHER): Payer: PPO | Admitting: Ophthalmology

## 2023-01-03 ENCOUNTER — Ambulatory Visit: Payer: PPO | Admitting: Internal Medicine

## 2023-01-07 DIAGNOSIS — M79641 Pain in right hand: Secondary | ICD-10-CM | POA: Diagnosis not present

## 2023-01-07 DIAGNOSIS — M79642 Pain in left hand: Secondary | ICD-10-CM | POA: Diagnosis not present

## 2023-02-27 DIAGNOSIS — R197 Diarrhea, unspecified: Secondary | ICD-10-CM | POA: Diagnosis not present

## 2023-03-10 ENCOUNTER — Other Ambulatory Visit: Payer: Self-pay | Admitting: Cardiology

## 2023-03-10 DIAGNOSIS — I2510 Atherosclerotic heart disease of native coronary artery without angina pectoris: Secondary | ICD-10-CM

## 2023-03-13 ENCOUNTER — Other Ambulatory Visit: Payer: Self-pay | Admitting: *Deleted

## 2023-03-13 MED ORDER — SYMBICORT 80-4.5 MCG/ACT IN AERO
2.0000 | INHALATION_SPRAY | Freq: Two times a day (BID) | RESPIRATORY_TRACT | 2 refills | Status: DC
Start: 2023-03-13 — End: 2024-06-08

## 2023-03-18 DIAGNOSIS — N1831 Chronic kidney disease, stage 3a: Secondary | ICD-10-CM | POA: Diagnosis not present

## 2023-03-18 DIAGNOSIS — I714 Abdominal aortic aneurysm, without rupture, unspecified: Secondary | ICD-10-CM | POA: Diagnosis not present

## 2023-03-18 DIAGNOSIS — F0390 Unspecified dementia without behavioral disturbance: Secondary | ICD-10-CM | POA: Diagnosis not present

## 2023-03-18 DIAGNOSIS — E782 Mixed hyperlipidemia: Secondary | ICD-10-CM | POA: Diagnosis not present

## 2023-03-18 DIAGNOSIS — I10 Essential (primary) hypertension: Secondary | ICD-10-CM | POA: Diagnosis not present

## 2023-03-18 DIAGNOSIS — R197 Diarrhea, unspecified: Secondary | ICD-10-CM | POA: Diagnosis not present

## 2023-03-25 ENCOUNTER — Encounter: Payer: Self-pay | Admitting: Internal Medicine

## 2023-03-25 NOTE — Progress Notes (Unsigned)
Subjective:    Patient ID: Brenda Franklin, female    DOB: 09/28/1946, 77 y.o.   MRN: [id number]     HPI Brenda Franklin is here for follow up of her chronic medical problems.  She is here to establish with a new pcp.  She is here with her daughter Brenda Franklin who she lives with.  Assthma- midl Aoritc ulcer - vas   Bowel issues - saw piror pcpclast week.  Lots of gas, lowerarea narrow - sees GI.  Lost some weiht -- 6 weeks o dairrhea, moreBM that normal.  Occ incontinence - issues with control for yrs  Son died 2 yrs ago and sincethen memory has bee ngetting worse.  Medications and allergies reviewed with patient and updated if appropriate.  Current Outpatient Medications on File Prior to Visit  Medication Sig Dispense Refill   albuterol (VENTOLIN HFA) 108 (90 Base) MCG/ACT inhaler Inhale 1 puff into the lungs every 4 (four) hours as needed for shortness of breath or wheezing.     b complex vitamins capsule Take 1 capsule by mouth daily.     bismuth subsalicylate (PEPTO BISMOL) 262 MG/15ML suspension Take 30 mLs by mouth every 6 (six) hours as needed for indigestion.     Carboxymethylcellul-Glycerin (LUBRICATING EYE DROPS OP) Place 1 drop into both eyes daily as needed (dry eyes).     Cholecalciferol (D-3-5) 125 MCG (5000 UT) capsule Take 5,000 Units by mouth daily.     citalopram (CELEXA) 10 MG tablet Take 10 mg by mouth daily.     ezetimibe (ZETIA) 10 MG tablet TAKE 1 TABLET(10 MG) BY MOUTH DAILY 90 tablet 3   KRILL OIL PO Take by mouth.     lidocaine (LIDODERM) 5 % Place 1 patch onto the skin daily as needed (pain). Remove & Discard patch within 12 hours or as directed by MD     metoprolol tartrate (LOPRESSOR) 25 MG tablet TAKE 1 TABLET(25 MG) BY MOUTH TWICE DAILY 180 tablet 3   Multiple Vitamin (MULTIVITAMIN) capsule Take 1 capsule by mouth daily.     rosuvastatin (CRESTOR) 20 MG tablet TAKE 1 TABLET(20 MG) BY MOUTH DAILY 30 tablet 3   SYMBICORT 80-4.5 MCG/ACT inhaler Inhale 2 puffs into the  lungs 2 (two) times daily. 30.6 g 2   traZODone (DESYREL) 50 MG tablet Take 50 mg by mouth at bedtime as needed.     Ubiquinol 50 MG CAPS Take by mouth.     aspirin EC 81 MG tablet Take 1 tablet (81 mg total) by mouth as directed. Swallow whole. 2-3 times a week (Patient not taking: Reported on 03/26/2023) 90 tablet 3   nicotine (NICODERM CQ - DOSED IN MG/24 HR) 7 mg/24hr patch Place 1 patch (7 mg total) onto the skin daily. (Patient not taking: Reported on 11/06/2022) 28 patch 3   No current facility-administered medications on file prior to visit.     Review of Systems  Constitutional:  Negative for fever.  Respiratory:  Positive for shortness of breath (with strenuous activity). Negative for cough and wheezing.   Cardiovascular:  Negative for chest pain, palpitations and leg swelling.  Gastrointestinal:  Negative for abdominal pain and diarrhea (watery, loose, narrow).       No gerd  Musculoskeletal:  Positive for back pain (chronic - compression fx). Negative for arthralgias.  Neurological:  Positive for headaches (occ). Negative for light-headedness.  Psychiatric/Behavioral:  Positive for dysphoric mood and sleep disturbance. The patient is nervous/anxious.  Objective:   Vitals:   03/26/23 1109  BP: 114/60  Pulse: (!) 59  Temp: 98.4 F (36.9 C)  SpO2: 90%   BP Readings from Last 3 Encounters:  03/26/23 114/60  11/06/22 (!) 144/66  10/29/22 (!) 147/62   Wt Readings from Last 3 Encounters:  03/26/23 134 lb (60.8 kg)  11/06/22 141 lb (64 kg)  10/29/22 140 lb (63.5 kg)   Body mass index is 23 kg/m.    Physical Exam     Lab Results  Component Value Date   WBC 6.4 07/02/2022   HGB 13.3 07/02/2022   HCT 38.7 07/02/2022   PLT 197.0 07/02/2022   GLUCOSE 134 (H) 07/02/2022   CHOL 194 12/26/2020   TRIG 158 (H) 12/26/2020   HDL 68 12/26/2020   LDLCALC 99 12/26/2020   ALT 16 06/30/2013   AST 20 06/30/2013   NA 139 07/02/2022   K 4.5 07/02/2022   CL 104  07/02/2022   CREATININE 1.12 07/02/2022   BUN 26 (H) 07/02/2022   CO2 27 07/02/2022     Assessment & Plan:     Asthma: Chronic Following with pulmonary  Stable On Symbicort 162 puffs twice daily and albuterol as needed    See Problem List for Assessment and Plan of chronic medical problems.

## 2023-03-26 ENCOUNTER — Ambulatory Visit (INDEPENDENT_AMBULATORY_CARE_PROVIDER_SITE_OTHER): Payer: PPO | Admitting: Internal Medicine

## 2023-03-26 ENCOUNTER — Encounter: Payer: Self-pay | Admitting: Internal Medicine

## 2023-03-26 ENCOUNTER — Telehealth: Payer: Self-pay | Admitting: Radiology

## 2023-03-26 VITALS — BP 114/60 | HR 59 | Temp 98.4°F | Ht 64.0 in | Wt 134.0 lb

## 2023-03-26 DIAGNOSIS — F5104 Psychophysiologic insomnia: Secondary | ICD-10-CM

## 2023-03-26 DIAGNOSIS — J4530 Mild persistent asthma, uncomplicated: Secondary | ICD-10-CM | POA: Diagnosis not present

## 2023-03-26 DIAGNOSIS — I719 Aortic aneurysm of unspecified site, without rupture: Secondary | ICD-10-CM

## 2023-03-26 DIAGNOSIS — E782 Mixed hyperlipidemia: Secondary | ICD-10-CM

## 2023-03-26 DIAGNOSIS — I10 Essential (primary) hypertension: Secondary | ICD-10-CM

## 2023-03-26 DIAGNOSIS — R739 Hyperglycemia, unspecified: Secondary | ICD-10-CM | POA: Diagnosis not present

## 2023-03-26 DIAGNOSIS — I2510 Atherosclerotic heart disease of native coronary artery without angina pectoris: Secondary | ICD-10-CM

## 2023-03-26 DIAGNOSIS — G4700 Insomnia, unspecified: Secondary | ICD-10-CM | POA: Insufficient documentation

## 2023-03-26 DIAGNOSIS — R7303 Prediabetes: Secondary | ICD-10-CM | POA: Insufficient documentation

## 2023-03-26 DIAGNOSIS — S32009S Unspecified fracture of unspecified lumbar vertebra, sequela: Secondary | ICD-10-CM | POA: Diagnosis not present

## 2023-03-26 DIAGNOSIS — N83202 Unspecified ovarian cyst, left side: Secondary | ICD-10-CM

## 2023-03-26 DIAGNOSIS — F419 Anxiety disorder, unspecified: Secondary | ICD-10-CM | POA: Diagnosis not present

## 2023-03-26 DIAGNOSIS — S32009A Unspecified fracture of unspecified lumbar vertebra, initial encounter for closed fracture: Secondary | ICD-10-CM | POA: Insufficient documentation

## 2023-03-26 DIAGNOSIS — F32A Depression, unspecified: Secondary | ICD-10-CM | POA: Insufficient documentation

## 2023-03-26 LAB — CBC WITH DIFFERENTIAL/PLATELET
Basophils Absolute: 0 10*3/uL (ref 0.0–0.1)
Basophils Relative: 0.5 % (ref 0.0–3.0)
Eosinophils Absolute: 0.1 10*3/uL (ref 0.0–0.7)
Eosinophils Relative: 1.5 % (ref 0.0–5.0)
HCT: 40.5 % (ref 36.0–46.0)
Hemoglobin: 13.3 g/dL (ref 12.0–15.0)
Lymphocytes Relative: 40.6 % (ref 12.0–46.0)
Lymphs Abs: 2.7 10*3/uL (ref 0.7–4.0)
MCHC: 32.8 g/dL (ref 30.0–36.0)
MCV: 88.2 fl (ref 78.0–100.0)
Monocytes Absolute: 0.4 10*3/uL (ref 0.1–1.0)
Monocytes Relative: 5.4 % (ref 3.0–12.0)
Neutro Abs: 3.4 10*3/uL (ref 1.4–7.7)
Neutrophils Relative %: 52 % (ref 43.0–77.0)
Platelets: 181 10*3/uL (ref 150.0–400.0)
RBC: 4.59 Mil/uL (ref 3.87–5.11)
RDW: 13.1 % (ref 11.5–15.5)
WBC: 6.6 10*3/uL (ref 4.0–10.5)

## 2023-03-26 LAB — LIPID PANEL
Cholesterol: 180 mg/dL (ref 0–200)
HDL: 67 mg/dL (ref >39.00)
LDL Cholesterol: 83 mg/dL (ref 0–99)
NonHDL: 112.95
Total CHOL/HDL Ratio: 3
Triglycerides: 151 mg/dL — ABNORMAL HIGH (ref 0.0–149.0)
VLDL: 30.2 mg/dL (ref 0.0–40.0)

## 2023-03-26 LAB — COMPREHENSIVE METABOLIC PANEL
ALT: 18 U/L (ref 0–35)
AST: 20 U/L (ref 0–37)
Albumin: 4.4 g/dL (ref 3.5–5.2)
Alkaline Phosphatase: 67 U/L (ref 39–117)
BUN: 19 mg/dL (ref 6–23)
CO2: 29 mEq/L (ref 19–32)
Calcium: 9.6 mg/dL (ref 8.4–10.5)
Chloride: 103 mEq/L (ref 96–112)
Creatinine, Ser: 0.99 mg/dL (ref 0.40–1.20)
GFR: 54.47 mL/min — ABNORMAL LOW (ref >60.00)
Glucose, Bld: 85 mg/dL (ref 70–99)
Potassium: 4.3 mEq/L (ref 3.5–5.1)
Sodium: 140 mEq/L (ref 135–145)
Total Bilirubin: 0.7 mg/dL (ref 0.2–1.2)
Total Protein: 6.9 g/dL (ref 6.0–8.3)

## 2023-03-26 LAB — VITAMIN D 25 HYDROXY (VIT D DEFICIENCY, FRACTURES): VITD: 108.4 ng/mL (ref 30.00–100.00)

## 2023-03-26 LAB — HEMOGLOBIN A1C: Hgb A1c MFr Bld: 6.1 % (ref 4.6–6.5)

## 2023-03-26 NOTE — Patient Instructions (Addendum)
     It was nice to meet you.    Blood work was ordered.   The lab is on the first floor.    Medications changes include :   none      Return in about 6 months (around 09/25/2023) for follow up.

## 2023-03-26 NOTE — Assessment & Plan Note (Addendum)
History of lumbar compression fracture Per daughter last DEXA showed osteopenia-will get report Check vitamin D level

## 2023-03-26 NOTE — Telephone Encounter (Signed)
Noted  

## 2023-03-26 NOTE — Assessment & Plan Note (Signed)
Chronic Blood pressure well-controlled CMP, CBC Continue metoprolol 25 mg twice daily

## 2023-03-26 NOTE — Assessment & Plan Note (Addendum)
Chronic Following with cardiology Continue Zetia 10 mg daily, rosuvastatin 20 mg daily Check CMP, lipid panel

## 2023-03-26 NOTE — Assessment & Plan Note (Signed)
Chronic Following with vascular surgery 

## 2023-03-26 NOTE — Assessment & Plan Note (Signed)
Chronic Has been stable Following with GYN oncology

## 2023-03-26 NOTE — Assessment & Plan Note (Signed)
Chronic No chest pain, shortness of breath or palpitations Follows with cardiology Continue current medications

## 2023-03-26 NOTE — Assessment & Plan Note (Addendum)
Chronic Controlled, Stable Continue Celexa 10 mg daily-I will prescribe when refill needed

## 2023-03-26 NOTE — Assessment & Plan Note (Signed)
Chronic Check a1c Low sugar / carb diet Stressed regular exercise  

## 2023-03-26 NOTE — Assessment & Plan Note (Addendum)
Chronic Controlled, Stable Continue trazodone 50-150 mg nightly-I will prescribe when refill needed

## 2023-04-02 ENCOUNTER — Ambulatory Visit: Payer: PPO | Admitting: Podiatry

## 2023-04-02 ENCOUNTER — Telehealth: Payer: Self-pay | Admitting: Internal Medicine

## 2023-04-02 DIAGNOSIS — F0390 Unspecified dementia without behavioral disturbance: Secondary | ICD-10-CM | POA: Insufficient documentation

## 2023-04-02 NOTE — Telephone Encounter (Signed)
Patient's daughter [name] called and said she has noticed a significant change in her mother's behavior. The patient was diagnosed with dementia last summer, but her behavior has been different lately. 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 [name] also said the patient has been suspicious and distrustful of [name] recently. [name] said the only thing that has been different is that the patient has been taking liquid bentonite for chronic diarrhea. [name] would like a call back at [phone].

## 2023-04-02 NOTE — Telephone Encounter (Signed)
I would hold the bentonite for now and use Imodium.  Is okay to use Imodium on a daily basis if needed.  See how she does with that.  If no improvement may need to rule out infection, etc.  This may also be normal fluctuations with dementia.

## 2023-04-04 ENCOUNTER — Other Ambulatory Visit: Payer: Self-pay | Admitting: Internal Medicine

## 2023-04-04 NOTE — Telephone Encounter (Signed)
Spoke with [name] today and info given.

## 2023-05-07 ENCOUNTER — Ambulatory Visit: Payer: PPO | Admitting: Cardiology

## 2023-05-12 DIAGNOSIS — N83202 Unspecified ovarian cyst, left side: Secondary | ICD-10-CM | POA: Diagnosis not present

## 2023-05-12 DIAGNOSIS — R197 Diarrhea, unspecified: Secondary | ICD-10-CM | POA: Diagnosis not present

## 2023-05-14 ENCOUNTER — Ambulatory Visit: Payer: PPO | Admitting: Cardiology

## 2023-05-15 ENCOUNTER — Telehealth: Payer: Self-pay | Admitting: Internal Medicine

## 2023-05-15 NOTE — Telephone Encounter (Signed)
Patient's daughter [name] called and said she received a call about an appointment in Dr. Lawerance Bach' office. There was no indication in the patient's chart of a need for an appointment or to reschedule anything. [name] wanted to make sure nothing was missed and would like a call back at [phone].

## 2023-05-15 NOTE — Telephone Encounter (Signed)
Message left for patient's daughter that we have not called patient for anything.

## 2023-05-19 ENCOUNTER — Ambulatory Visit: Payer: PPO | Admitting: Cardiology

## 2023-05-19 ENCOUNTER — Encounter: Payer: Self-pay | Admitting: Cardiology

## 2023-05-19 VITALS — BP 132/65 | HR 56 | Resp 16 | Ht 64.0 in | Wt 132.2 lb

## 2023-05-19 DIAGNOSIS — I719 Aortic aneurysm of unspecified site, without rupture: Secondary | ICD-10-CM | POA: Diagnosis not present

## 2023-05-19 DIAGNOSIS — F17210 Nicotine dependence, cigarettes, uncomplicated: Secondary | ICD-10-CM

## 2023-05-19 DIAGNOSIS — I2510 Atherosclerotic heart disease of native coronary artery without angina pectoris: Secondary | ICD-10-CM | POA: Diagnosis not present

## 2023-05-19 DIAGNOSIS — I739 Peripheral vascular disease, unspecified: Secondary | ICD-10-CM | POA: Diagnosis not present

## 2023-05-19 DIAGNOSIS — E782 Mixed hyperlipidemia: Secondary | ICD-10-CM | POA: Diagnosis not present

## 2023-05-19 MED ORDER — NICOTINE POLACRILEX 2 MG MT GUM: 2.0000 mg | CHEWING_GUM | OROMUCOSAL | 1 refills | Status: DC | PRN

## 2023-05-19 NOTE — Progress Notes (Signed)
Patient referred by Georgianne Fick, MD for back pain, possible angina equivalent  Subjective:   Brenda Franklin, female    DOB: 15-Jul-1946, 77 y.o.   MRN: [id number]   Chief Complaint  Patient presents with   Coronary artery disease involving native coronary artery of    HPI  77 y.o. Caucasian female with hypertension, hyperlipidemia, tobacco dependence, coronary artery disease, penetrating ulcer of infrarenal abdominal aorta  Patient is here with her daughter.  She is doing well, denies chest pain, shortness of breath, palpitations, leg edema, orthopnea, PND, TIA/syncope. She is not taking Aspirin currently, has had stomach complaints with 325 mg ASA in the past. Reviewed recent test results with the patient, details below.    Dr. Chestine Spore (10/29/2022): I discussed there has been little change over the last year and I measure this at 2.5 cm and it last measured 2.4 cm last year.  She has no flow limitation distally with palpable pulses.  I discussed I will see her again in 1 year with AAA duplex to see if we can evaluate this with ultrasound.  Discussed no indications for surgical intervention at this time.     Current Outpatient Medications:    albuterol (VENTOLIN HFA) 108 (90 Base) MCG/ACT inhaler, Inhale 1 puff into the lungs every 4 (four) hours as needed for shortness of breath or wheezing., Disp: , Rfl:    aspirin EC 81 MG tablet, Take 1 tablet (81 mg total) by mouth as directed. Swallow whole. 2-3 times a week (Patient not taking: Reported on 03/26/2023), Disp: 90 tablet, Rfl: 3   b complex vitamins capsule, Take 1 capsule by mouth daily., Disp: , Rfl:    bismuth subsalicylate (PEPTO BISMOL) 262 MG/15ML suspension, Take 30 mLs by mouth every 6 (six) hours as needed for indigestion., Disp: , Rfl:    Carboxymethylcellul-Glycerin (LUBRICATING EYE DROPS OP), Place 1 drop into both eyes daily as needed (dry eyes)., Disp: , Rfl:    Cholecalciferol (D-3-5) 125 MCG (5000 UT) capsule,  Take 5,000 Units by mouth daily., Disp: , Rfl:    citalopram (CELEXA) 10 MG tablet, Take 10 mg by mouth daily., Disp: , Rfl:    ezetimibe (ZETIA) 10 MG tablet, TAKE 1 TABLET(10 MG) BY MOUTH DAILY, Disp: 90 tablet, Rfl: 3   KRILL OIL PO, Take by mouth., Disp: , Rfl:    lidocaine (LIDODERM) 5 %, Place 1 patch onto the skin daily as needed (pain). Remove & Discard patch within 12 hours or as directed by MD, Disp: , Rfl:    metoprolol tartrate (LOPRESSOR) 25 MG tablet, TAKE 1 TABLET(25 MG) BY MOUTH TWICE DAILY, Disp: 180 tablet, Rfl: 3   Multiple Vitamin (MULTIVITAMIN) capsule, Take 1 capsule by mouth daily., Disp: , Rfl:    rosuvastatin (CRESTOR) 20 MG tablet, TAKE 1 TABLET(20 MG) BY MOUTH DAILY, Disp: 30 tablet, Rfl: 3   SYMBICORT 80-4.5 MCG/ACT inhaler, Inhale 2 puffs into the lungs 2 (two) times daily., Disp: 30.6 g, Rfl: 2   traZODone (DESYREL) 50 MG tablet, TAKE 1 TO 3 TABLETS BY MOUTH EVERY DAY AT BEDTIME AS NEEDED, Disp: 270 tablet, Rfl: 1   Ubiquinol 50 MG CAPS, Take by mouth., Disp: , Rfl:   Cardiovascular and other pertinent studies:  EKG 05/19/2023: Sinus rhythm 53 bpm  CTA abdomen pelvis 10/18/2022: VASCULAR   1. Slowly enlarging penetrating atherosclerotic ulceration arising from the distal infrarenal abdominal aorta. The maximal aortic diameter is 2.7 cm today compared to 2.4 cm in October  of 2022. Patient is already under the care of a vascular specialist. This recommendation follows ACR consensus guidelines: White Paper of the ACR Incidental Findings Committee II on Vascular Findings. J Am Coll Radiol 2013; 10:789-794.   NON-VASCULAR   1. Enlarging but simple appearing cystic lesion arising from the left ovary. Given the significant enlargement (volume increased by approximately 360%) compared to prior imaging from January of 2022, referral to gyn oncology is recommended to assess for benign or malignant serous ovarian neoplasm. 2. Colonic diverticular disease without CT  evidence of active inflammation. 3. Stable T12 and L4 compression fractures. 4. Additional ancillary findings as above.  Echocardiogram 06/20/2020:  Left ventricle cavity is normal in size and wall thickness. Normal global  wall motion. Normal LV systolic function with EF 55%. Doppler evidence of  grade I (impaired) diastolic dysfunction, normal LAP. Calculated EF 55%.  Trileaflet aortic valve.  Trace aortic regurgitation.  Mild mitral annular calcification. Trace mitral stenosis. Moderate (Grade  II) mitral regurgitation.  Inadequate TR jet to estimate pulmonary artery systolic pressure. Normal  right atrial pressure.   ABI 06/19/2020:  This exam reveals mildly decreased perfusion of the right lower extremity,  noted at the anterior tibial and post tibial artery level (ABI 0.88) and  mildly decreased perfusion of the left lower extremity, noted at the post  tibial artery level (ABI 0.88).  Mildly abnormal biphasic waveforms at the  bilateral AT.   CT Cardiac scoring 06/01/2020: Total score: 867 LM: 94 LAD: 501 LCx: 0 RCA: 272  EKG 05/25/2020: Sinus rhythm 62 bpm Normal EKG  Recent labs: 03/26/2023: Glucose 85, BUN/Cr 19/0.99. EGFR 54. Na/K 140/4.3. Rest of the CMP normal H/H 13/40. MCV 88. Platelets 181 HbA1C 6.1% Chol 180, TG 151, HDL 67, LDL 83   Review of Systems  Cardiovascular:  Negative for chest pain, dyspnea on exertion, leg swelling, palpitations and syncope.         Vitals:   05/19/23 1444  BP: 132/65  Pulse: (!) 56  Resp: 16  SpO2: 95%      Body mass index is 22.69 kg/m. Filed Weights   05/19/23 1444  Weight: 132 lb 3.2 oz (60 kg)      Objective:   Physical Exam Vitals and nursing note reviewed.  Constitutional:      General: She is not in acute distress. Neck:     Vascular: No JVD.  Cardiovascular:     Rate and Rhythm: Normal rate and regular rhythm.     Pulses:          Dorsalis pedis pulses are 0 on the right side and 0 on the  left side.       Posterior tibial pulses are 1+ on the right side and 1+ on the left side.     Heart sounds: Normal heart sounds. No murmur heard. Pulmonary:     Effort: Pulmonary effort is normal.     Breath sounds: Normal breath sounds. No wheezing or rales.  Musculoskeletal:     Right lower leg: No edema.     Left lower leg: No edema.         Assessment & Recommendations:   77 y.o. Caucasian female with hypertension, hyperlipidemia, tobacco dependence, coronary artery disease, penetrating ulcer of infrarenal abdominal aorta  Penetrating ulcer of aorta: Stable, has annual follow-up with vascular surgery. Seen by Dr. Chestine Spore (09/2022), recommended aorta duplex in 1 year.  CAD: Elevated calcium score and mildly abnormal stress test.  No overt angina/angina  equivalent symptom. Recommend Aspirin 81 mg 2-3 days a week. (Does not want to take it everyday due to GI intolerance). Continue metoprolol tartarate 25 mg bid,  Chol 180, TG 151, HDL 67, LDL 83 (03/2023) Crestor 20 mg daiy, Zetia 10 mg daily. Chol 184, TG 97, HDL 73, LDL 92 (09/2022).  Currently on Crestor 20 mg, Zetia 10 mg daily. She does not want to add any more medications at this time.   Hypertension: Controlled  Nicotine dependence: Tobacco cessation counseling:  - Currently smoking <1/4 packs/day   - Patient was informed of the dangers of tobacco abuse including stroke, cancer, and MI, as well as benefits of tobacco cessation. - Patient is willing to quit at this time. - Approximately 5 mins were spent counseling patient cessation techniques. We discussed various methods to help quit smoking, including deciding on a date to quit, joining a support group, pharmacological agents. Patient would like to use nicotine gum. - I will reassess her progress at the next follow-up visit   F/u in 6 months    Elder Negus, MD Pager: [phone] Office: [phone]

## 2023-05-20 ENCOUNTER — Encounter: Payer: Self-pay | Admitting: Nurse Practitioner

## 2023-05-29 DIAGNOSIS — K648 Other hemorrhoids: Secondary | ICD-10-CM | POA: Diagnosis not present

## 2023-05-29 DIAGNOSIS — R194 Change in bowel habit: Secondary | ICD-10-CM | POA: Diagnosis not present

## 2023-05-29 DIAGNOSIS — D122 Benign neoplasm of ascending colon: Secondary | ICD-10-CM | POA: Diagnosis not present

## 2023-05-29 DIAGNOSIS — K529 Noninfective gastroenteritis and colitis, unspecified: Secondary | ICD-10-CM | POA: Diagnosis not present

## 2023-06-03 DIAGNOSIS — D122 Benign neoplasm of ascending colon: Secondary | ICD-10-CM | POA: Diagnosis not present

## 2023-06-03 DIAGNOSIS — K529 Noninfective gastroenteritis and colitis, unspecified: Secondary | ICD-10-CM | POA: Diagnosis not present

## 2023-06-17 NOTE — Progress Notes (Signed)
[facility] - Clinic Note  06/18/2023     CHIEF COMPLAINT Patient presents for Retina Follow Up   HISTORY OF PRESENT ILLNESS: Brenda Franklin is a 78 y.o. female who presents to the clinic today for:   HPI     Retina Follow Up   Patient presents with  Dry AMD.  In both eyes.  This started 1.5 years ago.  Duration of 1.5 years.  Since onset it is stable.  I, the attending physician,  performed the HPI with the patient and updated documentation appropriately.        Comments   1.5 year retina follow up ARMD ou pt is reporting no vision changes noticed she is having some dryness and she is using refresh and it does help she denies flashes or floaters       Last edited by Rennis Chris, MD on 06/18/2023  5:03 PM.    Patient feels the vision is the same. She states the left eye has a lot of dryness. She is using refresh artificial tears.  Referring physician: Irena Reichmann, DO [address] [place],  [place] [address]  HISTORICAL INFORMATION:   Selected notes from the MEDICAL RECORD NUMBER     CURRENT MEDICATIONS: No current outpatient medications on file. (Ophthalmic Drugs)   No current facility-administered medications for this visit. (Ophthalmic Drugs)   Current Outpatient Medications (Other)  Medication Sig   albuterol (VENTOLIN HFA) 108 (90 Base) MCG/ACT inhaler Inhale 1 puff into the lungs every 4 (four) hours as needed for shortness of breath or wheezing.   b complex vitamins capsule Take 1 capsule by mouth daily.   bismuth subsalicylate (PEPTO BISMOL) 262 MG/15ML suspension Take 30 mLs by mouth every 6 (six) hours as needed for indigestion.   Cholecalciferol (D-3-5) 125 MCG (5000 UT) capsule Take 5,000 Units by mouth daily.   citalopram (CELEXA) 10 MG tablet Take 10 mg by mouth daily.   ezetimibe (ZETIA) 10 MG tablet TAKE 1 TABLET(10 MG) BY MOUTH DAILY   KRILL OIL PO Take by mouth.   lidocaine (LIDODERM) 5 % Place 1 patch onto the  skin daily as needed (pain). Remove & Discard patch within 12 hours or as directed by MD   metoprolol tartrate (LOPRESSOR) 25 MG tablet TAKE 1 TABLET(25 MG) BY MOUTH TWICE DAILY   Multiple Vitamin (MULTIVITAMIN) capsule Take 1 capsule by mouth daily.   nicotine polacrilex (NICORETTE) 2 MG gum Take 1 each (2 mg total) by mouth as needed for smoking cessation. (Patient not taking: Reported on 06/18/2023)   rosuvastatin (CRESTOR) 20 MG tablet TAKE 1 TABLET(20 MG) BY MOUTH DAILY   SYMBICORT 80-4.5 MCG/ACT inhaler Inhale 2 puffs into the lungs 2 (two) times daily.   traZODone (DESYREL) 50 MG tablet TAKE 1 TO 3 TABLETS BY MOUTH EVERY DAY AT BEDTIME AS NEEDED   Ubiquinol 50 MG CAPS Take by mouth.   No current facility-administered medications for this visit. (Other)   REVIEW OF SYSTEMS: ROS   Positive for: Neurological, Skin, Eyes Negative for: Constitutional, Gastrointestinal, Genitourinary, Musculoskeletal, HENT, Endocrine, Cardiovascular, Respiratory, Psychiatric, Allergic/Imm, Heme/Lymph Last edited by Etheleen Mayhew, COT on 06/18/2023  2:33 PM.      ALLERGIES Allergies  Allergen Reactions   Tolmetin Nausea And Vomiting   Codeine Nausea And Vomiting and Other (See Comments)    Upset stomach   Lovastatin     Other reaction(s): myalgia   Milk-Related Compounds Diarrhea   Mirtazapine  Other reaction(s): loose stool, too sedating   Nsaids Diarrhea and Other (See Comments)    Stomach upset   Vilazodone Hcl     Other reaction(s): hallucination w 20mg  dose   Oxycodone Nausea And Vomiting   PAST MEDICAL HISTORY Past Medical History:  Diagnosis Date   Asthma    on symbicort; rare albuterol use   BCC (basal cell carcinoma) 12/09/2012   right inner eye ([id number])   CAD (coronary artery disease)    Cataracts, bilateral    Compression fracture of fifth lumbar vertebra (HCC)    Depression    Dysrhythmia    Extremity pain    High cholesterol    Past Surgical History:   Procedure Laterality Date   ABDOMINAL HYSTERECTOMY     AUGMENTATION MAMMAPLASTY Bilateral    BREAST ENHANCEMENT SURGERY     COLONOSCOPY     ESOPHAGEAL DILATION     EXCISION VAGINAL CYST     HEMORRHOID SURGERY     iterstim implant removal  2016   MASS EXCISION Left 09/12/2021   Procedure: EXCISION MASS LEFT NECK MASS;  Surgeon: Newman Pies, MD;  Location: [facility] OR;  Service: ENT;  Laterality: Left;   metal stimulator for bowel control     Removed   NASAL SEPTOPLASTY W/ TURBINOPLASTY Bilateral 09/12/2021   Procedure: NASAL SEPTOPLASTY WITH TURBINATE REDUCTION;  Surgeon: Newman Pies, MD;  Location: [facility] OR;  Service: ENT;  Laterality: Bilateral;   NOSE SURGERY     FAMILY HISTORY Family History  Problem Relation Age of Onset   Other Mother        Intestinal problem - gangrene   Cancer Father        Bile Duct Cancer   Heart disease Father    Heart attack Father    Hypercholesterolemia Father    Bone cancer Sister    Bone cancer Brother    Colon cancer Neg Hx    Breast cancer Neg Hx    Ovarian cancer Neg Hx    Endometrial cancer Neg Hx    Pancreatic cancer Neg Hx    Prostate cancer Neg Hx    SOCIAL HISTORY Social History   Tobacco Use   Smoking status: Every Day    Current packs/day: 0.50    Types: Cigarettes    Passive exposure: Never   Smokeless tobacco: Never  Vaping Use   Vaping status: Never Used  Substance Use Topics   Alcohol use: No   Drug use: No       OPHTHALMIC EXAM:  Base Eye Exam     Visual Acuity (Snellen - Linear)       Right Left   Dist [place] 20/25 -2 20/20   Dist ph [place] NI          Tonometry (Tonopen, 2:40 PM)       Right Left   Pressure 12 13         Pupils       Pupils Dark Light Shape React APD   Right PERRL 2 1 Round Brisk None   Left PERRL 2 1 Round Brisk None         Visual Fields       Left Right    Full Full         Extraocular Movement       Right Left    Full, Ortho Full, Ortho         Neuro/Psych      Oriented x3: Yes   Mood/Affect:  Normal         Dilation     Both eyes: 2.5% Phenylephrine @ 2:40 PM           Slit Lamp and Fundus Exam     Slit Lamp Exam       Right Left   Lids/Lashes Dermato Dermato   Conjunctiva/Sclera White and quiet White and quiet   Cornea Arcus, trace PEE, well healed cataract wound Arcus, trace tear film debris, well healed cataract wounds, trace endo pigment, 1+ fine PEE   Anterior Chamber Deep and clear Deep and clear, No cell or flare   Iris Round and moderately dilated, small TID defect at 0300 Round and moderately dilated   Lens PC IOL in good position PC IOL in good position   Anterior Vitreous Synerisis Synerisis         Fundus Exam       Right Left   Disc Pink, sharp, compact Pink, sharp, compact, Mild temporal PPA   C/D Ratio 0.1 0.1   Macula flat, Blunted foveal reflex, RPE mottling and clumping, Drusen, No heme or edema Blunted foveal reflex, Drusen, RPE mottling and clumping, No heme or edema.   Vessels Attenuated, tortuous, Copper wiring, AV crossing changes Attenuated, tortuous, Copper wiring, AV crossing changes   Periphery Attached, Reticular degeneration, No heme Attached, Reticular degeneration, No heme           IMAGING AND PROCEDURES  Imaging and Procedures for 06/18/2023  OCT, Retina - OU - Both Eyes       Right Eye Quality was good. Central Foveal Thickness: 255. Progression has been stable. Findings include normal foveal contour, no IRF, no SRF, retinal drusen .   Left Eye Quality was good. Central Foveal Thickness: 260. Progression has been stable. Findings include normal foveal contour, no IRF, no SRF, retinal drusen .   Notes *Images captured and stored on drive  Diagnosis / Impression:  Non-exu ARMD OU - stable  Clinical management:  See below  Abbreviations: NFP - Normal foveal profile. CME - cystoid macular edema. PED - pigment epithelial detachment. IRF - intraretinal fluid. SRF - subretinal  fluid. EZ - ellipsoid zone. ERM - epiretinal membrane. ORA - outer retinal atrophy. ORT - outer retinal tubulation. SRHM - subretinal hyper-reflective material. IRHM - intraretinal hyper-reflective material            ASSESSMENT/PLAN:    ICD-10-CM   1. Intermediate stage nonexudative age-related macular degeneration of both eyes  H35.3132 OCT, Retina - OU - Both Eyes    2. Essential hypertension  I10     3. Hypertensive retinopathy of both eyes  H35.033     4. Pseudophakia, both eyes  Z96.1     5. Dry eyes  H04.123      1. Age related macular degeneration, non-exudative, both eyes   - intermediate stage w/ mild drusen -- stable - The incidence, anatomy, and pathology of dry AMD, risk of progression, and the AREDS and AREDS 2 study including smoking risks discussed with patient.  - Recommend amsler grid monitoring             - F/u in 1 yr -- DFE/OCT  2,3. Hypertensive retinopathy OU  - exam with dilated and tortuous venules OU - discussed importance of tight BP control. - BP in office 7.20.21: 156/72 - monitor  4. Pseudophakia OU  - s/p CE/IOL (Dr. Sherrine Maples, 11.2021)  - IOL in good position, doing well  - history of post op  CME -- stably resolved today  - monitor  5. Dry eyes OU - recommend artificial tears and lubricating ointment as needed - Most likely cause for most recent flare-up of blurred vision OS   Ophthalmic Meds Ordered this visit:  No orders of the defined types were placed in this encounter.    Return in about 1 year (around 06/17/2024) for f/u Non Ex. AMD OU, DFE, OCT.  There are no Patient Instructions on file for this visit.  This document serves as a record of services personally performed by Karie Chimera, MD, PhD. It was created on their behalf by Glee Arvin. Manson Passey, OA an ophthalmic technician. The creation of this record is the provider's dictation and/or activities during the visit.    Electronically signed by: Glee Arvin. Manson Passey, OA 06/18/23 5:06  PM  This document serves as a record of services personally performed by Karie Chimera, MD, PhD. It was created on their behalf by Charlette Caffey, COT an ophthalmic technician. The creation of this record is the provider's dictation and/or activities during the visit.    Electronically signed by:  Charlette Caffey, COT  06/18/23 5:06 PM  Karie Chimera, M.D., Ph.D. Diseases & Surgery of the Retina and Vitreous [facility]  I have reviewed the above documentation for accuracy and completeness, and I agree with the above. Karie Chimera, M.D., Ph.D. 06/18/23 5:07 PM  Abbreviations: M myopia (nearsighted); A astigmatism; H hyperopia (farsighted); P presbyopia; Mrx spectacle prescription;  CTL contact lenses; OD right eye; OS left eye; OU both eyes  XT exotropia; ET esotropia; PEK punctate epithelial keratitis; PEE punctate epithelial erosions; DES dry eye syndrome; MGD meibomian gland dysfunction; ATs artificial tears; PFAT's preservative free artificial tears; NSC nuclear sclerotic cataract; PSC posterior subcapsular cataract; ERM epi-retinal membrane; PVD posterior vitreous detachment; RD retinal detachment; DM diabetes mellitus; DR diabetic retinopathy; NPDR non-proliferative diabetic retinopathy; PDR proliferative diabetic retinopathy; CSME clinically significant macular edema; DME diabetic macular edema; dbh dot blot hemorrhages; CWS cotton wool spot; POAG primary open angle glaucoma; C/D cup-to-disc ratio; HVF humphrey visual field; GVF goldmann visual field; OCT optical coherence tomography; IOP intraocular pressure; BRVO Branch retinal vein occlusion; CRVO central retinal vein occlusion; CRAO central retinal artery occlusion; BRAO branch retinal artery occlusion; RT retinal tear; SB scleral buckle; PPV pars plana vitrectomy; VH Vitreous hemorrhage; PRP panretinal laser photocoagulation; IVK intravitreal kenalog; VMT vitreomacular traction; MH Macular hole;  NVD  neovascularization of the disc; NVE neovascularization elsewhere; AREDS age related eye disease study; ARMD age related macular degeneration; POAG primary open angle glaucoma; EBMD epithelial/anterior basement membrane dystrophy; ACIOL anterior chamber intraocular lens; IOL intraocular lens; PCIOL posterior chamber intraocular lens; Phaco/IOL phacoemulsification with intraocular lens placement; PRK photorefractive keratectomy; LASIK laser assisted in situ keratomileusis; HTN hypertension; DM diabetes mellitus; COPD chronic obstructive pulmonary disease

## 2023-06-18 ENCOUNTER — Encounter (INDEPENDENT_AMBULATORY_CARE_PROVIDER_SITE_OTHER): Payer: Self-pay | Admitting: Ophthalmology

## 2023-06-18 ENCOUNTER — Ambulatory Visit (INDEPENDENT_AMBULATORY_CARE_PROVIDER_SITE_OTHER): Payer: PPO | Admitting: Ophthalmology

## 2023-06-18 DIAGNOSIS — Z961 Presence of intraocular lens: Secondary | ICD-10-CM | POA: Diagnosis not present

## 2023-06-18 DIAGNOSIS — H04123 Dry eye syndrome of bilateral lacrimal glands: Secondary | ICD-10-CM

## 2023-06-18 DIAGNOSIS — H35033 Hypertensive retinopathy, bilateral: Secondary | ICD-10-CM | POA: Diagnosis not present

## 2023-06-18 DIAGNOSIS — H353132 Nonexudative age-related macular degeneration, bilateral, intermediate dry stage: Secondary | ICD-10-CM

## 2023-06-18 DIAGNOSIS — I10 Essential (primary) hypertension: Secondary | ICD-10-CM | POA: Diagnosis not present

## 2023-06-18 DIAGNOSIS — H353131 Nonexudative age-related macular degeneration, bilateral, early dry stage: Secondary | ICD-10-CM

## 2023-07-07 ENCOUNTER — Ambulatory Visit (HOSPITAL_COMMUNITY)
Admission: RE | Admit: 2023-07-07 | Discharge: 2023-07-07 | Disposition: A | Discharge status: Home / Self Care | Payer: PPO | Source: Ambulatory Visit | Attending: Gynecologic Oncology | Admitting: Gynecologic Oncology

## 2023-07-07 DIAGNOSIS — N838 Other noninflammatory disorders of ovary, fallopian tube and broad ligament: Secondary | ICD-10-CM | POA: Diagnosis not present

## 2023-07-07 DIAGNOSIS — N83202 Unspecified ovarian cyst, left side: Secondary | ICD-10-CM | POA: Insufficient documentation

## 2023-07-07 DIAGNOSIS — Z90710 Acquired absence of both cervix and uterus: Secondary | ICD-10-CM | POA: Diagnosis not present

## 2023-07-22 ENCOUNTER — Telehealth: Payer: Self-pay

## 2023-07-22 ENCOUNTER — Encounter: Payer: Self-pay | Admitting: Internal Medicine

## 2023-07-22 ENCOUNTER — Ambulatory Visit: Payer: PPO | Admitting: Internal Medicine

## 2023-07-22 ENCOUNTER — Telehealth: Payer: Self-pay | Admitting: Gynecologic Oncology

## 2023-07-22 VITALS — BP 119/69 | HR 54 | Ht 64.0 in | Wt 131.2 lb

## 2023-07-22 DIAGNOSIS — N83202 Unspecified ovarian cyst, left side: Secondary | ICD-10-CM

## 2023-07-22 DIAGNOSIS — J4540 Moderate persistent asthma, uncomplicated: Secondary | ICD-10-CM | POA: Diagnosis not present

## 2023-07-22 NOTE — Telephone Encounter (Signed)
Called [name]. Discussed ultrasound results. Cystic still appears simple. Some increase in size although not significant since last ultrasound. There has been fluctuation over last several imaging studies. Overall, I still believe this is very likely a benign process and they would like to avoid surgery if possible. Will plan for follow-up imaging in Summer 2025. Discussed symptoms that should prompt a phone call before then.  Eugene Garnet MD Gynecologic Oncology

## 2023-07-22 NOTE — Progress Notes (Signed)
OV 07/25/2020  Subjective:  Patient ID: Brenda Franklin, female , DOB: [birth year] , age 77 y.o. , MRN: [id number] , ADDRESS: [address] [place] [address] PCP Pearson Grippe, MD Patient Care Team: Pearson Grippe, MD as PCP - General (Internal Medicine) Janalyn Harder, MD as Consulting Physician (Dermatology)  This Provider for this visit: Treatment Team:  Attending Provider: Luciano Cutter, MD    07/25/2020 -   Chief Complaint  Patient presents with   Consult    SOB at times, back pain    Brenda Franklin 77 y.o. -77 year old female accompanied by her daughter Brenda Franklin.  Patient is originally from [place].  She relocated to [place] at the time of divorce with her 4 kids.  And her daughter Brenda Franklin relocated to [place] and the patient accompanied her.  Patient I think lives with the daughter.  She has hypertension, hyperlipidemia tobacco dependence and coronary artery calcification.  They have been referred for pulmonary evaluation because of shortness of breath and infrascapular pain and very mild early morning cough.  Cardiologist Dr. Rosemary Holms.  History as best as I can gather is that for the last 1 year she has had this infrascapular pain that is deep and gnawing.  This happens intermittently but daily.  Over the course of the last 1 year the frequency and severity slightly more but overall is mild to moderate in severity.  When she rests it gets better.  They did see the cardiologist.  She had cardiac stress test June 21, 2020 low risk study stress ejection fraction 69%.  She also had echocardiogram June 20, 2020: It appears normal.  Her EKG is reported as normal.  She continues to smoke.  In the background it appears for the last few years she has had insidious onset of shortness of breath particularly with heavy exertion although she is able to do her ADLs.  Slight early morning cough.  They are not sure of the shortness of breath itself is gotten worse with time but possibly  so.  Review of her the labs indicate a chest x-ray August 2019: To me looks hyperinflated I personally visualized this.  Unclear if there is any findings of ILD.  08/28/2020- Interim hx   77 year old female, current smoker (30 pack year hx). Past medical hx HTN, CAD, mixed hyperlipidemia. Patient of Dr. Marchelle Gearing, seen for initial consult on 07/25/20 for dyspnea. She saw Dr. Marchelle Gearing on 07/25/20 for initial consult for dyspnea on exertion. HRCT showed no evidence of ILD, small air trapping and coronary artery calcifications. Echocardiogram in September 2021 showed normal LV systolic function with EF 55%, doppler evidence of grade 1 diastolic dysfunction. Mild mitral annular calcification, trace stenosis. Moderate grade 2 mitral regurgitation. Started on Spiriva respimat 1.38mcg daily for suspected COPD. Follows with cardiology.   Patient presents today for 1 month follow-up with PFTs. Accompanied by her daughter. She gets winded with moderate exertion and inclines. She has an occasional morning cough.  States that she has a deviated septum which causes her to have a hard time breathing through her nose at night. She has been using Spiriva daily but is having a difficulty administering it.  She does follow with ENT, she is considering surgical options down the line. Denies chest tightness, wheezing or cough.   Pulmonary function testing FVC 2.12 (76%), FEV1 1.68 (80%), ratio 79, TLC 134%, DLCOunc 19.05 (99%) +BD response Mild restriction with reversibility. Normal diffusion capacity.    CT chest showed  no evidence of interstitial lung disease (pulmonary fibrosis), small airway trapping and coronary artery calcifications   OV 06/15/2021  Subjective:  Patient ID: Brenda Franklin, female , DOB: Sep 18, 1946 , age 24 y.o. , MRN: [id number] , ADDRESS: [address] [place] [address] PCP Irena Reichmann, DO Patient Care Team: Irena Reichmann, DO as PCP - General (Family Medicine) Janalyn Harder, MD as  Consulting Physician (Dermatology)  This Provider for this visit: Treatment Team:  Attending Provider: Kalman Shan, MD    06/15/2021 -   Chief Complaint  Patient presents with   Follow-up    Patient reports she is doing well with asthma.      HPI Brenda Franklin 77 y.o. -presents for follow-up.  Presents with her daughter Brenda Franklin who works in Community education officer.  I personally saw her only 1 time last year.  Its been almost a year.  The daughter wishes only for once a year follow-up and as needed if something gets worse.  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  Diagnosed with asthma was made based on testing of pulmonary function test and CT scan.  She was prescribed Symbicort.  Currently her ACT score is 21.  Occasionally she will get a panic attack or anxiety attack and she will take Symbicort or albuterol for rescue.  This is happened a few times and this seems to help.  However daughter says overall the Symbicort is helping with the shortness of breath.  Without the Symbicort patient can deteriorate.  Patient's mostly at home.  She does some activities of daily living such as driving to the grocery store and taking care of the dog and the dishes.  Otherwise is mostly sedentary.  No nocturnal awakenings.  No wheezing no albuterol rescue use of significance.  No prednisone use no emergency visits.  No new diagnoses.  Asthma Control Test ACT Total Score  06/15/2021 21     07/02/2022- Interim hx  Patient presents today for fu asthma.  Accompanied by her daughter.  She is doing well today without acute respiratory complaints.  Her daughter does report that she had an episode this morning while in the shower where she felt lightheaded/faint.  She did not lose consciousness or fall.  Patient feels it was related to her not eating.  She had cereal around 10 AM after this event.  Her only complaint today is dry nasal passages which she states can interfere with her breathing through her nose.  She uses  saline spray.  She is compliant with Symbicort 2 puffs twice daily. She has no breakthrough asthma symptoms.  She has not needed Albuterol rescue inhaler. She denies chest tightness, wheezing or cough.   Asthma: Respiratory symptoms- Dry nasal passages  Last exacerbation- None Maintenance medication- Symbicort and prn Albuterol  SABA use- None Prednisone use- None ACT score- 25     OV 07/22/2023  Subjective:  Patient ID: Brenda Franklin, female , DOB: Nov 10, 1945 , age 71 y.o. , MRN: [id number] , ADDRESS: [address] [place] [address] PCP Pincus Sanes, MD Patient Care Team: Pincus Sanes, MD as PCP - General (Internal Medicine)  This Provider for this visit: Treatment Team:  Attending Provider: Kalman Shan, MD    07/22/2023 -   Chief Complaint  Patient presents with   Follow-up    Pt states no concerns      HPI Brenda Franklin 77 y.o. -well-controlled moderate persistent asthma here for annual follow-up.  She lives in [place]  with her daughter Brenda Franklin.  Brenda Franklin is here with her.  She tells me that her asthma is extremely well-controlled.  The only ER visit according to the daughter who is an independent historian in the last 1 year is fall with a wrist fracture for which she is on vitamin D.  Review of the external medical record indicates this ER visit was in May 2023.  There is a med change for statin but otherwise no hospitalizations no ER visits.  No nocturnal awakenings albuterol use is rare.  I asked her if she is willing to taper her Symbicort down but she is not interested in this.  Review of the extramedical records indicate she is followed up with Dr. Rosemary Holms and cardiology in the interim.  She is also followed up with Dr. 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   Smoking history - ssince 1980s. Stil smoking. Not interested in cT chest at this visit   Simple office walk 185 feet x  3 laps goal with forehead probe 07/25/2020   O2 used ra  Number laps completed 3  Comments about pace average  Resting Pulse Ox/HR 97% and 80/min  Final Pulse Ox/HR 96% and 88/min  Desaturated </= 88% no  Desaturated <= 3% points no  Got Tachycardic >/= 90/min no  Symptoms at end of test No dyspnea, just back pain  Miscellaneous comments Talked duirng walk      PFT     Latest Ref Rng & Units 08/28/2020    3:08 PM  PFT Results  FVC-Pre L 1.93   FVC-Predicted Pre % 69   FVC-Post L 2.12   FVC-Predicted Post % 76   Pre FEV1/FVC % % 77   Post FEV1/FCV % % 79   FEV1-Pre L 1.49   FEV1-Predicted Pre % 71   FEV1-Post L 1.68   DLCO uncorrected ml/min/mmHg 19.05   DLCO UNC% % 99   DLCO corrected ml/min/mmHg 19.05   DLCO COR %Predicted % 99   DLVA Predicted % 129   TLC L 6.82   TLC % Predicted % 134   RV % Predicted % 195        Latest Reference Range & Units 06/30/13 12:30 03/26/23 13:13  Eosinophils Absolute 0.0 - 0.7 K/uL 0.1 0.1    LAB RESULTS last 96 hours No results found.  LAB RESULTS last 90 days No results found for this or any previous visit (from the past 2160 hour(s)).       has a past medical history of Asthma, BCC (basal cell carcinoma) (12/09/2012), CAD (coronary artery disease), Cataracts, bilateral, Compression fracture of fifth lumbar vertebra (HCC), Depression, Dysrhythmia, Extremity pain, and High cholesterol.   reports that she has been smoking cigarettes. She started smoking about 44 years ago. She has a 22.4 pack-year smoking history. She has never been exposed to tobacco smoke. She has never used smokeless tobacco.  Past Surgical History:  Procedure Laterality Date   ABDOMINAL HYSTERECTOMY     AUGMENTATION MAMMAPLASTY Bilateral    BREAST ENHANCEMENT  SURGERY     COLONOSCOPY     ESOPHAGEAL DILATION     EXCISION VAGINAL CYST     HEMORRHOID SURGERY     iterstim implant removal  2016   MASS EXCISION Left 09/12/2021   Procedure:  EXCISION MASS LEFT NECK MASS;  Surgeon: Newman Pies, MD;  Location: [facility] OR;  Service: ENT;  Laterality: Left;   metal stimulator for bowel control     Removed   NASAL SEPTOPLASTY W/ TURBINOPLASTY Bilateral 09/12/2021   Procedure: NASAL SEPTOPLASTY WITH TURBINATE REDUCTION;  Surgeon: Newman Pies, MD;  Location: [facility] OR;  Service: ENT;  Laterality: Bilateral;   NOSE SURGERY      Allergies  Allergen Reactions   Tolmetin Nausea And Vomiting   Codeine Nausea And Vomiting and Other (See Comments)    Upset stomach   Lovastatin     Other reaction(s): myalgia   Milk-Related Compounds Diarrhea   Mirtazapine     Other reaction(s): loose stool, too sedating   Nsaids Diarrhea and Other (See Comments)    Stomach upset   Vilazodone Hcl     Other reaction(s): hallucination w 20mg  dose   Oxycodone Nausea And Vomiting    Immunization History  Administered Date(s) Administered   PFIZER(Purple Top)SARS-COV-2 Vaccination 11/29/2019, 12/30/2019   Rabies, IM 12/05/2015, 12/05/2015, 12/07/2015, 12/11/2015, 12/18/2015    Family History  Problem Relation Age of Onset   Other Mother        Intestinal problem - gangrene   Cancer Father        Bile Duct Cancer   Heart disease Father    Heart attack Father    Hypercholesterolemia Father    Bone cancer Sister    Bone cancer Brother    Colon cancer Neg Hx    Breast cancer Neg Hx    Ovarian cancer Neg Hx    Endometrial cancer Neg Hx    Pancreatic cancer Neg Hx    Prostate cancer Neg Hx      Current Outpatient Medications:    albuterol (VENTOLIN HFA) 108 (90 Base) MCG/ACT inhaler, Inhale 1 puff into the lungs every 4 (four) hours as needed for shortness of breath or wheezing., Disp: , Rfl:    b complex vitamins capsule, Take 1 capsule by mouth daily., Disp: , Rfl:    bismuth  subsalicylate (PEPTO BISMOL) 262 MG/15ML suspension, Take 30 mLs by mouth every 6 (six) hours as needed for indigestion., Disp: , Rfl:    Cholecalciferol (D-3-5) 125 MCG (5000 UT) capsule, Take 5,000 Units by mouth daily., Disp: , Rfl:    citalopram (CELEXA) 10 MG tablet, Take 10 mg by mouth daily., Disp: , Rfl:    ezetimibe (ZETIA) 10 MG tablet, TAKE 1 TABLET(10 MG) BY MOUTH DAILY, Disp: 90 tablet, Rfl: 3   KRILL OIL PO, Take by mouth., Disp: , Rfl:    lidocaine (LIDODERM) 5 %, Place 1 patch onto the skin daily as needed (pain). Remove & Discard patch within 12 hours or as directed by MD, Disp: , Rfl:    metoprolol tartrate (LOPRESSOR) 25 MG tablet, TAKE 1 TABLET(25 MG) BY MOUTH TWICE DAILY, Disp: 180 tablet, Rfl: 3   Multiple Vitamin (MULTIVITAMIN) capsule, Take 1 capsule by mouth daily., Disp: , Rfl:    nicotine polacrilex (NICORETTE) 2 MG gum, Take 1 each (2 mg total) by mouth as needed for smoking cessation., Disp: 100 tablet, Rfl: 1   rosuvastatin (CRESTOR) 20 MG tablet, TAKE 1 TABLET(20 MG) BY MOUTH DAILY, Disp: 30 tablet, Rfl: 3   SYMBICORT 80-4.5 MCG/ACT inhaler, Inhale 2 puffs into the lungs 2 (two) times daily., Disp: 30.6 g, Rfl: 2   traZODone (DESYREL) 50 MG tablet, TAKE 1 TO 3 TABLETS BY MOUTH EVERY DAY AT BEDTIME AS NEEDED, Disp:  270 tablet, Rfl: 1   Ubiquinol 50 MG CAPS, Take by mouth., Disp: , Rfl:       Objective:   Vitals:   07/22/23 1612  BP: 119/69  Pulse: (!) 54  SpO2: 97%  Weight: 131 lb 3.2 oz (59.5 kg)  Height: 5\' 4"  (1.626 m)    Estimated body mass index is 22.52 kg/m as calculated from the following:   Height as of this encounter: 5\' 4"  (1.626 m).   Weight as of this encounter: 131 lb 3.2 oz (59.5 kg).  @WEIGHTCHANGE @  Filed Weights   07/22/23 1612  Weight: 131 lb 3.2 oz (59.5 kg)     Physical Exam   General: No distress. Looks well O2 at rest: no Cane present: no Sitting in wheel chair: no Frail: no Obese: no Neuro: Alert and Oriented x 3.  GCS 15. Speech normal Psych: Pleasant Resp:  Barrel Chest - no.  Wheeze - no, Crackles - no, No overt respiratory distress CVS: Normal heart sounds. Murmurs - no Ext: Stigmata of Connective Tissue Disease - no HEENT: Normal upper airway. PEERL +. No post nasal drip        Assessment:       ICD-10-CM   1. Asthma, well controlled, moderate persistent  J45.40          Plan:     Patient Instructions     ICD-10-CM   1. Asthma, well controlled, moderate persistent  J45.40       Good control on symbicort   Plan  - continue symbicort  2 puff twice daily with albuterol as needed  - respect lack of desire to wean down on symbicort  - respect flu shot deferral - do spirometry  in 1 year  Followup  - 1 year or sooner if needed   FOLLOWUP Return in about 1 year (around 07/21/2024) for Face to Face Visit, with Dr Marchelle Gearing, Asthma.    SIGNATURE    Dr. Kalman Shan, M.D., F.C.C.P,  Pulmonary and Critical Care Medicine Staff Physician, [facility] Director - Interstitial Lung Disease  Program  [facility] [place], [place], [address]  Pager: [phone], If no answer or between  15:00h - 7:00h: call [phone] Telephone: [phone]  4:36 PM 07/22/2023

## 2023-07-22 NOTE — Telephone Encounter (Signed)
Per Dr. Pricilla Holm on last phone message, pt is scheduled for an ultrasound on 03/10/24 @ 1:30 with arrival time of 1:15.   Pt's daughter aware.

## 2023-07-22 NOTE — Patient Instructions (Addendum)
ICD-10-CM   1. Asthma, well controlled, moderate persistent  J45.40       Good control on symbicort   Plan  - continue symbicort  2 puff twice daily with albuterol as needed  - respect lack of desire to wean down on symbicort  - respect flu shot deferral - do spirometry  in 1 year  Followup  - 1 year or sooner if needed

## 2023-07-22 NOTE — Telephone Encounter (Signed)
[name], patients daughter, called stating her mom had an ultrasound on 07/07/23 and would like the results and also advice on follow up plan.   [name] is aware of Dr. Pricilla Holm not being in the office today, message sent and aware they will get a call from Dr.Tucker or our office with results. She states an understanding. Call back number per [name]'s request is [phone]

## 2023-07-22 NOTE — Telephone Encounter (Signed)
Results not back until this morning. Just spoke with the patient's daughter. Please schedule her for ultrasound in June 2025 - order is in. Thank you

## 2023-07-24 ENCOUNTER — Telehealth: Payer: Self-pay | Admitting: Cardiology

## 2023-07-24 NOTE — Telephone Encounter (Signed)
Pt and daughter called into the office to let Dr. Rosemary Holms know that the pt had an episode of chest discomfort last night that woke her up.   She states this only lasted a short bit and chest discomfort subsided on its own.  She states she has had no chest discomfort at all since the episode last night.   She states when the episode of chest discomfort occurred, she had no accompanied cardiac symptoms like sob, doe, palpitations, N/V, diaphoresis, pre-syncope/syncope, or swelling to lower extremities.   She denies any other cardiac complaints at this time.   She states her pressures were a little more elevated this morning than they usually are.  Pt reports her BP this morning was 140/76 HR-63 bpm. She states she usually runs in the 120s/80s.   Pt states her back has been hurting her recently, and associates elevated BP reading with the pain.  Offered to bring her in for an appointment for this and she declined at this time.   Pt states if her chest pain reoccurs, she will call the office to let us know so that we can schedule her an appt with Dr. Rosemary Holms or an Extender for this.   ED precautions provided to the pt, if symptoms were to worsen, return, or persist.   Also advised her and the daughter to continue monitoring for symptoms and to continue monitoring/tracking her pressures, and let us know if her BP consistently stays at 130/90 or greater.   Also spoke with Dr. Rosemary Holms about this, and he agreed to this plan as well.   Both verbalized understanding and agrees with this plan.

## 2023-07-24 NOTE — Telephone Encounter (Signed)
   Pt c/o of Chest Pain: STAT if active CP, including tightness, pressure, jaw pain, radiating pain to shoulder/upper arm/back, CP unrelieved by Nitro. Symptoms reported of SOB, nausea, vomiting, sweating.  1. Are you having CP right now? No     2. Are you experiencing any other symptoms (ex. SOB, nausea, vomiting, sweating)? No    3. Is your CP continuous or coming and going? Coming and going    4. Have you taken Nitroglycerin? No    5. How long have you been experiencing CP? Came last night and this morning. She is feeling better now. She states something like this happened in the past and they had pt come in for an EKG.    6. If NO CP at time of call then end call with telling Pt to call back or call 911 if Chest pain returns prior to return call from triage team.

## 2023-07-25 ENCOUNTER — Ambulatory Visit (INDEPENDENT_AMBULATORY_CARE_PROVIDER_SITE_OTHER): Payer: PPO

## 2023-07-25 ENCOUNTER — Other Ambulatory Visit: Payer: Self-pay | Admitting: Internal Medicine

## 2023-07-25 VITALS — Ht 64.0 in | Wt 131.0 lb

## 2023-07-25 DIAGNOSIS — Z0000 Encounter for general adult medical examination without abnormal findings: Secondary | ICD-10-CM | POA: Diagnosis not present

## 2023-07-25 DIAGNOSIS — F17210 Nicotine dependence, cigarettes, uncomplicated: Secondary | ICD-10-CM | POA: Diagnosis not present

## 2023-07-25 DIAGNOSIS — E2839 Other primary ovarian failure: Secondary | ICD-10-CM

## 2023-07-25 DIAGNOSIS — Z780 Asymptomatic menopausal state: Secondary | ICD-10-CM

## 2023-07-25 NOTE — Patient Instructions (Addendum)
Brenda Franklin , Thank you for taking time to come for your Medicare Wellness Visit. I appreciate your ongoing commitment to your health goals. Please review the following plan we discussed and let me know if I can assist you in the future.   Referrals/Orders/Follow-Ups/Clinician Recommendations: You are due for a Tetanus vaccine.  It was a pleasure speaking with you today.  Each day, aim for 6 glasses of water, plenty of protein in your diet and try to get up and walk/ stretch every hour for 5-10 minutes at a time.  Remember to call and get scheduled for you Lung cancer screening and you Bone density screening.  You have an order for:   [x]   Bone Density     Please call for appointment:    [facility] [address] [place], [place] [address] [phone]     Make sure to wear two-piece clothing.  No lotions, powders, or deodorants the day of the appointment. Make sure to bring picture ID and insurance card.  Bring list of medications you are currently taking including any supplements.   Schedule your [place] screening mammogram through MyChart!   Log into your MyChart account.  Go to 'Visit' (or 'Appointments' if on mobile App) --> Schedule an Appointment  Under 'Select a Reason for Visit' choose the Mammogram Screening option.  Complete the pre-visit questions and select the time and place that best fits your schedule.    This is a list of the screening recommended for you and due dates:  Health Maintenance  Topic Date Due   Hepatitis C Screening  Never done   DTaP/Tdap/Td vaccine (1 - Tdap) Never done   Zoster (Shingles) Vaccine (1 of 2) Never done   DEXA scan (bone density measurement)  Never done   Screening for Lung Cancer  08/09/2021   Medicare Annual Wellness Visit  07/24/2024   Pneumonia Vaccine  Completed   HPV Vaccine  Aged Out   Flu Shot  Discontinued   COVID-19 Vaccine  Discontinued    Advanced directives: (Copy Requested) Please  bring a copy of your health care power of attorney and living will to the office to be added to your chart at your convenience.  Next Medicare Annual Wellness Visit scheduled for next year: Yes

## 2023-07-25 NOTE — Progress Notes (Signed)
Subjective:   Brenda Franklin is a 77 y.o. female who presents for Medicare Annual (Subsequent) preventive examination.  Visit Complete: Virtual I connected with  Brenda Franklin on 07/25/23 by a audio enabled telemedicine application and verified that I am speaking with the correct person using two identifiers.  Patient Location: Home  Provider Location: Home Office  I discussed the limitations of evaluation and management by telemedicine. The patient expressed understanding and agreed to proceed.  Vital Signs: Because this visit was a virtual/telehealth visit, some criteria may be missing or patient reported. Any vitals not documented were not able to be obtained and vitals that have been documented are patient reported.  PHQ9:  Was not done today, as the patient could not quite answer the questions given, due to her condition.    Cardiac Risk Factors include: advanced age (>25men, >53 women);hypertension;Other (see comment);dyslipidemia, Risk factor comments: PAD, CAD, Dementia,asthma     Objective:    Today's Vitals   07/25/23 1414  Weight: 131 lb (59.4 kg)  Height: 5\' 4"  (1.626 m)   Body mass index is 22.49 kg/m.     07/25/2023    2:43 PM 01/17/2022    4:04 PM 09/12/2021    8:07 AM 08/23/2019    4:30 PM  Advanced Directives  Does Patient Have a Medical Advance Directive? No No No No  Would patient like information on creating a medical advance directive?  No - Patient declined No - Patient declined     Current Medications (verified) Outpatient Encounter Medications as of 07/25/2023  Medication Sig   albuterol (VENTOLIN HFA) 108 (90 Base) MCG/ACT inhaler Inhale 1 puff into the lungs every 4 (four) hours as needed for shortness of breath or wheezing.   b complex vitamins capsule Take 1 capsule by mouth daily.   bismuth subsalicylate (PEPTO BISMOL) 262 MG/15ML suspension Take 30 mLs by mouth every 6 (six) hours as needed for indigestion.   Cholecalciferol (D-3-5) 125 MCG  (5000 UT) capsule Take 5,000 Units by mouth daily.   citalopram (CELEXA) 10 MG tablet Take 10 mg by mouth daily.   ezetimibe (ZETIA) 10 MG tablet TAKE 1 TABLET(10 MG) BY MOUTH DAILY   KRILL OIL PO Take by mouth.   lidocaine (LIDODERM) 5 % Place 1 patch onto the skin daily as needed (pain). Remove & Discard patch within 12 hours or as directed by MD   metoprolol tartrate (LOPRESSOR) 25 MG tablet TAKE 1 TABLET(25 MG) BY MOUTH TWICE DAILY   Multiple Vitamin (MULTIVITAMIN) capsule Take 1 capsule by mouth daily.   nicotine polacrilex (NICORETTE) 2 MG gum Take 1 each (2 mg total) by mouth as needed for smoking cessation.   rosuvastatin (CRESTOR) 20 MG tablet TAKE 1 TABLET(20 MG) BY MOUTH DAILY   SYMBICORT 80-4.5 MCG/ACT inhaler Inhale 2 puffs into the lungs 2 (two) times daily.   traZODone (DESYREL) 50 MG tablet TAKE 1 TO 3 TABLETS BY MOUTH EVERY DAY AT BEDTIME AS NEEDED   Ubiquinol 50 MG CAPS Take by mouth.   No facility-administered encounter medications on file as of 07/25/2023.    Allergies (verified) Tolmetin, Codeine, Lovastatin, Milk-related compounds, Mirtazapine, Nsaids, Vilazodone hcl, and Oxycodone   History: Past Medical History:  Diagnosis Date   Asthma    on symbicort; rare albuterol use   BCC (basal cell carcinoma) 12/09/2012   right inner eye ([id number])   CAD (coronary artery disease)    Cataracts, bilateral    Compression fracture of fifth lumbar vertebra (  HCC)    Depression    Dysrhythmia    Extremity pain    High cholesterol    Past Surgical History:  Procedure Laterality Date   ABDOMINAL HYSTERECTOMY     AUGMENTATION MAMMAPLASTY Bilateral    BREAST ENHANCEMENT SURGERY     COLONOSCOPY     ESOPHAGEAL DILATION     EXCISION VAGINAL CYST     HEMORRHOID SURGERY     iterstim implant removal  2016   MASS EXCISION Left 09/12/2021   Procedure: EXCISION MASS LEFT NECK MASS;  Surgeon: Newman Pies, MD;  Location: [facility] OR;  Service: ENT;  Laterality: Left;   metal stimulator  for bowel control     Removed   NASAL SEPTOPLASTY W/ TURBINOPLASTY Bilateral 09/12/2021   Procedure: NASAL SEPTOPLASTY WITH TURBINATE REDUCTION;  Surgeon: Newman Pies, MD;  Location: [facility] OR;  Service: ENT;  Laterality: Bilateral;   NOSE SURGERY     Family History  Problem Relation Age of Onset   Other Mother        Intestinal problem - gangrene   Cancer Father        Bile Duct Cancer   Heart disease Father    Heart attack Father    Hypercholesterolemia Father    Bone cancer Sister    Bone cancer Brother    Colon cancer Neg Hx    Breast cancer Neg Hx    Ovarian cancer Neg Hx    Endometrial cancer Neg Hx    Pancreatic cancer Neg Hx    Prostate cancer Neg Hx    Social History   Socioeconomic History   Marital status: Divorced    Spouse name: Not on file   Number of children: 4   Years of education: 1 year of college   Highest education level: Not on file  Occupational History   Occupation: Retired  Tobacco Use   Smoking status: Every Day    Current packs/day: 0.50    Average packs/day: 0.5 packs/day for 44.8 years (22.4 ttl pk-yrs)    Types: Cigarettes    Start date: 10/08/1978    Passive exposure: Never   Smokeless tobacco: Never  Vaping Use   Vaping status: Never Used  Substance and Sexual Activity   Alcohol use: No   Drug use: No   Sexual activity: Not on file  Other Topics Concern   Not on file  Social History Narrative   Lives at home with her daughter.   Right-handed.   2-3 cups caffeine daily.   Cut back some on the smoking   Social Determinants of Health   Financial Resource Strain: Not on file  Food Insecurity: Not on file  Transportation Needs: Not on file  Physical Activity: Not on file  Stress: Not on file  Social Connections: Unknown (02/11/2022)   [facility]   Social Network    Social Network: Not on file    Tobacco Counseling Ready to quit: Not Answered Counseling given: Not Answered   Clinical  Intake:  Pre-visit preparation completed: Yes  Pain : No/denies pain     BMI - recorded: 22.49 Nutritional Status: BMI of 19-24  Normal Nutritional Risks: None Diabetes: No  How often do you need to have someone help you when you read instructions, pamphlets, or other written materials from your doctor or pharmacy?: 2 - Rarely  Interpreter Needed?: No  Comments: Daughter is helping with visit Information entered by :: Brenda Franklin, Brenda Franklin   Activities of Daily  Living    07/25/2023    2:15 PM  In your present state of health, do you have any difficulty performing the following activities:  Hearing? 1  Comment Wears hearing aide on Lt side/  Vision? 0  Difficulty concentrating or making decisions? 1  Walking or climbing stairs? 0  Dressing or bathing? 0  Doing errands, shopping? 0  Comment still drives sometimes/ daughter drives 90%  Preparing Food and eating ? N  Using the Toilet? N  In the past six months, have you accidently leaked urine? Y  Do you have problems with loss of bowel control? Y  Managing your Medications? N  Managing your Finances? N  Housekeeping or managing your Housekeeping? N    Patient Care Team: Pincus Sanes, MD as PCP - General (Internal Medicine)  Indicate any recent Medical Services you may have received from other than [facility] providers in the past year (date may be approximate).     Assessment:   This is a routine wellness examination for Brenda Franklin.  Hearing/Vision screen No results found.   Goals Addressed               This Visit's Progress     Patient Stated (pt-stated)        Would like to pick up walking more.      Depression Screen    03/26/2023   11:33 AM  PHQ 2/9 Scores  PHQ - 2 Score 2  PHQ- 9 Score 6    Fall Risk    07/25/2023    2:44 PM 03/26/2023   11:33 AM 08/24/2019    9:22 AM  Fall Risk   Falls in the past year? 0 0 0  Comment   Emmi Telephone Survey: data to providers prior to load  Number falls in  past yr: 0 0   Injury with Fall? 0 0   Risk for fall due to : No Fall Risks No Fall Risks   Follow up Falls evaluation completed;Falls prevention discussed Falls evaluation completed     MEDICARE RISK AT HOME: Medicare Risk at Home Any stairs in or around the home?: Yes (front nad back porch) If so, are there any without handrails?: Yes Home free of loose throw rugs in walkways, pet beds, electrical cords, etc?: Yes Adequate lighting in your home to reduce risk of falls?: Yes Life alert?: No Use of a cane, walker or w/c?: No Grab bars in the bathroom?: No Shower chair or bench in shower?: Yes Elevated toilet seat or a handicapped toilet?: No  TIMED UP AND GO:  Was the test performed?  No    Cognitive Function:        Immunizations Immunization History  Administered Date(s) Administered   PFIZER(Purple Top)SARS-COV-2 Vaccination 11/29/2019, 12/30/2019   Rabies, IM 12/05/2015, 12/05/2015, 12/07/2015, 12/11/2015, 12/18/2015    TDAP status: Due, Education has been provided regarding the importance of this vaccine. Advised may receive this vaccine at local pharmacy or Health Dept. Aware to provide a copy of the vaccination record if obtained from local pharmacy or Health Dept. Verbalized acceptance and understanding.  Flu Vaccine status: Declined, Education has been provided regarding the importance of this vaccine but patient still declined. Advised may receive this vaccine at local pharmacy or Health Dept. Aware to provide a copy of the vaccination record if obtained from local pharmacy or Health Dept. Verbalized acceptance and understanding.  Pneumococcal vaccine status: Up to date  Covid-19 vaccine status: Completed vaccines  Qualifies for Shingles  Vaccine? Yes   Zostavax completed Yes   Shingrix Completed?: No.    Education has been provided regarding the importance of this vaccine. Patient has been advised to call insurance company to determine out of pocket expense if  they have not yet received this vaccine. Advised may also receive vaccine at local pharmacy or Health Dept. Verbalized acceptance and understanding.  Screening Tests Health Maintenance  Topic Date Due   Pneumonia Vaccine 76+ Years old (1 of 2 - PCV) Never done   Hepatitis C Screening  Never done   DTaP/Tdap/Td (1 - Tdap) Never done   Zoster Vaccines- Shingrix (1 of 2) Never done   DEXA SCAN  Never done   Lung Cancer Screening  08/09/2021   COVID-19 Vaccine (3 - 2023-24 season) 06/01/2023   Medicare Annual Wellness (AWV)  07/24/2024   HPV VACCINES  Aged Out   INFLUENZA VACCINE  Discontinued    Health Maintenance  Health Maintenance Due  Topic Date Due   Pneumonia Vaccine 32+ Years old (1 of 2 - PCV) Never done   Hepatitis C Screening  Never done   DTaP/Tdap/Td (1 - Tdap) Never done   Zoster Vaccines- Shingrix (1 of 2) Never done   DEXA SCAN  Never done   Lung Cancer Screening  08/09/2021   COVID-19 Vaccine (3 - 2023-24 season) 06/01/2023    Colorectal cancer screening: No longer required.   Mammogram status: No longer required due to age.  Bone Density status: Ordered 07/25/2023. Pt provided with contact info and advised to call to schedule appt.  Lung Cancer Screening: (Low Dose CT Chest recommended if Age 68-80 years, 20 pack-year currently smoking OR have quit w/in 15years.) does qualify.   Lung Cancer Screening Referral: 07/25/2023  Additional Screening:  Hepatitis C Screening: does qualify;   Vision Screening: Recommended annual ophthalmology exams for early detection of glaucoma and other disorders of the eye. Is the patient up to date with their annual eye exam?  Yes  Who is the provider or what is the name of the office in which the patient attends annual eye exams? Dr. Gerri Spore If pt is not established with a provider, would they like to be referred to a provider to establish care? Yes .   Dental Screening: Recommended annual dental exams for proper  oral hygiene    Community Resource Referral / Chronic Care Management: CRR required this visit?  No   CCM required this visit?  No     Plan:     I have personally reviewed and noted the following in the patient's chart:   Medical and social history Use of alcohol, tobacco or illicit drugs  Current medications and supplements including opioid prescriptions. Patient is not currently taking opioid prescriptions. Functional ability and status Nutritional status Physical activity Advanced directives List of other physicians Hospitalizations, surgeries, and ER visits in previous 12 months Vitals Screenings to include cognitive, depression, and falls Referrals and appointments  In addition, I have reviewed and discussed with patient certain preventive protocols, quality metrics, and best practice recommendations. A written personalized care plan for preventive services as well as general preventive health recommendations were provided to patient.     Brenda Franklin, CMA   07/25/2023   After Visit Summary: (Mail) Due to this being a telephonic visit, the after visit summary with patients personalized plan was offered to patient via mail   Nurse Notes: Patient's daughter help patient with visit today.  Patient is due for a  DEXA and a lung cancer screening.  I placed orders for both and patient's daughter is aware that she has to call and schedule appointments.  She is also due for a Tdap and Shingrix vaccine.  Patient or her daughter had no other concerns to address today.

## 2023-07-26 NOTE — Progress Notes (Signed)
Medical screening examination/treatment/procedure(s) were performed by non-physician practitioner and as supervising physician I was immediately available for consultation/collaboration.  I agree with above. Alesia Oshields J Ima Hafner, MD  

## 2023-07-29 ENCOUNTER — Other Ambulatory Visit: Payer: Self-pay | Admitting: Cardiology

## 2023-07-29 DIAGNOSIS — I2510 Atherosclerotic heart disease of native coronary artery without angina pectoris: Secondary | ICD-10-CM

## 2023-07-29 DIAGNOSIS — E782 Mixed hyperlipidemia: Secondary | ICD-10-CM

## 2023-08-11 DIAGNOSIS — K635 Polyp of colon: Secondary | ICD-10-CM | POA: Diagnosis not present

## 2023-08-31 ENCOUNTER — Encounter: Payer: Self-pay | Admitting: Internal Medicine

## 2023-08-31 DIAGNOSIS — E673 Hypervitaminosis D: Secondary | ICD-10-CM | POA: Insufficient documentation

## 2023-08-31 NOTE — Progress Notes (Unsigned)
      Subjective:    Patient ID: Brenda Franklin, female    DOB: 1946-06-08, 77 y.o.   MRN: [id number]     HPI Verbal is here for follow up of her chronic medical problems.  ? Memory - stopped supp for diarrhea - ? Dementia - ? Further eval  Dec gfr x 2  Medications and allergies reviewed with patient and updated if appropriate.  Current Outpatient Medications on File Prior to Visit  Medication Sig Dispense Refill   albuterol (VENTOLIN HFA) 108 (90 Base) MCG/ACT inhaler Inhale 1 puff into the lungs every 4 (four) hours as needed for shortness of breath or wheezing.     b complex vitamins capsule Take 1 capsule by mouth daily.     bismuth subsalicylate (PEPTO BISMOL) 262 MG/15ML suspension Take 30 mLs by mouth every 6 (six) hours as needed for indigestion.     Cholecalciferol (D-3-5) 125 MCG (5000 UT) capsule Take 5,000 Units by mouth daily.     citalopram (CELEXA) 10 MG tablet Take 10 mg by mouth daily.     ezetimibe (ZETIA) 10 MG tablet TAKE 1 TABLET(10 MG) BY MOUTH DAILY 90 tablet 2   KRILL OIL PO Take by mouth.     lidocaine (LIDODERM) 5 % Place 1 patch onto the skin daily as needed (pain). Remove & Discard patch within 12 hours or as directed by MD     metoprolol tartrate (LOPRESSOR) 25 MG tablet TAKE 1 TABLET(25 MG) BY MOUTH TWICE DAILY 180 tablet 2   Multiple Vitamin (MULTIVITAMIN) capsule Take 1 capsule by mouth daily.     nicotine polacrilex (NICORETTE) 2 MG gum Take 1 each (2 mg total) by mouth as needed for smoking cessation. 100 tablet 1   rosuvastatin (CRESTOR) 20 MG tablet TAKE 1 TABLET(20 MG) BY MOUTH DAILY 30 tablet 3   SYMBICORT 80-4.5 MCG/ACT inhaler Inhale 2 puffs into the lungs 2 (two) times daily. 30.6 g 2   traZODone (DESYREL) 50 MG tablet TAKE 1 TO 3 TABLETS BY MOUTH EVERY DAY AT BEDTIME AS NEEDED 270 tablet 1   Ubiquinol 50 MG CAPS Take by mouth.     No current facility-administered medications on file prior to visit.     Review of Systems     Objective:   There were no vitals filed for this visit. BP Readings from Last 3 Encounters:  07/22/23 119/69  05/19/23 132/65  03/26/23 114/60   Wt Readings from Last 3 Encounters:  07/25/23 131 lb (59.4 kg)  07/22/23 131 lb 3.2 oz (59.5 kg)  05/19/23 132 lb 3.2 oz (60 kg)   There is no height or weight on file to calculate BMI.    Physical Exam     Lab Results  Component Value Date   WBC 6.6 03/26/2023   HGB 13.3 03/26/2023   HCT 40.5 03/26/2023   PLT 181.0 03/26/2023   GLUCOSE 85 03/26/2023   CHOL 180 03/26/2023   TRIG 151.0 (H) 03/26/2023   HDL 67.00 03/26/2023   LDLCALC 83 03/26/2023   ALT 18 03/26/2023   AST 20 03/26/2023   NA 140 03/26/2023   K 4.3 03/26/2023   CL 103 03/26/2023   CREATININE 0.99 03/26/2023   BUN 19 03/26/2023   CO2 29 03/26/2023   HGBA1C 6.1 03/26/2023     Assessment & Plan:    See Problem List for Assessment and Plan of chronic medical problems.

## 2023-08-31 NOTE — Patient Instructions (Addendum)
      Blood work was ordered.       Medications changes include :   None    A referral was ordered and someone will call you to schedule an appointment.     Return in about 6 months (around 03/03/2024) for follow up.

## 2023-09-03 ENCOUNTER — Ambulatory Visit (INDEPENDENT_AMBULATORY_CARE_PROVIDER_SITE_OTHER): Payer: PPO | Admitting: Internal Medicine

## 2023-09-03 VITALS — BP 122/74 | HR 66 | Temp 98.2°F | Ht 64.0 in | Wt 131.0 lb

## 2023-09-03 DIAGNOSIS — J4530 Mild persistent asthma, uncomplicated: Secondary | ICD-10-CM

## 2023-09-03 DIAGNOSIS — E673 Hypervitaminosis D: Secondary | ICD-10-CM

## 2023-09-03 DIAGNOSIS — F0390 Unspecified dementia without behavioral disturbance: Secondary | ICD-10-CM | POA: Diagnosis not present

## 2023-09-03 DIAGNOSIS — I2510 Atherosclerotic heart disease of native coronary artery without angina pectoris: Secondary | ICD-10-CM | POA: Diagnosis not present

## 2023-09-03 DIAGNOSIS — I119 Hypertensive heart disease without heart failure: Secondary | ICD-10-CM

## 2023-09-03 DIAGNOSIS — I10 Essential (primary) hypertension: Secondary | ICD-10-CM | POA: Diagnosis not present

## 2023-09-03 DIAGNOSIS — E782 Mixed hyperlipidemia: Secondary | ICD-10-CM

## 2023-09-03 DIAGNOSIS — M549 Dorsalgia, unspecified: Secondary | ICD-10-CM | POA: Diagnosis not present

## 2023-09-03 DIAGNOSIS — R7303 Prediabetes: Secondary | ICD-10-CM

## 2023-09-03 DIAGNOSIS — G8929 Other chronic pain: Secondary | ICD-10-CM | POA: Diagnosis not present

## 2023-09-03 DIAGNOSIS — F5104 Psychophysiologic insomnia: Secondary | ICD-10-CM | POA: Diagnosis not present

## 2023-09-03 MED ORDER — TRAZODONE HCL 50 MG PO TABS: ORAL_TABLET | ORAL | 1 refills | Status: DC

## 2023-09-03 MED ORDER — LIDOCAINE 5 % EX PTCH: 1.0000 | MEDICATED_PATCH | Freq: Every day | CUTANEOUS | 5 refills | Status: DC | PRN

## 2023-09-03 NOTE — Assessment & Plan Note (Signed)
Chronic Controlled-stable Following with pulmonary Continue Symbicort 80-4.5 mcg 2 puffs twice daily, albuterol as needed

## 2023-09-03 NOTE — Assessment & Plan Note (Addendum)
Chronic Blood pressure well controlled Continue metoprolol 25 mg twice daily 

## 2023-09-03 NOTE — Assessment & Plan Note (Addendum)
Chronic Not formally diagnosed Will check B12, TSH with next blood work

## 2023-09-03 NOTE — Assessment & Plan Note (Signed)
Chronic No chest pain, shortness of breath or palpitations Follows with cardiology Continue Zetia 10 mg daily, metoprolol 25 mg twice daily, Crestor 20 mg daily

## 2023-09-03 NOTE — Assessment & Plan Note (Signed)
Chronic Blood pressure well-controlled CMP, CBC Continue metoprolol 25 mg twice daily

## 2023-09-03 NOTE — Assessment & Plan Note (Addendum)
History of high vitamin D level Has stopped supplementation

## 2023-09-03 NOTE — Assessment & Plan Note (Addendum)
Chronic Lab Results  Component Value Date   HGBA1C 6.1 03/26/2023   Low sugar / carb diet Stressed regular exercise

## 2023-09-03 NOTE — Assessment & Plan Note (Signed)
Chronic Using lidocaine patches 5% for the pain which does help Prescription refilled

## 2023-09-03 NOTE — Assessment & Plan Note (Addendum)
Chronic Following with cardiology Continue Zetia 10 mg daily, rosuvastatin 20 mg daily

## 2023-09-03 NOTE — Assessment & Plan Note (Signed)
Chronic Controlled, Stable Continue trazodone 50-150 mg nightly

## 2023-09-17 ENCOUNTER — Ambulatory Visit (HOSPITAL_COMMUNITY)
Admission: RE | Admit: 2023-09-17 | Discharge: 2023-09-17 | Disposition: A | Discharge status: Home / Self Care | Payer: PPO | Source: Ambulatory Visit | Attending: Internal Medicine | Admitting: Internal Medicine

## 2023-09-17 DIAGNOSIS — F17210 Nicotine dependence, cigarettes, uncomplicated: Secondary | ICD-10-CM | POA: Diagnosis not present

## 2023-09-17 DIAGNOSIS — Z122 Encounter for screening for malignant neoplasm of respiratory organs: Secondary | ICD-10-CM | POA: Diagnosis not present

## 2023-09-17 DIAGNOSIS — Z0000 Encounter for general adult medical examination without abnormal findings: Secondary | ICD-10-CM | POA: Diagnosis present

## 2023-10-03 DIAGNOSIS — D120 Benign neoplasm of cecum: Secondary | ICD-10-CM | POA: Diagnosis not present

## 2023-10-03 DIAGNOSIS — K5730 Diverticulosis of large intestine without perforation or abscess without bleeding: Secondary | ICD-10-CM | POA: Diagnosis not present

## 2023-10-03 DIAGNOSIS — K635 Polyp of colon: Secondary | ICD-10-CM | POA: Diagnosis not present

## 2023-10-03 DIAGNOSIS — F32A Depression, unspecified: Secondary | ICD-10-CM | POA: Diagnosis not present

## 2023-10-03 DIAGNOSIS — F17210 Nicotine dependence, cigarettes, uncomplicated: Secondary | ICD-10-CM | POA: Diagnosis not present

## 2023-10-03 DIAGNOSIS — D122 Benign neoplasm of ascending colon: Secondary | ICD-10-CM | POA: Diagnosis not present

## 2023-10-03 DIAGNOSIS — Z79899 Other long term (current) drug therapy: Secondary | ICD-10-CM | POA: Diagnosis not present

## 2023-10-05 ENCOUNTER — Encounter: Payer: Self-pay | Admitting: Internal Medicine

## 2023-10-05 DIAGNOSIS — R911 Solitary pulmonary nodule: Secondary | ICD-10-CM | POA: Insufficient documentation

## 2023-10-27 DIAGNOSIS — Z1231 Encounter for screening mammogram for malignant neoplasm of breast: Secondary | ICD-10-CM | POA: Diagnosis not present

## 2023-10-27 LAB — HM MAMMOGRAPHY

## 2023-10-29 ENCOUNTER — Encounter: Payer: Self-pay | Admitting: Internal Medicine

## 2023-11-15 ENCOUNTER — Other Ambulatory Visit: Payer: Self-pay | Admitting: Cardiology

## 2023-11-15 DIAGNOSIS — I2510 Atherosclerotic heart disease of native coronary artery without angina pectoris: Secondary | ICD-10-CM

## 2023-12-10 ENCOUNTER — Other Ambulatory Visit: Payer: Self-pay

## 2023-12-10 DIAGNOSIS — I719 Aortic aneurysm of unspecified site, without rupture: Secondary | ICD-10-CM

## 2023-12-15 NOTE — Progress Notes (Deleted)
 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  She was previously being worked up for shortness of breath by her cardiologist Dr. Rosemary Holms and had a CT chest on 08/10/2020 where there was concern for a dissection flap in the infrarenal abdominal aorta.  Further work-up with a CTA on 10/03/2020 showed a PAU arising from the left lateral wall of the abdominal aorta with a maximal diameter of 2.4 cm.    We have been following this on a yearly basis.  On last evaluation this had a maximal diameter of 2.5 cm.  Past Medical History:  Diagnosis Date   Asthma    on symbicort; rare albuterol use   BCC (basal cell carcinoma) 12/09/2012   right inner eye ([id number])   CAD (coronary artery disease)    Cataracts, bilateral    Compression fracture of fifth lumbar vertebra (HCC)    Depression    Dysrhythmia    Extremity pain    High cholesterol     Past Surgical History:  Procedure Laterality Date   ABDOMINAL HYSTERECTOMY     AUGMENTATION MAMMAPLASTY Bilateral    BREAST ENHANCEMENT SURGERY     COLONOSCOPY     ESOPHAGEAL DILATION     EXCISION VAGINAL CYST     HEMORRHOID SURGERY     iterstim implant removal  2016   MASS EXCISION Left 09/12/2021   Procedure: EXCISION MASS LEFT NECK MASS;  Surgeon: Newman Pies, MD;  Location: [facility] OR;  Service: ENT;  Laterality: Left;   metal stimulator for bowel control     Removed   NASAL SEPTOPLASTY W/ TURBINOPLASTY Bilateral 09/12/2021   Procedure: NASAL SEPTOPLASTY WITH TURBINATE REDUCTION;  Surgeon: Newman Pies, MD;  Location: [facility] OR;  Service: ENT;  Laterality: Bilateral;   NOSE SURGERY      Family History  Problem Relation Age of Onset   Other Mother        Intestinal problem - gangrene    Cancer Father        Bile Duct Cancer   Heart disease Father    Heart attack Father    Hypercholesterolemia Father    Bone cancer Sister    Bone cancer Brother    Colon cancer Neg Hx    Breast cancer Neg Hx    Ovarian cancer Neg Hx    Endometrial cancer Neg Hx    Pancreatic cancer Neg Hx    Prostate cancer Neg Hx     SOCIAL HISTORY: Social History   Socioeconomic History   Marital status: Divorced    Spouse name: Not on file   Number of children: 4   Years of education: 1 year of college   Highest education level: Not on file  Occupational History   Occupation: Retired  Tobacco Use   Smoking status: Every Day    Current packs/day: 0.50    Average packs/day: 0.5 packs/day for 45.2 years (22.6 ttl pk-yrs)    Types: Cigarettes    Start date: 10/08/1978    Passive exposure: Never   Smokeless tobacco: Never  Vaping Use   Vaping status: Never Used  Substance and Sexual Activity   Alcohol use: No   Drug use: No   Sexual activity: Not on file  Other Topics Concern  Not on file  Social History Narrative   Lives at home with her daughter.   Right-handed.   2-3 cups caffeine daily.   Cut back some on the smoking   Social Drivers of Health   Financial Resource Strain: Medium Risk (07/25/2023)   Overall Financial Resource Strain (CARDIA)    Difficulty of Paying Living Expenses: Somewhat hard  Food Insecurity: No Food Insecurity (07/25/2023)   Hunger Vital Sign    Worried About Running Out of Food in the Last Year: Never true    Ran Out of Food in the Last Year: Never true  Transportation Needs: No Transportation Needs (07/25/2023)   PRAPARE - Administrator, Civil Service (Medical): No    Lack of Transportation (Non-Medical): No  Physical Activity: Inactive (07/25/2023)   Exercise Vital Sign    Days of Exercise per Week: 0 days    Minutes of Exercise per Session: 0 min  Stress: No Stress Concern Present (07/25/2023)   [employer] of Occupational  Health - Occupational Stress Questionnaire    Feeling of Stress : Not at all  Social Connections: Socially Isolated (07/25/2023)   Social Connection and Isolation Panel [NHANES]    Frequency of Communication with Friends and Family: Three times a week    Frequency of Social Gatherings with Friends and Family: Never    Attends Religious Services: Never    Database administrator or Organizations: No    Attends Banker Meetings: Never    Marital Status: Divorced  Catering manager Violence: Not At Risk (07/25/2023)   Humiliation, Afraid, Rape, and Kick questionnaire    Fear of Current or Ex-Partner: No    Emotionally Abused: No    Physically Abused: No    Sexually Abused: No    Allergies  Allergen Reactions   Tolmetin Nausea And Vomiting   Codeine Nausea And Vomiting and Other (See Comments)    Upset stomach   Lovastatin     Other reaction(s): myalgia   Milk-Related Compounds Diarrhea   Mirtazapine     Other reaction(s): loose stool, too sedating   Nsaids Diarrhea and Other (See Comments)    Stomach upset   Vilazodone Hcl     Other reaction(s): hallucination w 20mg  dose   Oxycodone Nausea And Vomiting    Current Outpatient Medications  Medication Sig Dispense Refill   albuterol (VENTOLIN HFA) 108 (90 Base) MCG/ACT inhaler Inhale 1 puff into the lungs every 4 (four) hours as needed for shortness of breath or wheezing.     b complex vitamins capsule Take 1 capsule by mouth daily.     bismuth subsalicylate (PEPTO BISMOL) 262 MG/15ML suspension Take 30 mLs by mouth every 6 (six) hours as needed for indigestion.     Cholecalciferol (D-3-5) 125 MCG (5000 UT) capsule Take 5,000 Units by mouth daily.     citalopram (CELEXA) 10 MG tablet Take 10 mg by mouth daily.     ezetimibe (ZETIA) 10 MG tablet TAKE 1 TABLET(10 MG) BY MOUTH DAILY 90 tablet 2   KRILL OIL PO Take by mouth.     lidocaine (LIDODERM) 5 % Place 1 patch onto the skin daily as needed (pain). Remove & Discard  patch within 12 hours or as directed by MD 30 patch 5   metoprolol tartrate (LOPRESSOR) 25 MG tablet TAKE 1 TABLET(25 MG) BY MOUTH TWICE DAILY 180 tablet 2   Multiple Vitamin (MULTIVITAMIN) capsule Take 1 capsule by mouth daily.     nicotine  polacrilex (NICORETTE) 2 MG gum Take 1 each (2 mg total) by mouth as needed for smoking cessation. 100 tablet 1   rosuvastatin (CRESTOR) 20 MG tablet TAKE 1 TABLET(20 MG) BY MOUTH DAILY 30 tablet 3   SYMBICORT 80-4.5 MCG/ACT inhaler Inhale 2 puffs into the lungs 2 (two) times daily. 30.6 g 2   traZODone (DESYREL) 50 MG tablet TAKE 1 TO 3 TABLETS BY MOUTH EVERY DAY AT BEDTIME AS NEEDED 270 tablet 1   Ubiquinol 50 MG CAPS Take by mouth.     No current facility-administered medications for this visit.    REVIEW OF SYSTEMS:  [X]  denotes positive finding, [ ]  denotes negative finding Cardiac  Comments:  Chest pain or chest pressure:    Shortness of breath upon exertion:    Short of breath when lying flat:    Irregular heart rhythm:        Vascular    Pain in calf, thigh, or hip brought on by ambulation:    Pain in feet at night that wakes you up from your sleep:     Blood clot in your veins:    Leg swelling:         Pulmonary    Oxygen at home:    Productive cough:     Wheezing:         Neurologic    Sudden weakness in arms or legs:     Sudden numbness in arms or legs:     Sudden onset of difficulty speaking or slurred speech:    Temporary loss of vision in one eye:     Problems with dizziness:         Gastrointestinal    Blood in stool:     Vomited blood:         Genitourinary    Burning when urinating:     Blood in urine:        Psychiatric    Major depression:         Hematologic    Bleeding problems:    Problems with blood clotting too easily:        Skin    Rashes or ulcers:        Constitutional    Fever or chills:      PHYSICAL EXAM: There were no vitals filed for this visit.   GENERAL: The patient is a  well-nourished female, in no acute distress. The vital signs are documented above. CARDIAC: There is a regular rate and rhythm.  VASCULAR:  Palpable femoral pulses bilaterally PULMONARY: No respiratory distress. ABDOMEN: Soft and non-tender.  No pain with deep palpation. MUSCULOSKELETAL: There are no major deformities or cyanosis. NEUROLOGIC: No focal weakness or paresthesias are detected. SKIN: There are no ulcers or rashes noted. PSYCHIATRIC: The patient has a normal affect.  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  The last year this is increased from 2.5 cm in maximal diameter to   Discussed no indications for surgical intervention at this time.   Cephus Shelling, MD [facility] Office: [phone]

## 2023-12-16 ENCOUNTER — Ambulatory Visit (HOSPITAL_COMMUNITY): Admission: RE | Admit: 2023-12-16 | Payer: PPO | Source: Ambulatory Visit

## 2023-12-16 ENCOUNTER — Ambulatory Visit: Payer: PPO | Admitting: Vascular Surgery

## 2024-01-01 DIAGNOSIS — Z961 Presence of intraocular lens: Secondary | ICD-10-CM | POA: Diagnosis not present

## 2024-01-01 DIAGNOSIS — H5703 Miosis: Secondary | ICD-10-CM | POA: Diagnosis not present

## 2024-01-01 DIAGNOSIS — H35033 Hypertensive retinopathy, bilateral: Secondary | ICD-10-CM | POA: Diagnosis not present

## 2024-01-01 DIAGNOSIS — H04123 Dry eye syndrome of bilateral lacrimal glands: Secondary | ICD-10-CM | POA: Diagnosis not present

## 2024-01-01 DIAGNOSIS — H353131 Nonexudative age-related macular degeneration, bilateral, early dry stage: Secondary | ICD-10-CM | POA: Diagnosis not present

## 2024-02-11 ENCOUNTER — Telehealth: Payer: Self-pay

## 2024-02-11 DIAGNOSIS — I2510 Atherosclerotic heart disease of native coronary artery without angina pectoris: Secondary | ICD-10-CM

## 2024-02-11 MED ORDER — ROSUVASTATIN CALCIUM 5 MG PO TABS: 5.0000 mg | ORAL_TABLET | Freq: Every day | ORAL | 0 refills | Status: DC

## 2024-02-11 NOTE — Telephone Encounter (Signed)
 This patient is appearing on a report for being at risk of failing the adherence measure for cholesterol (statin) medications this calendar year.   Medication: rosuvastatin  20 mg daily Last fill date: 11/12/23 for 30 day supply  Spoke with daughter who helps patient with her medications. Patient stopped taking rosuvastatin  due to leg weakness. Daughter reports the leg weakness did not resolve upon discontinuation of rosuvastatin . Discussed the option to restart the rosuvastatin  at a lower dose and daughter was agreeable but would like to resume at the lowest dose possible. Will collaborate with embedded pharmacist to send rosuvastatin  5mg  to [facility] on [address].  Next PCP visit on 03/03/24 - daughter to inform Dr. Donnette Gal of leg weakness and let her know if it worsened upon resumption of rosuvastatin .  Abelina Abide, PharmD PGY1 Pharmacy Resident 02/11/2024 9:08 AM

## 2024-02-20 ENCOUNTER — Telehealth: Payer: Self-pay

## 2024-02-20 NOTE — Telephone Encounter (Signed)
 This patient is appearing on a report for being at risk of failing the adherence measure for cholesterol (statin) medications this calendar year.   Medication: rosuvastatin  20 mg daily Last fill date: 11/12/23 for 30 day supply   Previously spoke with daughter to trial lower 5 mg dose due to leg weakness. Rosuvastatin  5 mg last filled 02/11/24 for 90ds and confirmed pick-up with daughter. No further action needed.  Abelina Abide, PharmD PGY1 Pharmacy Resident 02/20/2024 8:50 AM

## 2024-02-27 ENCOUNTER — Ambulatory Visit

## 2024-02-27 VITALS — Ht 64.5 in | Wt 131.0 lb

## 2024-02-27 DIAGNOSIS — Z0000 Encounter for general adult medical examination without abnormal findings: Secondary | ICD-10-CM

## 2024-02-27 DIAGNOSIS — H9193 Unspecified hearing loss, bilateral: Secondary | ICD-10-CM

## 2024-02-27 DIAGNOSIS — Z1159 Encounter for screening for other viral diseases: Secondary | ICD-10-CM | POA: Diagnosis not present

## 2024-02-27 NOTE — Patient Instructions (Addendum)
 Brenda Franklin , Thank you for taking time out of your busy schedule to complete your Annual Wellness Visit with me. I enjoyed our conversation and look forward to speaking with you again next year. I, as well as your care team,  appreciate your ongoing commitment to your health goals. Please review the following plan we discussed and let me know if I can assist you in the future. Your Game plan/ To Do List    Referrals: If you haven't heard from the office you've been referred to, please reach out to them at the phone provided.  Patient's daughter plans to contact her dentist, [facility] to schedule an appt for dentures; Patient's daughter also plans to schedule an appt w/Allison E. Anderson-Schmidt w/[facility] to evaluate her hearing and hearing aids devices.  Ordered a Hepatitis C Screening (lab) Follow up Visits: Next Medicare AWV with our clinical staff: 03/02/2025   Have you seen your provider in the last 6 months (3 months if uncontrolled diabetes)? Yes Next Office Visit with your provider: 03/03/2024  Clinician Recommendations:  Aim for 30 minutes of exercise or brisk walking, 6-8 glasses of water, and 5 servings of fruits and vegetables each day. Educated and advised on getting the Tdap (Tetenus) and Shingles vaccines in 2025 at local pharmacy.      This is a list of the screening recommended for you and due dates:  Health Maintenance  Topic Date Due   Hepatitis C Screening  Never done   DTaP/Tdap/Td vaccine (1 - Tdap) Never done   Zoster (Shingles) Vaccine (1 of 2) Never done   DEXA scan (bone density measurement)  Never done   Screening for Lung Cancer  09/16/2024   Medicare Annual Wellness Visit  02/26/2025   Pneumonia Vaccine  Completed   HPV Vaccine  Aged Out   Meningitis B Vaccine  Aged Out   Flu Shot  Discontinued   Colon Cancer Screening  Discontinued   COVID-19 Vaccine  Discontinued    Advanced directives: (Provided) Advance directive discussed with you  today. I have provided a copy for you to complete at home and have notarized. Once this is complete, please bring a copy in to our office so we can scan it into your chart.  Advance Care Planning is important because it:  [x]  Makes sure you receive the medical care that is consistent with your values, goals, and preferences  [x]  It provides guidance to your family and loved ones and reduces their decisional burden about whether or not they are making the right decisions based on your wishes.  Follow the link provided in your after visit summary or read over the paperwork we have mailed to you to help you started getting your Advance Directives in place. If you need assistance in completing these, please reach out to us  so that we can help you!

## 2024-02-27 NOTE — Progress Notes (Signed)
 Subjective:   Brenda Franklin is a 78 y.o. who presents for a Medicare Wellness preventive visit.  As a reminder, Annual Wellness Visits don't include a physical exam, and some assessments may be limited, especially if this visit is performed virtually. We may recommend an in-person follow-up visit with your provider if needed.  Visit Complete: Virtual I connected with  [name] on 02/27/24 by a audio enabled telemedicine application and verified that I am speaking with the correct person using two identifiers.  Patient Location: Home  Provider Location: Office/Clinic  I discussed the limitations of evaluation and management by telemedicine. The patient expressed understanding and agreed to proceed.  Vital Signs: Because this visit was a virtual/telehealth visit, some criteria may be missing or patient reported. Any vitals not documented were not able to be obtained and vitals that have been documented are patient reported.  VideoDeclined- This patient declined Librarian, academic. Therefore the visit was completed with audio only.  Persons Participating in Visit: Patient assisted by Ali Ink, daughter.  AWV Questionnaire: No: Patient Medicare AWV questionnaire was not completed prior to this visit.  Cardiac Risk Factors include: advanced age (>72men, >13 women);dyslipidemia;hypertension;smoking/ tobacco exposure (intermittent smoking)     Objective:     Today's Vitals   02/27/24 1018  Weight: 131 lb (59.4 kg)  Height: 5' 4.5" (1.638 m)   Body mass index is 22.14 kg/m.     02/27/2024   10:16 AM 07/25/2023    2:43 PM 01/17/2022    4:04 PM 09/12/2021    8:07 AM 08/23/2019    4:30 PM  Advanced Directives  Does Patient Have a Medical Advance Directive? No No No No No  Would patient like information on creating a medical advance directive? Yes (MAU/Ambulatory/Procedural Areas - Information given)  No - Patient declined No - Patient declined      Current Medications (verified) Outpatient Encounter Medications as of 02/27/2024  Medication Sig   albuterol  (VENTOLIN  HFA) 108 (90 Base) MCG/ACT inhaler Inhale 1 puff into the lungs every 4 (four) hours as needed for shortness of breath or wheezing.   b complex vitamins capsule Take 1 capsule by mouth daily.   bismuth subsalicylate (PEPTO BISMOL) 262 MG/15ML suspension Take 30 mLs by mouth every 6 (six) hours as needed for indigestion.   Cholecalciferol (D-3-5) 125 MCG (5000 UT) capsule Take 5,000 Units by mouth daily.   citalopram (CELEXA) 10 MG tablet Take 10 mg by mouth daily.   co-enzyme Q-10 30 MG capsule Take 30 mg by mouth 3 (three) times daily.   ezetimibe  (ZETIA ) 10 MG tablet TAKE 1 TABLET(10 MG) BY MOUTH DAILY   KRILL OIL PO Take by mouth.   lidocaine  (LIDODERM ) 5 % Place 1 patch onto the skin daily as needed (pain). Remove & Discard patch within 12 hours or as directed by MD   metoprolol  tartrate (LOPRESSOR ) 25 MG tablet TAKE 1 TABLET(25 MG) BY MOUTH TWICE DAILY   Multiple Vitamin (MULTIVITAMIN) capsule Take 1 capsule by mouth daily.   rosuvastatin  (CRESTOR ) 5 MG tablet Take 1 tablet (5 mg total) by mouth daily.   SYMBICORT  80-4.5 MCG/ACT inhaler Inhale 2 puffs into the lungs 2 (two) times daily.   traZODone  (DESYREL ) 50 MG tablet TAKE 1 TO 3 TABLETS BY MOUTH EVERY DAY AT BEDTIME AS NEEDED   nicotine  polacrilex (NICORETTE ) 2 MG gum Take 1 each (2 mg total) by mouth as needed for smoking cessation. (Patient not taking: Reported on 02/27/2024)  Ubiquinol 50 MG CAPS Take by mouth. (Patient not taking: Reported on 02/27/2024)   No facility-administered encounter medications on file as of 02/27/2024.    Allergies (verified) Tolmetin, Codeine, Lovastatin, Milk-related compounds, Mirtazapine, Nsaids, Vilazodone hcl, and Oxycodone    History: Past Medical History:  Diagnosis Date   Asthma    on symbicort ; rare albuterol  use   BCC (basal cell carcinoma) 12/09/2012   right inner  eye (Cx35FU)   CAD (coronary artery disease)    Cataracts, bilateral    Compression fracture of fifth lumbar vertebra (HCC)    Depression    Dysrhythmia    Extremity pain    High cholesterol    Past Surgical History:  Procedure Laterality Date   ABDOMINAL HYSTERECTOMY     AUGMENTATION MAMMAPLASTY Bilateral    BREAST ENHANCEMENT SURGERY     COLONOSCOPY     ESOPHAGEAL DILATION     EXCISION VAGINAL CYST     HEMORRHOID SURGERY     iterstim implant removal  2016   MASS EXCISION Left 09/12/2021   Procedure: EXCISION MASS LEFT NECK MASS;  Surgeon: Reynold Caves, MD;  Location: [facility] OR;  Service: ENT;  Laterality: Left;   metal stimulator for bowel control     Removed   NASAL SEPTOPLASTY W/ TURBINOPLASTY Bilateral 09/12/2021   Procedure: NASAL SEPTOPLASTY WITH TURBINATE REDUCTION;  Surgeon: Reynold Caves, MD;  Location: [facility] OR;  Service: ENT;  Laterality: Bilateral;   NOSE SURGERY     Family History  Problem Relation Age of Onset   Other Mother        Intestinal problem - gangrene   Cancer Father        Bile Duct Cancer   Heart disease Father    Heart attack Father    Hypercholesterolemia Father    Bone cancer Sister    Bone cancer Brother    Colon cancer Neg Hx    Breast cancer Neg Hx    Ovarian cancer Neg Hx    Endometrial cancer Neg Hx    Pancreatic cancer Neg Hx    Prostate cancer Neg Hx    Social History   Socioeconomic History   Marital status: Divorced    Spouse name: Not on file   Number of children: 4   Years of education: 1 year of college   Highest education level: Not on file  Occupational History   Occupation: Retired  Tobacco Use   Smoking status: Every Day    Current packs/day: 0.50    Average packs/day: 0.5 packs/day for 45.4 years (22.7 ttl pk-yrs)    Types: Cigarettes    Start date: 10/08/1978    Passive exposure: Current   Smokeless tobacco: Never  Vaping Use   Vaping status: Never Used  Substance and Sexual Activity   Alcohol use: No   Drug use: No    Sexual activity: Not Currently  Other Topics Concern   Not on file  Social History Narrative   Lives at home with her daughter.   Right-handed.   2-3 cups caffeine daily.   Cut back some on the smoking   Social Drivers of Corporate investment banker Strain: Low Risk  (02/27/2024)   Overall Financial Resource Strain (CARDIA)    Difficulty of Paying Living Expenses: Not hard at all  Food Insecurity: No Food Insecurity (02/27/2024)   Hunger Vital Sign    Worried About Running Out of Food in the Last Year: Never true    Ran Out of Food in  the Last Year: Never true  Transportation Needs: No Transportation Needs (02/27/2024)   PRAPARE - Administrator, Civil Service (Medical): No    Lack of Transportation (Non-Medical): No  Physical Activity: Insufficiently Active (02/27/2024)   Exercise Vital Sign    Days of Exercise per Week: 1 day    Minutes of Exercise per Session: 30 min  Stress: No Stress Concern Present (02/27/2024)   [employer] of Occupational Health - Occupational Stress Questionnaire    Feeling of Stress : Not at all  Social Connections: Socially Isolated (02/27/2024)   Social Connection and Isolation Panel [NHANES]    Frequency of Communication with Friends and Family: More than three times a week    Frequency of Social Gatherings with Friends and Family: Once a week    Attends Religious Services: Never    Database administrator or Organizations: No    Attends Engineer, structural: Never    Marital Status: Divorced    Tobacco Counseling Ready to quit: No Counseling given: Yes    Clinical Intake:  Pre-visit preparation completed: Yes  Pain : No/denies pain     BMI - recorded: 22.14 Nutritional Risks: None Diabetes: No  Lab Results  Component Value Date   HGBA1C 6.1 03/26/2023     How often do you need to have someone help you when you read instructions, pamphlets, or other written materials from your doctor or pharmacy?: 1 -  Never  Interpreter Needed?: No  Information entered by :: Kandy Orris, CMA   Activities of Daily Living     02/27/2024   10:28 AM 07/25/2023    2:15 PM  In your present state of health, do you have any difficulty performing the following activities:  Hearing? 1 1  Comment  Wears hearing aide on Lt side/  Vision? 0 0  Difficulty concentrating or making decisions? 0 1  Walking or climbing stairs? 0 0  Dressing or bathing? 0 0  Doing errands, shopping? 0 0  Comment  still drives sometimes/ daughter drives 90%  Preparing Food and eating ? N N  Using the Toilet? N N  In the past six months, have you accidently leaked urine? Y Y  Comment seldom - wears  depend   Do you have problems with loss of bowel control? Colie Dawes  Comment wears a depend   Managing your Medications? N N  Managing your Finances? N N  Housekeeping or managing your Housekeeping? N N    Patient Care Team: Colene Dauphin, MD as PCP - General (Internal Medicine) Aminta Kales, MD as Consulting Physician (Ophthalmology) Ronelle Coffee, MD as Consulting Physician (Ophthalmology)  Indicate any recent Medical Services you may have received from other than [facility] providers in the past year (date may be approximate).     Assessment:    This is a routine wellness examination for Estephanie.  Hearing/Vision screen Hearing Screening - Comments:: Wears hearing aids but plans to see Audiologist to readjust Vision Screening - Comments:: Wears eyeglasses for reading - up to date with routine eye exams with Dr Allison Ivory   Goals Addressed               This Visit's Progress     Patient Stated (pt-stated)        Patient stated she plans to continues to exercise       Depression Screen     02/27/2024   10:32 AM 09/03/2023    2:43 PM 07/25/2023  2:52 PM 03/26/2023   11:33 AM  PHQ 2/9 Scores  PHQ - 2 Score 0 0 0 2  PHQ- 9 Score 0 1  6  Exception Documentation   Medical reason     Fall Risk     02/27/2024   10:31 AM  09/03/2023    2:43 PM 07/25/2023    2:44 PM 03/26/2023   11:33 AM 08/24/2019    9:22 AM  Fall Risk   Falls in the past year? 0 0 0 0 0  Comment     Emmi Telephone Survey: data to providers prior to load  Number falls in past yr: 0 0 0 0   Injury with Fall? 0 0 0 0   Risk for fall due to : No Fall Risks No Fall Risks No Fall Risks No Fall Risks   Follow up Falls evaluation completed;Falls prevention discussed Falls evaluation completed Falls evaluation completed;Falls prevention discussed Falls evaluation completed     MEDICARE RISK AT HOME:  Medicare Risk at Home Any stairs in or around the home?: No If so, are there any without handrails?: No Home free of loose throw rugs in walkways, pet beds, electrical cords, etc?: Yes Adequate lighting in your home to reduce risk of falls?: Yes Life alert?: No Use of a cane, walker or w/c?: No Grab bars in the bathroom?: Yes Shower chair or bench in shower?: Yes Elevated toilet seat or a handicapped toilet?: Yes  TIMED UP AND GO:  Was the test performed?  No  Cognitive Function: 6CIT completed        02/27/2024   10:38 AM 07/25/2023    2:45 PM  6CIT Screen  What Year? 0 points 0 points  What month? 0 points 0 points  What time? 0 points 0 points  Count back from 20 0 points 0 points  Months in reverse 0 points 0 points  Repeat phrase 6 points 6 points  Total Score 6 points 6 points    Immunizations Immunization History  Administered Date(s) Administered   PFIZER(Purple Top)SARS-COV-2 Vaccination 11/29/2019, 12/30/2019   Pneumococcal Conjugate-13 05/05/2009   Pneumococcal Polysaccharide-23 05/05/2009   Rabies, IM 12/05/2015, 12/05/2015, 12/07/2015, 12/11/2015, 12/18/2015    Screening Tests Health Maintenance  Topic Date Due   Hepatitis C Screening  Never done   DTaP/Tdap/Td (1 - Tdap) Never done   Zoster Vaccines- Shingrix (1 of 2) Never done   DEXA SCAN  Never done   Lung Cancer Screening  09/16/2024   Medicare Annual  Wellness (AWV)  02/26/2025   Pneumonia Vaccine 13+ Years old  Completed   HPV VACCINES  Aged Out   Meningococcal B Vaccine  Aged Out   INFLUENZA VACCINE  Discontinued   Colonoscopy  Discontinued   COVID-19 Vaccine  Discontinued    Health Maintenance  Health Maintenance Due  Topic Date Due   Hepatitis C Screening  Never done   DTaP/Tdap/Td (1 - Tdap) Never done   Zoster Vaccines- Shingrix (1 of 2) Never done   DEXA SCAN  Never done   Health Maintenance Items Addressed:  DEXA scheduled, Hepatitis C Screening ordered  Patient's daughter plans to contact her dentist, Public affairs consultant to schedule an appt for dentures  Patient's daughter also plans to schedule an appt w/Allison E. Anderson-Schmidt w/[facility] to evaluate her hearing and hearing aids devices.  Additional Screening:  Vision Screening: Recommended annual ophthalmology exams for early detection of glaucoma and other disorders of the eye.  Dental Screening: Recommended  annual dental exams for proper oral hygiene  Community Resource Referral / Chronic Care Management: CRR required this visit?  No   CCM required this visit?  No   Plan:    I have personally reviewed and noted the following in the patient's chart:   Medical and social history Use of alcohol, tobacco or illicit drugs  Current medications and supplements including opioid prescriptions. Patient is not currently taking opioid prescriptions. Functional ability and status Nutritional status Physical activity Advanced directives List of other physicians Hospitalizations, surgeries, and ER visits in previous 12 months Vitals Screenings to include cognitive, depression, and falls Referrals and appointments  In addition, I have reviewed and discussed with patient certain preventive protocols, quality metrics, and best practice recommendations. A written personalized care plan for preventive services as well as general preventive health  recommendations were provided to patient.   Patria Bookbinder, CMA   02/27/2024   After Visit Summary: (Declined) Due to this being a telephonic visit, with patients personalized plan was offered to patient but patient Declined AVS at this time   Notes: Nothing significant to report at this time.

## 2024-03-02 ENCOUNTER — Encounter: Payer: Self-pay | Admitting: Internal Medicine

## 2024-03-02 DIAGNOSIS — E559 Vitamin D deficiency, unspecified: Secondary | ICD-10-CM | POA: Insufficient documentation

## 2024-03-02 NOTE — Progress Notes (Unsigned)
 Subjective:    Patient ID: Brenda Franklin, female    DOB: 04/06/1946, 78 y.o.   MRN: [id number]     HPI Brenda Franklin is here for follow up of her chronic medical problems.    Medications and allergies reviewed with patient and updated if appropriate.  Current Outpatient Medications on File Prior to Visit  Medication Sig Dispense Refill   albuterol  (VENTOLIN  HFA) 108 (90 Base) MCG/ACT inhaler Inhale 1 puff into the lungs every 4 (four) hours as needed for shortness of breath or wheezing.     b complex vitamins capsule Take 1 capsule by mouth daily.     bismuth subsalicylate (PEPTO BISMOL) 262 MG/15ML suspension Take 30 mLs by mouth every 6 (six) hours as needed for indigestion.     Cholecalciferol (D-3-5) 125 MCG (5000 UT) capsule Take 5,000 Units by mouth daily.     citalopram (CELEXA) 10 MG tablet Take 10 mg by mouth daily.     co-enzyme Q-10 30 MG capsule Take 30 mg by mouth 3 (three) times daily.     ezetimibe  (ZETIA ) 10 MG tablet TAKE 1 TABLET(10 MG) BY MOUTH DAILY 90 tablet 2   KRILL OIL PO Take by mouth.     lidocaine  (LIDODERM ) 5 % Place 1 patch onto the skin daily as needed (pain). Remove & Discard patch within 12 hours or as directed by MD 30 patch 5   metoprolol  tartrate (LOPRESSOR ) 25 MG tablet TAKE 1 TABLET(25 MG) BY MOUTH TWICE DAILY 180 tablet 2   Multiple Vitamin (MULTIVITAMIN) capsule Take 1 capsule by mouth daily.     nicotine  polacrilex (NICORETTE ) 2 MG gum Take 1 each (2 mg total) by mouth as needed for smoking cessation. (Patient not taking: Reported on 02/27/2024) 100 tablet 1   rosuvastatin  (CRESTOR ) 5 MG tablet Take 1 tablet (5 mg total) by mouth daily. 90 tablet 0   SYMBICORT  80-4.5 MCG/ACT inhaler Inhale 2 puffs into the lungs 2 (two) times daily. 30.6 g 2   traZODone  (DESYREL ) 50 MG tablet TAKE 1 TO 3 TABLETS BY MOUTH EVERY DAY AT BEDTIME AS NEEDED 270 tablet 1   Ubiquinol 50 MG CAPS Take by mouth. (Patient not taking: Reported on 02/27/2024)     No current  facility-administered medications on file prior to visit.     Review of Systems     Objective:  There were no vitals filed for this visit. BP Readings from Last 3 Encounters:  09/03/23 122/74  07/22/23 119/69  05/19/23 132/65   Wt Readings from Last 3 Encounters:  02/27/24 131 lb (59.4 kg)  09/03/23 131 lb (59.4 kg)  07/25/23 131 lb (59.4 kg)   There is no height or weight on file to calculate BMI.    Physical Exam     Lab Results  Component Value Date   WBC 6.6 03/26/2023   HGB 13.3 03/26/2023   HCT 40.5 03/26/2023   PLT 181.0 03/26/2023   GLUCOSE 85 03/26/2023   CHOL 180 03/26/2023   TRIG 151.0 (H) 03/26/2023   HDL 67.00 03/26/2023   LDLCALC 83 03/26/2023   ALT 18 03/26/2023   AST 20 03/26/2023   NA 140 03/26/2023   K 4.3 03/26/2023   CL 103 03/26/2023   CREATININE 0.99 03/26/2023   BUN 19 03/26/2023   CO2 29 03/26/2023   HGBA1C 6.1 03/26/2023     Assessment & Plan:    See Problem List for Assessment and Plan of chronic medical problems.

## 2024-03-02 NOTE — Patient Instructions (Addendum)
      Blood work was ordered.       Medications changes include :   None    A referral was ordered and someone will call you to schedule an appointment.     Return in about 6 months (around 09/02/2024) for Physical Exam.

## 2024-03-03 ENCOUNTER — Ambulatory Visit (INDEPENDENT_AMBULATORY_CARE_PROVIDER_SITE_OTHER): Payer: PPO | Admitting: Internal Medicine

## 2024-03-03 VITALS — BP 114/68 | HR 57 | Temp 98.7°F | Wt 130.0 lb

## 2024-03-03 DIAGNOSIS — E782 Mixed hyperlipidemia: Secondary | ICD-10-CM | POA: Diagnosis not present

## 2024-03-03 DIAGNOSIS — F32A Depression, unspecified: Secondary | ICD-10-CM | POA: Diagnosis not present

## 2024-03-03 DIAGNOSIS — F419 Anxiety disorder, unspecified: Secondary | ICD-10-CM | POA: Diagnosis not present

## 2024-03-03 DIAGNOSIS — F0390 Unspecified dementia without behavioral disturbance: Secondary | ICD-10-CM | POA: Diagnosis not present

## 2024-03-03 DIAGNOSIS — E559 Vitamin D deficiency, unspecified: Secondary | ICD-10-CM | POA: Diagnosis not present

## 2024-03-03 DIAGNOSIS — F5104 Psychophysiologic insomnia: Secondary | ICD-10-CM | POA: Diagnosis not present

## 2024-03-03 DIAGNOSIS — R7303 Prediabetes: Secondary | ICD-10-CM | POA: Diagnosis not present

## 2024-03-03 DIAGNOSIS — Z8639 Personal history of other endocrine, nutritional and metabolic disease: Secondary | ICD-10-CM | POA: Diagnosis not present

## 2024-03-03 DIAGNOSIS — I10 Essential (primary) hypertension: Secondary | ICD-10-CM

## 2024-03-03 LAB — LIPID PANEL
Cholesterol: 295 mg/dL — ABNORMAL HIGH (ref 0–200)
HDL: 67 mg/dL (ref >39.00)
LDL Cholesterol: 178 mg/dL — ABNORMAL HIGH (ref 0–99)
NonHDL: 227.67
Total CHOL/HDL Ratio: 4
Triglycerides: 247 mg/dL — ABNORMAL HIGH (ref 0.0–149.0)
VLDL: 49.4 mg/dL — ABNORMAL HIGH (ref 0.0–40.0)

## 2024-03-03 LAB — COMPREHENSIVE METABOLIC PANEL WITH GFR
ALT: 17 U/L (ref 0–35)
AST: 18 U/L (ref 0–37)
Albumin: 4.3 g/dL (ref 3.5–5.2)
Alkaline Phosphatase: 59 U/L (ref 39–117)
BUN: 27 mg/dL — ABNORMAL HIGH (ref 6–23)
CO2: 31 meq/L (ref 19–32)
Calcium: 9.4 mg/dL (ref 8.4–10.5)
Chloride: 103 meq/L (ref 96–112)
Creatinine, Ser: 0.97 mg/dL (ref 0.40–1.20)
GFR: 55.45 mL/min — ABNORMAL LOW (ref >60.00)
Glucose, Bld: 96 mg/dL (ref 70–99)
Potassium: 4.2 meq/L (ref 3.5–5.1)
Sodium: 140 meq/L (ref 135–145)
Total Bilirubin: 0.4 mg/dL (ref 0.2–1.2)
Total Protein: 6.8 g/dL (ref 6.0–8.3)

## 2024-03-03 LAB — CBC WITH DIFFERENTIAL/PLATELET
Basophils Absolute: 0 10*3/uL (ref 0.0–0.1)
Basophils Relative: 0.6 % (ref 0.0–3.0)
Eosinophils Absolute: 0.2 10*3/uL (ref 0.0–0.7)
Eosinophils Relative: 2.7 % (ref 0.0–5.0)
HCT: 39.5 % (ref 36.0–46.0)
Hemoglobin: 13.3 g/dL (ref 12.0–15.0)
Lymphocytes Relative: 39 % (ref 12.0–46.0)
Lymphs Abs: 2.5 10*3/uL (ref 0.7–4.0)
MCHC: 33.6 g/dL (ref 30.0–36.0)
MCV: 88.2 fl (ref 78.0–100.0)
Monocytes Absolute: 0.4 10*3/uL (ref 0.1–1.0)
Monocytes Relative: 6.6 % (ref 3.0–12.0)
Neutro Abs: 3.3 10*3/uL (ref 1.4–7.7)
Neutrophils Relative %: 51.1 % (ref 43.0–77.0)
Platelets: 194 10*3/uL (ref 150.0–400.0)
RBC: 4.48 Mil/uL (ref 3.87–5.11)
RDW: 12.8 % (ref 11.5–15.5)
WBC: 6.5 10*3/uL (ref 4.0–10.5)

## 2024-03-03 LAB — IBC PANEL
Iron: 50 ug/dL (ref 42–145)
Saturation Ratios: 16.8 % — ABNORMAL LOW (ref 20.0–50.0)
TIBC: 296.8 ug/dL (ref 250.0–450.0)
Transferrin: 212 mg/dL (ref 212.0–360.0)

## 2024-03-03 LAB — VITAMIN B12: Vitamin B-12: 671 pg/mL (ref 211–911)

## 2024-03-03 LAB — HEMOGLOBIN A1C: Hgb A1c MFr Bld: 5.8 % (ref 4.6–6.5)

## 2024-03-03 LAB — FERRITIN: Ferritin: 91.2 ng/mL (ref 10.0–291.0)

## 2024-03-03 LAB — VITAMIN D 25 HYDROXY (VIT D DEFICIENCY, FRACTURES): VITD: 60.82 ng/mL (ref 30.00–100.00)

## 2024-03-03 LAB — TSH: TSH: 2.99 u[IU]/mL (ref 0.35–5.50)

## 2024-03-03 NOTE — Assessment & Plan Note (Signed)
Chronic Controlled, Stable Continue Celexa 10 mg daily-I will prescribe when refill needed

## 2024-03-03 NOTE — Assessment & Plan Note (Signed)
 Chronic Controlled, Stable Continue trazodone 50-150 mg nightly

## 2024-03-03 NOTE — Assessment & Plan Note (Signed)
 History of iron deficiency Check iron levels, cbc

## 2024-03-03 NOTE — Assessment & Plan Note (Signed)
 Chronic Blood pressure well-controlled Cbc, cmp Continue metoprolol  25 mg twice daily

## 2024-03-03 NOTE — Assessment & Plan Note (Signed)
 Chronic Lab Results  Component Value Date   HGBA1C 5.8 03/03/2024   Low sugar / carb diet

## 2024-03-03 NOTE — Assessment & Plan Note (Signed)
 Chronic Taking vitamin d daily Check vitamin d level

## 2024-03-03 NOTE — Assessment & Plan Note (Signed)
 Chronic Not formally diagnosed Will check B12, TSH with next blood work

## 2024-03-03 NOTE — Assessment & Plan Note (Signed)
 Chronic Following with cardiology Not currently taking crestor  20 mg daily -- I do not think this is the cause of her legs symptoms and would recommend restarting Continue Zetia  10 mg daily Check lipids, cmp

## 2024-03-04 ENCOUNTER — Ambulatory Visit: Payer: Self-pay | Admitting: Internal Medicine

## 2024-03-10 ENCOUNTER — Ambulatory Visit (HOSPITAL_COMMUNITY)
Admission: RE | Admit: 2024-03-10 | Discharge: 2024-03-10 | Disposition: A | Discharge status: Home / Self Care | Payer: PPO | Source: Ambulatory Visit | Attending: Gynecologic Oncology | Admitting: Gynecologic Oncology

## 2024-03-10 DIAGNOSIS — Z90710 Acquired absence of both cervix and uterus: Secondary | ICD-10-CM | POA: Diagnosis not present

## 2024-03-10 DIAGNOSIS — N83202 Unspecified ovarian cyst, left side: Secondary | ICD-10-CM | POA: Insufficient documentation

## 2024-03-10 DIAGNOSIS — Z90721 Acquired absence of ovaries, unilateral: Secondary | ICD-10-CM | POA: Diagnosis not present

## 2024-03-16 ENCOUNTER — Other Ambulatory Visit (HOSPITAL_COMMUNITY)

## 2024-03-16 ENCOUNTER — Ambulatory Visit: Admitting: Vascular Surgery

## 2024-03-17 ENCOUNTER — Other Ambulatory Visit: Payer: PPO

## 2024-03-19 ENCOUNTER — Telehealth: Payer: Self-pay | Admitting: Gynecologic Oncology

## 2024-03-19 DIAGNOSIS — N83202 Unspecified ovarian cyst, left side: Secondary | ICD-10-CM

## 2024-03-19 NOTE — Telephone Encounter (Signed)
 I called the patient's daughter Anabel Kapur to discuss recent ultrasound results.  Notes that her mother is doing well, no new symptoms.  Discussed stable size and appearance of the cystic left ovarian mass.  Discussed options moving forward and decision made to proceed with repeat ultrasound in 1 year.  Wiley Hanger MD Gynecologic Oncology

## 2024-03-22 ENCOUNTER — Telehealth: Payer: Self-pay

## 2024-03-22 NOTE — Telephone Encounter (Signed)
 Spoke with [name] patient's daughter who returned call from office.  [name] was given the date for her mother's ultrasound for next year scheduled for Friday, March 11, 2025 at 2 pm [facility] with arrival time of 1:45 pm with full bladder. [name] verbalized understanding and thanked the office.

## 2024-03-22 NOTE — Telephone Encounter (Signed)
 Per Dr.Tucker, I reached out to Ms.Popowski daughter, Graeme. Left a voicemail for her to call office so we can get an ultrasound scheduled in one year.

## 2024-04-20 ENCOUNTER — Other Ambulatory Visit (HOSPITAL_BASED_OUTPATIENT_CLINIC_OR_DEPARTMENT_OTHER)

## 2024-04-26 ENCOUNTER — Other Ambulatory Visit: Payer: Self-pay | Admitting: Internal Medicine

## 2024-05-14 ENCOUNTER — Telehealth: Payer: Self-pay | Admitting: Cardiology

## 2024-05-14 NOTE — Telephone Encounter (Signed)
 Pt requesting provider switch to Dr. Francyne if possible. Please advise

## 2024-05-14 NOTE — Telephone Encounter (Signed)
 I will miss seeing her, but respect patient's wishes.  Regards, Dr. Elmira

## 2024-05-14 NOTE — Telephone Encounter (Signed)
No objections

## 2024-05-18 ENCOUNTER — Ambulatory Visit (HOSPITAL_COMMUNITY)

## 2024-05-18 ENCOUNTER — Ambulatory Visit: Admitting: Vascular Surgery

## 2024-05-19 ENCOUNTER — Ambulatory Visit: Payer: PPO | Admitting: Cardiology

## 2024-05-28 ENCOUNTER — Telehealth: Payer: Self-pay | Admitting: Pharmacist

## 2024-05-28 DIAGNOSIS — I2510 Atherosclerotic heart disease of native coronary artery without angina pectoris: Secondary | ICD-10-CM

## 2024-05-28 MED ORDER — ROSUVASTATIN CALCIUM 5 MG PO TABS: 5.0000 mg | ORAL_TABLET | Freq: Every day | ORAL | 1 refills | Status: DC

## 2024-05-28 NOTE — Progress Notes (Signed)
 Pharmacy Quality Measure Review   This patient is appearing on a report for being at risk of failing the adherence measure for cholesterol (statin) medications this calendar year.   Medication: Rosuvastatin  5mg   Last fill date: 02/11/2024 for 90 day supply   Left voicemail for patient to return my call at their convenience Pt has no refills remaining, saw PCP in June. Will send refill.  Lum Ricks, PharmD Candidate  [facility] [employer]   Darrelyn Drum, PharmD, [employer], CPP Clinical Pharmacist Practitioner [facility] [phone]

## 2024-06-08 ENCOUNTER — Other Ambulatory Visit: Payer: Self-pay

## 2024-06-08 NOTE — Progress Notes (Signed)
 Pharmacy Quality Measure Review  This patient is appearing on a report for being at risk of failing the adherence measure for cholesterol (statin) medications this calendar year.   Medication: rosuvastatin  5 mg daily Last fill date: 02/11/24 for 90 day supply  Spoke with patient's daughter and DPR, Tyeasha Ebbs. She states that the patient has stopped the rosuvastatin  and ezetimibe  due to concern with pain and weakness in her legs. Patient is now taking red yeast rice (1200 mg per day) for the past 1-2 weeks and Co-Q 10. Counseled on the comparison between red yeast rice and statin therapy. She is not interested in restarting any medications at this time. She prefers to have the medications taken off of the patient's profile.   Updated patients medication profile, added current herbal supplements, and medications that she has discontinued, which are:  All inhalers (albuterol  and Symbicort ) Rosuvastatin  Ezetimibe    Discussed risks vs benefits of restarting statin therapy. Counseling on similar mechanism of action of red yeast rice and statin, along with concern of different doses in each capsule of the supplement. Currently, the patient's daughter is not interested in restarting any medication therapy.   Woodie Jock, PharmD PGY1 Pharmacy Resident

## 2024-06-10 NOTE — Progress Notes (Shared)
 [facility] - Clinic Note  06/16/2024     CHIEF COMPLAINT Patient presents for No chief complaint on file.   HISTORY OF PRESENT ILLNESS: Brenda Franklin is a 78 y.o. female who presents to the clinic today for:    Patient feels the vision is the same. She states the left eye has a lot of dryness. She is using refresh artificial tears.  Referring physician: Geofm Glade PARAS, MD [address] [place],  [place] [address]  HISTORICAL INFORMATION:   Selected notes from the MEDICAL RECORD NUMBER     CURRENT MEDICATIONS: No current outpatient medications on file. (Ophthalmic Drugs)   No current facility-administered medications for this visit. (Ophthalmic Drugs)   Current Outpatient Medications (Other)  Medication Sig   bismuth subsalicylate (PEPTO BISMOL) 262 MG/15ML suspension Take 30 mLs by mouth every 6 (six) hours as needed for indigestion.   Cholecalciferol (D-3-5) 125 MCG (5000 UT) capsule Take 5,000 Units by mouth daily.   citalopram (CELEXA) 10 MG tablet Take 10 mg by mouth daily.   co-enzyme Q-10 30 MG capsule Take 30 mg by mouth 3 (three) times daily.   lidocaine  (LIDODERM ) 5 % Place 1 patch onto the skin daily as needed (pain). Remove & Discard patch within 12 hours or as directed by MD   Magnesium Glycinate 100 MG CAPS Take 300 mg by mouth daily.   metoprolol  tartrate (LOPRESSOR ) 25 MG tablet TAKE 1 TABLET(25 MG) BY MOUTH TWICE DAILY   Multiple Vitamin (MULTIVITAMIN) capsule Take 1 capsule by mouth daily.   multivitamin-lutein (OCUVITE-LUTEIN) CAPS capsule Take 1 capsule by mouth daily.   Red Yeast Rice 600 MG CAPS Take 2 capsules by mouth daily.   traZODone  (DESYREL ) 50 MG tablet TAKE 1 TO 3 TABLETS BY MOUTH EVERY DAY AT BEDTIME AS NEEDED   No current facility-administered medications for this visit. (Other)   REVIEW OF SYSTEMS:    ALLERGIES Allergies  Allergen Reactions   Tolmetin Nausea And Vomiting   Codeine Nausea And Vomiting and Other  (See Comments)    Upset stomach   Lovastatin     Other reaction(s): myalgia   Milk-Related Compounds Diarrhea   Mirtazapine     Other reaction(s): loose stool, too sedating   Nsaids Diarrhea and Other (See Comments)    Stomach upset   Vilazodone Hcl     Other reaction(s): hallucination w 20mg  dose   Oxycodone  Nausea And Vomiting   PAST MEDICAL HISTORY Past Medical History:  Diagnosis Date   Asthma    on symbicort ; rare albuterol  use   BCC (basal cell carcinoma) 12/09/2012   right inner eye (Cx35FU)   CAD (coronary artery disease)    Cataracts, bilateral    Compression fracture of fifth lumbar vertebra (HCC)    Depression    Dysrhythmia    Extremity pain    High cholesterol    Past Surgical History:  Procedure Laterality Date   ABDOMINAL HYSTERECTOMY     AUGMENTATION MAMMAPLASTY Bilateral    BREAST ENHANCEMENT SURGERY     COLONOSCOPY     ESOPHAGEAL DILATION     EXCISION VAGINAL CYST     HEMORRHOID SURGERY     iterstim implant removal  2016   MASS EXCISION Left 09/12/2021   Procedure: EXCISION MASS LEFT NECK MASS;  Surgeon: Karis Clunes, MD;  Location: [facility] OR;  Service: ENT;  Laterality: Left;   metal stimulator for bowel control     Removed   NASAL SEPTOPLASTY W/  TURBINOPLASTY Bilateral 09/12/2021   Procedure: NASAL SEPTOPLASTY WITH TURBINATE REDUCTION;  Surgeon: Karis Clunes, MD;  Location: [facility] OR;  Service: ENT;  Laterality: Bilateral;   NOSE SURGERY     FAMILY HISTORY Family History  Problem Relation Age of Onset   Other Mother        Intestinal problem - gangrene   Cancer Father        Bile Duct Cancer   Heart disease Father    Heart attack Father    Hypercholesterolemia Father    Bone cancer Sister    Bone cancer Brother    Colon cancer Neg Hx    Breast cancer Neg Hx    Ovarian cancer Neg Hx    Endometrial cancer Neg Hx    Pancreatic cancer Neg Hx    Prostate cancer Neg Hx    SOCIAL HISTORY Social History   Tobacco Use   Smoking status: Every Day     Current packs/day: 0.50    Average packs/day: 0.5 packs/day for 45.7 years (22.8 ttl pk-yrs)    Types: Cigarettes    Start date: 10/08/1978    Passive exposure: Current   Smokeless tobacco: Never  Vaping Use   Vaping status: Never Used  Substance Use Topics   Alcohol use: No   Drug use: No       OPHTHALMIC EXAM:  Not recorded    IMAGING AND PROCEDURES  Imaging and Procedures for 06/16/2024          ASSESSMENT/PLAN:  No diagnosis found.  1. Age related macular degeneration, non-exudative, both eyes   - intermediate stage w/ mild drusen -- stable - The incidence, anatomy, and pathology of dry AMD, risk of progression, and the AREDS and AREDS 2 study including smoking risks discussed with patient.  - Recommend amsler grid monitoring             - F/u in 1 yr -- DFE/OCT  2,3. Hypertensive retinopathy OU  - exam with dilated and tortuous venules OU - discussed importance of tight BP control. - BP in office 7.20.21: 156/72 - monitor  4. Pseudophakia OU  - s/p CE/IOL (Dr. Marcey, 11.2021)  - IOL in good position, doing well  - history of post op CME -- stably resolved today  - monitor  5. Dry eyes OU - recommend artificial tears and lubricating ointment as needed - Most likely cause for most recent flare-up of blurred vision OS   Ophthalmic Meds Ordered this visit:  No orders of the defined types were placed in this encounter.    No follow-ups on file.  There are no Patient Instructions on file for this visit.  This document serves as a record of services personally performed by Redell JUDITHANN Hans, MD, PhD. It was created on their behalf by Alan PARAS. Delores, OA an ophthalmic technician. The creation of this record is the provider's dictation and/or activities during the visit.    Electronically signed by: Alan PARAS. Delores, OA 06/10/24 12:56 PM  This document serves as a record of services personally performed by Redell JUDITHANN Hans, MD, PhD. It was created on their  behalf by Wanda GEANNIE Keens, COT an ophthalmic technician. The creation of this record is the provider's dictation and/or activities during the visit.    Electronically signed by:  Wanda GEANNIE Keens, COT  06/10/24 12:56 PM  Redell JUDITHANN Hans, M.D., Ph.D. Diseases & Surgery of the Retina and Vitreous [facility]  I have reviewed the  above documentation for accuracy and completeness, and I agree with the above. Redell JUDITHANN Hans, M.D., Ph.D. 06/18/23 12:56 PM  Abbreviations: M myopia (nearsighted); A astigmatism; H hyperopia (farsighted); P presbyopia; Mrx spectacle prescription;  CTL contact lenses; OD right eye; OS left eye; OU both eyes  XT exotropia; ET esotropia; PEK punctate epithelial keratitis; PEE punctate epithelial erosions; DES dry eye syndrome; MGD meibomian gland dysfunction; ATs artificial tears; PFAT's preservative free artificial tears; NSC nuclear sclerotic cataract; PSC posterior subcapsular cataract; ERM epi-retinal membrane; PVD posterior vitreous detachment; RD retinal detachment; DM diabetes mellitus; DR diabetic retinopathy; NPDR non-proliferative diabetic retinopathy; PDR proliferative diabetic retinopathy; CSME clinically significant macular edema; DME diabetic macular edema; dbh dot blot hemorrhages; CWS cotton wool spot; POAG primary open angle glaucoma; C/D cup-to-disc ratio; HVF humphrey visual field; GVF goldmann visual field; OCT optical coherence tomography; IOP intraocular pressure; BRVO Branch retinal vein occlusion; CRVO central retinal vein occlusion; CRAO central retinal artery occlusion; BRAO branch retinal artery occlusion; RT retinal tear; SB scleral buckle; PPV pars plana vitrectomy; VH Vitreous hemorrhage; PRP panretinal laser photocoagulation; IVK intravitreal kenalog ; VMT vitreomacular traction; MH Macular hole;  NVD neovascularization of the disc; NVE neovascularization elsewhere; AREDS age related eye disease study; ARMD age related  macular degeneration; POAG primary open angle glaucoma; EBMD epithelial/anterior basement membrane dystrophy; ACIOL anterior chamber intraocular lens; IOL intraocular lens; PCIOL posterior chamber intraocular lens; Phaco/IOL phacoemulsification with intraocular lens placement; PRK photorefractive keratectomy; LASIK laser assisted in situ keratomileusis; HTN hypertension; DM diabetes mellitus; COPD chronic obstructive pulmonary disease

## 2024-06-16 ENCOUNTER — Encounter (INDEPENDENT_AMBULATORY_CARE_PROVIDER_SITE_OTHER): Payer: PPO | Admitting: Ophthalmology

## 2024-06-16 DIAGNOSIS — H04123 Dry eye syndrome of bilateral lacrimal glands: Secondary | ICD-10-CM

## 2024-06-16 DIAGNOSIS — Z961 Presence of intraocular lens: Secondary | ICD-10-CM

## 2024-06-16 DIAGNOSIS — H353132 Nonexudative age-related macular degeneration, bilateral, intermediate dry stage: Secondary | ICD-10-CM

## 2024-06-16 DIAGNOSIS — I10 Essential (primary) hypertension: Secondary | ICD-10-CM

## 2024-06-16 DIAGNOSIS — H35033 Hypertensive retinopathy, bilateral: Secondary | ICD-10-CM

## 2024-07-05 NOTE — Progress Notes (Unsigned)
 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  She was previously being worked up for shortness of breath by her cardiologist Dr. Elmira and had a CT chest on 08/10/2020 where there was concern for a dissection flap in the infrarenal abdominal aorta.  Further work-up with a CTA on 10/03/2020 showed a PAU arising from the left lateral wall of the abdominal aorta with a maximal diameter of 2.4 cm.    Today complaining of a lot of right hip pain that radiates into her thigh.  Denies any abdominal pain.  Past Medical History:  Diagnosis Date   Asthma    on symbicort ; rare albuterol  use   BCC (basal cell carcinoma) 12/09/2012   right inner eye (Cx35FU)   CAD (coronary artery disease)    Cataracts, bilateral    Compression fracture of fifth lumbar vertebra (HCC)    Depression    Dysrhythmia    Extremity pain    High cholesterol     Past Surgical History:  Procedure Laterality Date   ABDOMINAL HYSTERECTOMY     AUGMENTATION MAMMAPLASTY Bilateral    BREAST ENHANCEMENT SURGERY     COLONOSCOPY     ESOPHAGEAL DILATION     EXCISION VAGINAL CYST     HEMORRHOID SURGERY     iterstim implant removal  2016   MASS EXCISION Left 09/12/2021   Procedure: EXCISION MASS LEFT NECK MASS;  Surgeon: Karis Clunes, MD;  Location: [facility] OR;  Service: ENT;  Laterality: Left;   metal stimulator for bowel control     Removed   NASAL SEPTOPLASTY W/ TURBINOPLASTY Bilateral 09/12/2021   Procedure: NASAL SEPTOPLASTY WITH TURBINATE REDUCTION;  Surgeon: Karis Clunes, MD;  Location: [facility] OR;  Service: ENT;  Laterality: Bilateral;   NOSE SURGERY      Family History  Problem Relation Age of Onset   Other Mother        Intestinal problem - gangrene   Cancer  Father        Bile Duct Cancer   Heart disease Father    Heart attack Father    Hypercholesterolemia Father    Bone cancer Sister    Bone cancer Brother    Colon cancer Neg Hx    Breast cancer Neg Hx    Ovarian cancer Neg Hx    Endometrial cancer Neg Hx    Pancreatic cancer Neg Hx    Prostate cancer Neg Hx     SOCIAL HISTORY: Social History   Socioeconomic History   Marital status: Divorced    Spouse name: Not on file   Number of children: 4   Years of education: 1 year of college   Highest education level: Not on file  Occupational History   Occupation: Retired  Tobacco Use   Smoking status: Every Day    Current packs/day: 0.50    Average packs/day: 0.5 packs/day for 45.7 years (22.9 ttl pk-yrs)    Types: Cigarettes    Start date: 10/08/1978    Passive exposure: Current   Smokeless tobacco: Never  Vaping Use   Vaping status: Never Used  Substance and Sexual Activity   Alcohol use: No   Drug use: No   Sexual activity: Not Currently  Other Topics Concern   Not  on file  Social History Narrative   Lives at home with her daughter.   Right-handed.   2-3 cups caffeine daily.   Cut back some on the smoking   Social Drivers of Corporate investment banker Strain: Low Risk  (02/27/2024)   Overall Financial Resource Strain (CARDIA)    Difficulty of Paying Living Expenses: Not hard at all  Food Insecurity: No Food Insecurity (02/27/2024)   Hunger Vital Sign    Worried About Running Out of Food in the Last Year: Never true    Ran Out of Food in the Last Year: Never true  Transportation Needs: No Transportation Needs (02/27/2024)   PRAPARE - Administrator, Civil Service (Medical): No    Lack of Transportation (Non-Medical): No  Physical Activity: Insufficiently Active (02/27/2024)   Exercise Vital Sign    Days of Exercise per Week: 1 day    Minutes of Exercise per Session: 30 min  Stress: No Stress Concern Present (02/27/2024)   [employer] of  Occupational Health - Occupational Stress Questionnaire    Feeling of Stress : Not at all  Social Connections: Socially Isolated (02/27/2024)   Social Connection and Isolation Panel    Frequency of Communication with Friends and Family: More than three times a week    Frequency of Social Gatherings with Friends and Family: Once a week    Attends Religious Services: Never    Database administrator or Organizations: No    Attends Banker Meetings: Never    Marital Status: Divorced  Catering manager Violence: Not At Risk (02/27/2024)   Humiliation, Afraid, Rape, and Kick questionnaire    Fear of Current or Ex-Partner: No    Emotionally Abused: No    Physically Abused: No    Sexually Abused: No    Allergies  Allergen Reactions   Tolmetin Nausea And Vomiting   Codeine Nausea And Vomiting and Other (See Comments)    Upset stomach   Lovastatin     Other reaction(s): myalgia   Milk-Related Compounds Diarrhea   Mirtazapine     Other reaction(s): loose stool, too sedating   Nsaids Diarrhea and Other (See Comments)    Stomach upset   Vilazodone Hcl     Other reaction(s): hallucination w 20mg  dose   Oxycodone  Nausea And Vomiting    Current Outpatient Medications  Medication Sig Dispense Refill   bismuth subsalicylate (PEPTO BISMOL) 262 MG/15ML suspension Take 30 mLs by mouth every 6 (six) hours as needed for indigestion.     Cholecalciferol (D-3-5) 125 MCG (5000 UT) capsule Take 5,000 Units by mouth daily.     citalopram (CELEXA) 10 MG tablet Take 10 mg by mouth daily.     co-enzyme Q-10 30 MG capsule Take 30 mg by mouth 3 (three) times daily.     lidocaine  (LIDODERM ) 5 % Place 1 patch onto the skin daily as needed (pain). Remove & Discard patch within 12 hours or as directed by MD 30 patch 5   Magnesium Glycinate 100 MG CAPS Take 300 mg by mouth daily.     metoprolol  tartrate (LOPRESSOR ) 25 MG tablet TAKE 1 TABLET(25 MG) BY MOUTH TWICE DAILY 180 tablet 2   Multiple Vitamin  (MULTIVITAMIN) capsule Take 1 capsule by mouth daily.     multivitamin-lutein (OCUVITE-LUTEIN) CAPS capsule Take 1 capsule by mouth daily.     Red Yeast Rice 600 MG CAPS Take 2 capsules by mouth daily.     traZODone  (DESYREL ) 50  MG tablet TAKE 1 TO 3 TABLETS BY MOUTH EVERY DAY AT BEDTIME AS NEEDED 270 tablet 1   No current facility-administered medications for this visit.    REVIEW OF SYSTEMS:  [X]  denotes positive finding, [ ]  denotes negative finding Cardiac  Comments:  Chest pain or chest pressure:    Shortness of breath upon exertion:    Short of breath when lying flat:    Irregular heart rhythm:        Vascular    Pain in calf, thigh, or hip brought on by ambulation:    Pain in feet at night that wakes you up from your sleep:     Blood clot in your veins:    Leg swelling:         Pulmonary    Oxygen at home:    Productive cough:     Wheezing:         Neurologic    Sudden weakness in arms or legs:     Sudden numbness in arms or legs:     Sudden onset of difficulty speaking or slurred speech:    Temporary loss of vision in one eye:     Problems with dizziness:         Gastrointestinal    Blood in stool:     Vomited blood:         Genitourinary    Burning when urinating:     Blood in urine:        Psychiatric    Major depression:         Hematologic    Bleeding problems:    Problems with blood clotting too easily:        Skin    Rashes or ulcers:        Constitutional    Fever or chills:      PHYSICAL EXAM: There were no vitals filed for this visit.   GENERAL: The patient is a well-nourished female, in no acute distress. The vital signs are documented above. CARDIAC: There is a regular rate and rhythm.  VASCULAR:  Palpable femoral pulses bilaterally Right PT palpable PULMONARY: No respiratory distress. ABDOMEN: Soft and non-tender.  No pain with deep palpation. MUSCULOSKELETAL: There are no major deformities or cyanosis. NEUROLOGIC: No focal  weakness or paresthesias are detected. SKIN: There are no ulcers or rashes noted. PSYCHIATRIC: The patient has a normal affect.  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  My plan today was to evaluate this with AAA duplex and avoid a CT scan.  No large aneurysm was identified with maximal aortic diameter of 1.8 cm.  I reviewed these findings with the family.  Discussed that her aneurysm has not resolved and likely limited with visualization on US  today.  I will plan follow-up in 6 months with CTA to get better information.  Previous no significant growth on imaging and we have elected for ongoing observation.   Lonni DOROTHA Gaskins, MD [facility] Office: [phone]

## 2024-07-06 ENCOUNTER — Encounter: Payer: Self-pay | Admitting: Vascular Surgery

## 2024-07-06 ENCOUNTER — Ambulatory Visit: Admitting: Vascular Surgery

## 2024-07-06 ENCOUNTER — Ambulatory Visit (HOSPITAL_COMMUNITY)
Admission: RE | Admit: 2024-07-06 | Discharge: 2024-07-06 | Disposition: A | Discharge status: Home / Self Care | Source: Ambulatory Visit | Attending: Vascular Surgery | Admitting: Vascular Surgery

## 2024-07-06 VITALS — BP 142/72 | HR 61 | Temp 98.6°F | Resp 18 | Ht 64.5 in | Wt 129.7 lb

## 2024-07-06 DIAGNOSIS — I719 Aortic aneurysm of unspecified site, without rupture: Secondary | ICD-10-CM | POA: Insufficient documentation

## 2024-07-20 NOTE — Progress Notes (Signed)
 [facility] - Clinic Note  07/28/2024     CHIEF COMPLAINT Patient presents for Retina Follow Up   HISTORY OF PRESENT ILLNESS: Brenda Franklin is a 78 y.o. female who presents to the clinic today for:   HPI     Retina Follow Up   Patient presents with  Dry AMD.  In both eyes.  This started 1 year ago.  I, the attending physician,  performed the HPI with the patient and updated documentation appropriately.        Comments   Patient here for 1 year retina follow up for non exu ARMD OU. Patient states vision doing fine. No eye pain. OS is dry. using Refresh prn      Last edited by Valdemar Rogue, MD on 08/01/2024  3:34 PM.     Patient states she's having some issues w/ OS vision. Very dry, uses lots of drops. Dementia is worsening.   Referring physician: Geofm Glade PARAS, MD [address] [place],  [place] [address]  HISTORICAL INFORMATION:   Selected notes from the MEDICAL RECORD NUMBER     CURRENT MEDICATIONS: No current outpatient medications on file. (Ophthalmic Drugs)   No current facility-administered medications for this visit. (Ophthalmic Drugs)   Current Outpatient Medications (Other)  Medication Sig   bismuth subsalicylate (PEPTO BISMOL) 262 MG/15ML suspension Take 30 mLs by mouth every 6 (six) hours as needed for indigestion.   Cholecalciferol (D-3-5) 125 MCG (5000 UT) capsule Take 5,000 Units by mouth daily.   citalopram (CELEXA) 10 MG tablet Take 10 mg by mouth daily.   co-enzyme Q-10 30 MG capsule Take 30 mg by mouth 3 (three) times daily.   lidocaine  (LIDODERM ) 5 % Place 1 patch onto the skin daily as needed (pain). Remove & Discard patch within 12 hours or as directed by MD   Magnesium Glycinate 100 MG CAPS Take 300 mg by mouth daily.   metoprolol  tartrate (LOPRESSOR ) 25 MG tablet TAKE 1 TABLET(25 MG) BY MOUTH TWICE DAILY   Multiple Vitamin (MULTIVITAMIN) capsule Take 1 capsule by mouth daily.   multivitamin-lutein (OCUVITE-LUTEIN)  CAPS capsule Take 1 capsule by mouth daily.   traZODone  (DESYREL ) 50 MG tablet TAKE 1 TO 3 TABLETS BY MOUTH EVERY DAY AT BEDTIME AS NEEDED   Red Yeast Rice 600 MG CAPS Take 2 capsules by mouth daily. (Patient not taking: Reported on 07/28/2024)   No current facility-administered medications for this visit. (Other)   REVIEW OF SYSTEMS: ROS   Positive for: Neurological, Skin, Eyes Negative for: Constitutional, Gastrointestinal, Genitourinary, Musculoskeletal, HENT, Endocrine, Cardiovascular, Respiratory, Psychiatric, Allergic/Imm, Heme/Lymph Last edited by Orval Asberry RAMAN, COA on 07/28/2024  2:44 PM.       ALLERGIES Allergies  Allergen Reactions   Tolmetin Nausea And Vomiting   Codeine Nausea And Vomiting and Other (See Comments)    Upset stomach   Lovastatin     Other reaction(s): myalgia   Milk-Related Compounds Diarrhea   Mirtazapine     Other reaction(s): loose stool, too sedating   Nsaids Diarrhea and Other (See Comments)    Stomach upset   Vilazodone Hcl     Other reaction(s): hallucination w 20mg  dose   Oxycodone  Nausea And Vomiting   PAST MEDICAL HISTORY Past Medical History:  Diagnosis Date   AAA (abdominal aortic aneurysm)    Asthma    on symbicort ; rare albuterol  use   BCC (basal cell carcinoma) 12/09/2012   right inner eye (Cx35FU)   CAD (coronary  artery disease)    Cataracts, bilateral    Compression fracture of fifth lumbar vertebra (HCC)    Depression    Dysrhythmia    Extremity pain    High cholesterol    Past Surgical History:  Procedure Laterality Date   ABDOMINAL HYSTERECTOMY     AUGMENTATION MAMMAPLASTY Bilateral    BREAST ENHANCEMENT SURGERY     COLONOSCOPY     ESOPHAGEAL DILATION     EXCISION VAGINAL CYST     HEMORRHOID SURGERY     iterstim implant removal  2016   MASS EXCISION Left 09/12/2021   Procedure: EXCISION MASS LEFT NECK MASS;  Surgeon: Karis Clunes, MD;  Location: [facility] OR;  Service: ENT;  Laterality: Left;   metal stimulator for  bowel control     Removed   NASAL SEPTOPLASTY W/ TURBINOPLASTY Bilateral 09/12/2021   Procedure: NASAL SEPTOPLASTY WITH TURBINATE REDUCTION;  Surgeon: Karis Clunes, MD;  Location: [facility] OR;  Service: ENT;  Laterality: Bilateral;   NOSE SURGERY     FAMILY HISTORY Family History  Problem Relation Age of Onset   Other Mother        Intestinal problem - gangrene   Cancer Father        Bile Duct Cancer   Heart disease Father    Heart attack Father    Hypercholesterolemia Father    Bone cancer Sister    Bone cancer Brother    Colon cancer Neg Hx    Breast cancer Neg Hx    Ovarian cancer Neg Hx    Endometrial cancer Neg Hx    Pancreatic cancer Neg Hx    Prostate cancer Neg Hx    SOCIAL HISTORY Social History   Tobacco Use   Smoking status: Some Days    Current packs/day: 0.50    Average packs/day: 0.5 packs/day for 45.8 years (22.9 ttl pk-yrs)    Types: Cigarettes    Start date: 10/08/1978    Passive exposure: Current   Smokeless tobacco: Never  Vaping Use   Vaping status: Never Used  Substance Use Topics   Alcohol use: No   Drug use: No       OPHTHALMIC EXAM:  Base Eye Exam     Visual Acuity (Snellen - Linear)       Right Left   Dist [place] 20/25 +1 20/25 -2   Dist ph [place]  20/25 +1         Tonometry (Tonopen, 2:42 PM)       Right Left   Pressure 11 09         Pupils       Dark Light Shape React APD   Right 2 1 Round Brisk None   Left 2 1 Round Brisk None         Visual Fields (Counting fingers)       Left Right    Full Full         Extraocular Movement       Right Left    Full, Ortho Full, Ortho         Neuro/Psych     Oriented x3: Yes   Mood/Affect: Normal         Dilation     Both eyes: 1.0% Mydriacyl, 2.5% Phenylephrine  @ 2:42 PM           Slit Lamp and Fundus Exam     Slit Lamp Exam       Right Left   Lids/Lashes Dermato Dermato  Conjunctiva/Sclera White and quiet White and quiet   Cornea Arcus, tear film debris, well  healed cataract wound Arcus, trace tear film debris, well healed cataract wounds, 1+ fine PEE   Anterior Chamber Deep and clear Deep and clear, No cell or flare   Iris Round and poorly dilated, small TID defect at 0300 Round and moderately dilated   Lens PC IOL in good position PC IOL in good position   Anterior Vitreous Synerisis Synerisis         Fundus Exam       Right Left   Disc Pink, sharp, compact Pink, sharp, compact, Mild temporal PPA   C/D Ratio 0.1 0.1   Macula flat, Blunted foveal reflex, RPE mottling and clumping, Drusen, No heme or edema Blunted foveal reflex, Drusen, RPE mottling and clumping, No heme or edema.   Vessels Attenuated, tortuous, Copper wiring, AV crossing changes Attenuated, tortuous, Copper wiring, AV crossing changes   Periphery Attached, Reticular degeneration, No heme Attached, Reticular degeneration, No heme           IMAGING AND PROCEDURES  Imaging and Procedures for 07/28/2024  OCT, Retina - OU - Both Eyes       Right Eye Quality was good. Central Foveal Thickness: 255. Progression has been stable. Findings include normal foveal contour, no IRF, no SRF, retinal drusen , pigment epithelial detachment.   Left Eye Quality was good. Central Foveal Thickness: 258. Progression has been stable. Findings include normal foveal contour, no IRF, no SRF, retinal drusen , pigment epithelial detachment.   Notes *Images captured and stored on drive  Diagnosis / Impression:  Non-exu ARMD OU - stable  Clinical management:  See below  Abbreviations: NFP - Normal foveal profile. CME - cystoid macular edema. PED - pigment epithelial detachment. IRF - intraretinal fluid. SRF - subretinal fluid. EZ - ellipsoid zone. ERM - epiretinal membrane. ORA - outer retinal atrophy. ORT - outer retinal tubulation. SRHM - subretinal hyper-reflective material. IRHM - intraretinal hyper-reflective material             ASSESSMENT/PLAN:    ICD-10-CM   1.  Intermediate stage nonexudative age-related macular degeneration of both eyes  H35.3132 OCT, Retina - OU - Both Eyes    2. Essential hypertension  I10     3. Hypertensive retinopathy of both eyes  H35.033     4. Pseudophakia, both eyes  Z96.1     5. Dry eyes  H04.123      1. Age related macular degeneration, non-exudative, both eyes   - intermediate stage w/ mild drusen -- stable - The incidence, anatomy, and pathology of dry AMD, risk of progression, and the AREDS and AREDS 2 study including smoking risks discussed with patient.  - Recommend amsler grid monitoring             - F/u in 1 yr -- DFE/OCT  2,3. Hypertensive retinopathy OU  - exam with dilated and tortuous venules OU - discussed importance of tight BP control. - BP in office 7.20.21: 156/72 - monitor  4. Pseudophakia OU  - s/p CE/IOL (Dr. Marcey, 11.2021)  - IOL in good position, doing well  - history of post op CME -- stably resolved today  - monitor  5. Dry eyes OU - recommend artificial tears and lubricating ointment as needed   Ophthalmic Meds Ordered this visit:  No orders of the defined types were placed in this encounter.    Return in about 1 year (around 07/28/2025) for  non exu ARMD OU, DFE, OCT.  There are no Patient Instructions on file for this visit.  This document serves as a record of services personally performed by Redell JUDITHANN Hans, MD, PhD. It was created on their behalf by Almetta Pesa, an ophthalmic technician. The creation of this record is the provider's dictation and/or activities during the visit.    Electronically signed by: Almetta Pesa, OA, 08/01/24  3:35 PM   Redell JUDITHANN Hans, M.D., Ph.D. Diseases & Surgery of the Retina and Vitreous [facility]  I have reviewed the above documentation for accuracy and completeness, and I agree with the above. Redell JUDITHANN Hans, M.D., Ph.D. 08/01/24 3:35 PM   Abbreviations: M myopia (nearsighted); A astigmatism; H  hyperopia (farsighted); P presbyopia; Mrx spectacle prescription;  CTL contact lenses; OD right eye; OS left eye; OU both eyes  XT exotropia; ET esotropia; PEK punctate epithelial keratitis; PEE punctate epithelial erosions; DES dry eye syndrome; MGD meibomian gland dysfunction; ATs artificial tears; PFAT's preservative free artificial tears; NSC nuclear sclerotic cataract; PSC posterior subcapsular cataract; ERM epi-retinal membrane; PVD posterior vitreous detachment; RD retinal detachment; DM diabetes mellitus; DR diabetic retinopathy; NPDR non-proliferative diabetic retinopathy; PDR proliferative diabetic retinopathy; CSME clinically significant macular edema; DME diabetic macular edema; dbh dot blot hemorrhages; CWS cotton wool spot; POAG primary open angle glaucoma; C/D cup-to-disc ratio; HVF humphrey visual field; GVF goldmann visual field; OCT optical coherence tomography; IOP intraocular pressure; BRVO Branch retinal vein occlusion; CRVO central retinal vein occlusion; CRAO central retinal artery occlusion; BRAO branch retinal artery occlusion; RT retinal tear; SB scleral buckle; PPV pars plana vitrectomy; VH Vitreous hemorrhage; PRP panretinal laser photocoagulation; IVK intravitreal kenalog ; VMT vitreomacular traction; MH Macular hole;  NVD neovascularization of the disc; NVE neovascularization elsewhere; AREDS age related eye disease study; ARMD age related macular degeneration; POAG primary open angle glaucoma; EBMD epithelial/anterior basement membrane dystrophy; ACIOL anterior chamber intraocular lens; IOL intraocular lens; PCIOL posterior chamber intraocular lens; Phaco/IOL phacoemulsification with intraocular lens placement; PRK photorefractive keratectomy; LASIK laser assisted in situ keratomileusis; HTN hypertension; DM diabetes mellitus; COPD chronic obstructive pulmonary disease

## 2024-07-28 ENCOUNTER — Ambulatory Visit (INDEPENDENT_AMBULATORY_CARE_PROVIDER_SITE_OTHER): Admitting: Ophthalmology

## 2024-07-28 ENCOUNTER — Encounter (INDEPENDENT_AMBULATORY_CARE_PROVIDER_SITE_OTHER): Payer: Self-pay | Admitting: Ophthalmology

## 2024-07-28 DIAGNOSIS — H04123 Dry eye syndrome of bilateral lacrimal glands: Secondary | ICD-10-CM

## 2024-07-28 DIAGNOSIS — Z961 Presence of intraocular lens: Secondary | ICD-10-CM

## 2024-07-28 DIAGNOSIS — I10 Essential (primary) hypertension: Secondary | ICD-10-CM | POA: Diagnosis not present

## 2024-07-28 DIAGNOSIS — H35033 Hypertensive retinopathy, bilateral: Secondary | ICD-10-CM

## 2024-07-28 DIAGNOSIS — H353132 Nonexudative age-related macular degeneration, bilateral, intermediate dry stage: Secondary | ICD-10-CM

## 2024-08-01 ENCOUNTER — Encounter (INDEPENDENT_AMBULATORY_CARE_PROVIDER_SITE_OTHER): Payer: Self-pay | Admitting: Ophthalmology

## 2024-08-02 ENCOUNTER — Encounter: Payer: Self-pay | Admitting: Podiatry

## 2024-08-02 ENCOUNTER — Ambulatory Visit: Admitting: Podiatry

## 2024-08-02 DIAGNOSIS — B351 Tinea unguium: Secondary | ICD-10-CM | POA: Diagnosis not present

## 2024-08-02 DIAGNOSIS — L600 Ingrowing nail: Secondary | ICD-10-CM | POA: Diagnosis not present

## 2024-08-02 NOTE — Patient Instructions (Signed)

## 2024-08-04 NOTE — Progress Notes (Signed)
 Subjective:   Patient ID: Brenda Franklin, female   DOB: 78 y.o.   MRN: [id number]   HPI Patient presents with caregiver with chronic ingrown toenail deformity of the big toes both feet that have been bothersome along with structural damage to the nailbeds.  She does have degree of dementia and presents with her daughter today who is helpful and does not smoke likes to be active   Review of Systems  All other systems reviewed and are negative.       Objective:  Physical Exam Vitals and nursing note reviewed.  Constitutional:      Appearance: She is well-developed.  Pulmonary:     Effort: Pulmonary effort is normal.  Musculoskeletal:        General: Normal range of motion.  Skin:    General: Skin is warm.  Neurological:     Mental Status: She is alert.     Neurovascular status found to be intact muscle strength was found to be adequate range of motion adequate with the patient noted to have severe discomfort of the medial border of the hallux bilateral and also structural damage with thickness of the nailbed hallux and several other nails bilateral.  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  They read and signed consent form understanding risk and organ to try to just work on the borders versus the entire nail and I infiltrated each hallux 60 mg Xylocaine  Marcaine mixture sterile prep done and using sterile instrumentation removed the medial borders bilateral exposed matrix applied phenol 3 applications 30 seconds followed by alcohol lavage sterile dressing gave instructions on soaks wear dressings 24 hours take them off earlier if throbbing were to occur with all questions answered today

## 2024-08-18 ENCOUNTER — Ambulatory Visit: Attending: Cardiovascular Disease | Admitting: Cardiovascular Disease

## 2024-08-18 ENCOUNTER — Encounter: Payer: Self-pay | Admitting: Cardiovascular Disease

## 2024-08-18 VITALS — BP 114/48 | HR 71 | Ht 64.5 in | Wt 131.8 lb

## 2024-08-18 DIAGNOSIS — I739 Peripheral vascular disease, unspecified: Secondary | ICD-10-CM | POA: Diagnosis not present

## 2024-08-18 DIAGNOSIS — I719 Aortic aneurysm of unspecified site, without rupture: Secondary | ICD-10-CM | POA: Diagnosis not present

## 2024-08-18 DIAGNOSIS — I10 Essential (primary) hypertension: Secondary | ICD-10-CM | POA: Diagnosis not present

## 2024-08-18 DIAGNOSIS — Z87891 Personal history of nicotine dependence: Secondary | ICD-10-CM

## 2024-08-18 DIAGNOSIS — E782 Mixed hyperlipidemia: Secondary | ICD-10-CM | POA: Diagnosis not present

## 2024-08-18 DIAGNOSIS — I2510 Atherosclerotic heart disease of native coronary artery without angina pectoris: Secondary | ICD-10-CM | POA: Diagnosis not present

## 2024-08-18 NOTE — Patient Instructions (Signed)
 Medication Instructions:  No changes- Dr Francyne recommends starting Repatha  *If you need a refill on your cardiac medications before your next appointment, please call your pharmacy*  Lab Work: Fasting lipid panel in 3 months If you have labs (blood work) drawn today and your tests are completely normal, you will receive your results only by: MyChart Message (if you have MyChart) OR A paper copy in the mail If you have any lab test that is abnormal or we need to change your treatment, we will call you to review the results.  Testing/Procedures: None ordered  Follow-Up: At [facility], you and your health needs are our priority.  As part of our continuing mission to provide you with exceptional heart care, our providers are all part of one team.  This team includes your primary Cardiologist (physician) and Advanced Practice Providers or APPs (Physician Assistants and Nurse Practitioners) who all work together to provide you with the care you need, when you need it.  Your next appointment:   1 year(s)  Provider:   Dr Francyne  We recommend signing up for the patient portal called MyChart.  Sign up information is provided on this After Visit Summary.  MyChart is used to connect with patients for Virtual Visits (Telemedicine).  Patients are able to view lab/test results, encounter notes, upcoming appointments, etc.  Non-urgent messages can be sent to your provider as well.   To learn more about what you can do with MyChart, go to [email or web address].

## 2024-08-21 NOTE — Progress Notes (Signed)
 Cardiology Office Note   Date:  08/21/2024  ID:  Brenda Franklin, DOB 1946/01/13, MRN [id number] PCP: Geofm Glade PARAS, MD  [facility] Providers Cardiologist:  Jerel Balding, MD     History of Present Illness Brenda Franklin is a 78 y.o. 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 Elmira.  She has seen Dr. Gretta in the vascular surgery clinic.  She has mild dementia and is accompanied by her daughter.  She now lives in independent living in the retirement community.  She has completely quit smoking while she lives there, since it has prohibited.  She will occasionally smoke a cigarette when she is visiting her family.  The patient specifically denies any chest pain at rest or with exertion, dyspnea at rest or with exertion, orthopnea, paroxysmal nocturnal dyspnea, syncope, palpitations, focal neurological deficits, intermittent claudication, lower extremity edema, unexplained weight gain, cough, hemoptysis or wheezing.  Her daughter expresses some concern regarding the cognitive dysfunction and any possibility that this is worsened by statins.  She is currently not taking any statin therapy and has even stopped taking red yeast rice.  Her most recent labs showed severely elevated total cholesterol 295 and markedly elevated LDL at 178 as well as mild hypertriglyceridemia.  She has prediabetes with a hemoglobin A1c of 5.8%.  She has a history of myalgia with lovastatin.  When she last saw cardiology she was taking rosuvastatin  20 mg and Zetia  10 mg, but has stopped both these medications.  By CT in 2024 she had a 2.7 cm penetrating aortic ulcer and a mildly dilated distal infrarenal AAA.  No abnormalities were seen on a more recent duplex ultrasound performed in October 2025    Studies Reviewed EKG Interpretation Date/Time:  Wednesday August 18 2024 16:33:38  EST Ventricular Rate:  71 PR Interval:  176 QRS Duration:  62 QT Interval:  426 QTC Calculation: 462 R Axis:   25  Text Interpretation: Sinus rhythm with occasional Premature ventricular complexes When compared with ECG of 01-May-2018 15:04, Premature ventricular complexes are now Present Nonspecific T wave abnormality no longer evident in Inferior leads Nonspecific T wave abnormality, improved in Lateral leads Confirmed by Perian Tedder [phone]) on 08/18/2024 5:00:15 PM    Normal LVEF 55% with grade 1 diastolic dysfunction, moderate MR without other significant valvular abnormalities by echocardiogram 06/20/2020  Low risk nuclear pharmacological stress test 06/21/2020 with a small reversible defect in the basal inferoseptal myocardium, EF 69%  CT angiogram of the abdominal aorta 10/18/2022 showed distal infrarenal abdominal aortic aneurysm with maximum diameter of 2.7 cm and a penetrating ulceration measuring 2.4 x 1.3 x 1.3 cm  AAA duplex US  07/06/2024 shows normal caliber aorta with maximum diameter of 1.8 cm and without visible dissection or thrombus.  Risk Assessment/Calculations        Physical Exam VS:  BP (!) 114/48 (BP Location: Left Arm, Patient Position: Sitting, Cuff Size: Normal)   Pulse 71   Ht 5' 4.5 (1.638 m)   Wt 131 lb 12.8 oz (59.8 kg)   SpO2 97%   BMI 22.27 kg/m        Wt Readings from Last 3 Encounters:  08/18/24 131 lb 12.8 oz (59.8 kg)  07/06/24 129 lb 11.2 oz (58.8 kg)  03/03/24 130 lb (59 kg)    GEN: Well nourished, well developed in no acute distress NECK: No JVD; No carotid bruits CARDIAC: RRR, no  murmurs, rubs, gallops RESPIRATORY:  Clear to auscultation without rales, wheezing or rhonchi  ABDOMEN: Soft, non-tender, non-distended EXTREMITIES:  No edema; No deformity   ASSESSMENT AND PLAN CAD: Extensive coronary artery calcification by calcium  score but only a tiny area of basal inferoseptal ischemia by nuclear stress testing in the past.  She  does not have angina pectoris.  Revascularization is not indicated.  The focus remains on risk factor mitigation.  In particular I am concerned about her severely elevated cholesterol level. AAA/penetrating ulcer: Most recent evaluation with ultrasound in October 2025 did not identify any abnormalities.  This is likewise asymptomatic and surgical intervention is not recommended. HTN: Very well-controlled on a low-dose of beta-blocker. HLP: Severely elevated total cholesterol and LDL cholesterol in a patient with well-established atherosclerotic disease.  She has a history of statin related myopathy and her daughter expresses concern about the cognitive effects of statins and has even stopped the red yeast rice supplement.  I strongly recommend treatment with a PCSK9 inhibitor such as Repatha  140 mg every 2 weeks.  I pointed out that in clinical trials cognitive abnormalities were prospectively evaluated and these medications have been found to have no impact on neurological function.  Her daughter told me that she will do some research and will get back to us .  If they do decide to go ahead with the Repatha  injections I would repeat a lipid profile in about 3 months. preDM: Very mildly elevated hemoglobin A1c 5.8%.  Avoid sweets and starchy foods with high glycemic index.  She is actually fairly lean, but is also very sedentary. Smoking: She has quit daily cigarettes.  She smokes infrequently.       Dispo:  Patient Instructions  Medication Instructions:  No changes- Dr Francyne recommends starting Repatha  *If you need a refill on your cardiac medications before your next appointment, please call your pharmacy*  Lab Work: Fasting lipid panel in 3 months If you have labs (blood work) drawn today and your tests are completely normal, you will receive your results only by: MyChart Message (if you have MyChart) OR A paper copy in the mail If you have any lab test that is abnormal or we need to change  your treatment, we will call you to review the results.  Testing/Procedures: None ordered  Follow-Up: At [facility], you and your health needs are our priority.  As part of our continuing mission to provide you with exceptional heart care, our providers are all part of one team.  This team includes your primary Cardiologist (physician) and Advanced Practice Providers or APPs (Physician Assistants and Nurse Practitioners) who all work together to provide you with the care you need, when you need it.  Your next appointment:   1 year(s)  Provider:   Dr Francyne  We recommend signing up for the patient portal called MyChart.  Sign up information is provided on this After Visit Summary.  MyChart is used to connect with patients for Virtual Visits (Telemedicine).  Patients are able to view lab/test results, encounter notes, upcoming appointments, etc.  Non-urgent messages can be sent to your provider as well.   To learn more about what you can do with MyChart, go to [email or web address].        Signed, Jerel Francyne, MD

## 2024-08-21 NOTE — Addendum Note (Signed)
 Addended by: FRANCYNE HEADLAND on: 08/21/2024 04:39 PM   Modules accepted: Orders

## 2024-08-24 ENCOUNTER — Telehealth: Payer: Self-pay | Admitting: Cardiovascular Disease

## 2024-08-24 NOTE — Telephone Encounter (Signed)
 Pt c/o medication issue:  1. Name of Medication: metoprolol  tartrate (LOPRESSOR ) 25 MG tablet   2. How are you currently taking this medication (dosage and times per day)?    3. Are you having a reaction (difficulty breathing--STAT)? no  4. What is your medication issue? Daughter calling about the changes that was suppose to be made to patient medication. Please advise

## 2024-08-25 MED ORDER — METOPROLOL SUCCINATE ER 50 MG PO TB24: 50.0000 mg | ORAL_TABLET | Freq: Every day | ORAL | 3 refills | Status: AC

## 2024-08-25 NOTE — Telephone Encounter (Signed)
 S/w Brenda Franklin- she reports that at the last office visit Dr C had discussed changing from Metoprolol  Tartrate 25 mg BID to Metoprolol  Succinate 50 mg once daily. This would help the patient- it is hard for her to remember to take the medication twice daily.  She was following up because she did not see it on the paperwork or heard anything from pharmacy. Informed her that I would send this to Dr C. She asked that it be sent to [facility] on [address].    Sent a secure chat asking Dr Francyne about the medication change. He said to go ahead with the medication change.    Metoprolol  succinate 50 mg RX sent to preferred pharmacy.

## 2024-08-25 NOTE — Telephone Encounter (Signed)
 Left message to call back.

## 2024-08-25 NOTE — Telephone Encounter (Signed)
 Pt daughter returning call. Daughter mentions she is at work and may not be able to answer. She states detailed message may be left on VM. Please advise.

## 2024-09-02 ENCOUNTER — Encounter: Payer: Self-pay | Admitting: Internal Medicine

## 2024-09-02 DIAGNOSIS — N1831 Chronic kidney disease, stage 3a: Secondary | ICD-10-CM | POA: Insufficient documentation

## 2024-09-02 NOTE — Patient Instructions (Addendum)
      Blood work was ordered.       Medications changes include :   None    A Ct scan of the lungs was ordered and someone will call you to schedule an appointment.     Return in about 6 months (around 03/04/2025) for follow up.

## 2024-09-02 NOTE — Progress Notes (Unsigned)
      Subjective:    Patient ID: Brenda Franklin, female    DOB: October 22, 1945, 78 y.o.   MRN: [id number]     HPI Brenda Franklin is here for follow up of her chronic medical problems.  Moved to [facility]    Medications and allergies reviewed with patient and updated if appropriate.  Current Outpatient Medications on File Prior to Visit  Medication Sig Dispense Refill   bismuth subsalicylate (PEPTO BISMOL) 262 MG/15ML suspension Take 30 mLs by mouth every 6 (six) hours as needed for indigestion. (Patient not taking: Reported on 08/18/2024)     citalopram  (CELEXA ) 10 MG tablet Take 10 mg by mouth daily.     lidocaine  (LIDODERM ) 5 % Place 1 patch onto the skin daily as needed (pain). Remove & Discard patch within 12 hours or as directed by MD (Patient not taking: Reported on 08/18/2024) 30 patch 5   Magnesium Glycinate 100 MG CAPS Take 300 mg by mouth daily.     metoprolol  succinate (TOPROL -XL) 50 MG 24 hr tablet Take 1 tablet (50 mg total) by mouth daily. Take with or immediately following a meal. 90 tablet 3   Multiple Vitamin (MULTIVITAMIN) capsule Take 1 capsule by mouth daily.     multivitamin-lutein (OCUVITE-LUTEIN) CAPS capsule Take 1 capsule by mouth daily.     Probiotic Product (PROBIOTIC PO) Take 1 capsule by mouth daily at 6 (six) AM.     traZODone  (DESYREL ) 50 MG tablet TAKE 1 TO 3 TABLETS BY MOUTH EVERY DAY AT BEDTIME AS NEEDED 270 tablet 1   No current facility-administered medications on file prior to visit.     Review of Systems     Objective:  There were no vitals filed for this visit. BP Readings from Last 3 Encounters:  08/18/24 (!) 114/48  07/06/24 (!) 142/72  03/03/24 114/68   Wt Readings from Last 3 Encounters:  08/18/24 131 lb 12.8 oz (59.8 kg)  07/06/24 129 lb 11.2 oz (58.8 kg)  03/03/24 130 lb (59 kg)   There is no height or weight on file to calculate BMI.    Physical Exam     Lab Results  Component Value Date   WBC 6.5  03/03/2024   HGB 13.3 03/03/2024   HCT 39.5 03/03/2024   PLT 194.0 03/03/2024   GLUCOSE 96 03/03/2024   CHOL 295 (H) 03/03/2024   TRIG 247.0 (H) 03/03/2024   HDL 67.00 03/03/2024   LDLCALC 178 (H) 03/03/2024   ALT 17 03/03/2024   AST 18 03/03/2024   NA 140 03/03/2024   K 4.2 03/03/2024   CL 103 03/03/2024   CREATININE 0.97 03/03/2024   BUN 27 (H) 03/03/2024   CO2 31 03/03/2024   TSH 2.99 03/03/2024   HGBA1C 5.8 03/03/2024     Assessment & Plan:    See Problem List for Assessment and Plan of chronic medical problems.

## 2024-09-03 ENCOUNTER — Ambulatory Visit: Admitting: Internal Medicine

## 2024-09-03 ENCOUNTER — Telehealth: Payer: Self-pay

## 2024-09-03 ENCOUNTER — Encounter: Payer: Self-pay | Admitting: Internal Medicine

## 2024-09-03 VITALS — BP 124/60 | HR 86 | Temp 98.8°F | Ht 64.5 in | Wt 134.0 lb

## 2024-09-03 DIAGNOSIS — R0789 Other chest pain: Secondary | ICD-10-CM | POA: Insufficient documentation

## 2024-09-03 DIAGNOSIS — R911 Solitary pulmonary nodule: Secondary | ICD-10-CM

## 2024-09-03 DIAGNOSIS — F419 Anxiety disorder, unspecified: Secondary | ICD-10-CM

## 2024-09-03 DIAGNOSIS — I2510 Atherosclerotic heart disease of native coronary artery without angina pectoris: Secondary | ICD-10-CM

## 2024-09-03 DIAGNOSIS — F5104 Psychophysiologic insomnia: Secondary | ICD-10-CM

## 2024-09-03 DIAGNOSIS — E782 Mixed hyperlipidemia: Secondary | ICD-10-CM

## 2024-09-03 DIAGNOSIS — I10 Essential (primary) hypertension: Secondary | ICD-10-CM

## 2024-09-03 DIAGNOSIS — N1831 Chronic kidney disease, stage 3a: Secondary | ICD-10-CM

## 2024-09-03 DIAGNOSIS — R7303 Prediabetes: Secondary | ICD-10-CM

## 2024-09-03 DIAGNOSIS — E559 Vitamin D deficiency, unspecified: Secondary | ICD-10-CM

## 2024-09-03 LAB — VITAMIN D 25 HYDROXY (VIT D DEFICIENCY, FRACTURES): VITD: 58.88 ng/mL (ref 30.00–100.00)

## 2024-09-03 LAB — COMPREHENSIVE METABOLIC PANEL WITH GFR
ALT: 14 U/L (ref 0–35)
AST: 16 U/L (ref 0–37)
Albumin: 4.4 g/dL (ref 3.5–5.2)
Alkaline Phosphatase: 50 U/L (ref 39–117)
BUN: 29 mg/dL — ABNORMAL HIGH (ref 6–23)
CO2: 30 meq/L (ref 19–32)
Calcium: 9 mg/dL (ref 8.4–10.5)
Chloride: 105 meq/L (ref 96–112)
Creatinine, Ser: 0.96 mg/dL (ref 0.40–1.20)
GFR: 55.95 mL/min — ABNORMAL LOW (ref >60.00)
Glucose, Bld: 91 mg/dL (ref 70–99)
Potassium: 4.1 meq/L (ref 3.5–5.1)
Sodium: 142 meq/L (ref 135–145)
Total Bilirubin: 0.5 mg/dL (ref 0.2–1.2)
Total Protein: 6.6 g/dL (ref 6.0–8.3)

## 2024-09-03 LAB — CBC WITH DIFFERENTIAL/PLATELET
Basophils Absolute: 0 K/uL (ref 0.0–0.1)
Basophils Relative: 0.5 % (ref 0.0–3.0)
Eosinophils Absolute: 0.1 K/uL (ref 0.0–0.7)
Eosinophils Relative: 1.6 % (ref 0.0–5.0)
HCT: 38.3 % (ref 36.0–46.0)
Hemoglobin: 12.8 g/dL (ref 12.0–15.0)
Lymphocytes Relative: 37 % (ref 12.0–46.0)
Lymphs Abs: 2.2 K/uL (ref 0.7–4.0)
MCHC: 33.4 g/dL (ref 30.0–36.0)
MCV: 88.6 fl (ref 78.0–100.0)
Monocytes Absolute: 0.4 K/uL (ref 0.1–1.0)
Monocytes Relative: 6.8 % (ref 3.0–12.0)
Neutro Abs: 3.2 K/uL (ref 1.4–7.7)
Neutrophils Relative %: 54.1 % (ref 43.0–77.0)
Platelets: 190 K/uL (ref 150.0–400.0)
RBC: 4.32 Mil/uL (ref 3.87–5.11)
RDW: 12.9 % (ref 11.5–15.5)
WBC: 5.9 K/uL (ref 4.0–10.5)

## 2024-09-03 LAB — LIPID PANEL
Cholesterol: 327 mg/dL — ABNORMAL HIGH (ref 0–200)
HDL: 69.7 mg/dL (ref >39.00)
LDL Cholesterol: 212 mg/dL — ABNORMAL HIGH (ref 0–99)
NonHDL: 256.93
Total CHOL/HDL Ratio: 5
Triglycerides: 223 mg/dL — ABNORMAL HIGH (ref 0.0–149.0)
VLDL: 44.6 mg/dL — ABNORMAL HIGH (ref 0.0–40.0)

## 2024-09-03 LAB — TSH: TSH: 3.36 u[IU]/mL (ref 0.35–5.50)

## 2024-09-03 LAB — HEMOGLOBIN A1C: Hgb A1c MFr Bld: 5.6 % (ref 4.6–6.5)

## 2024-09-03 MED ORDER — LIDOCAINE 5 % EX PTCH: 1.0000 | MEDICATED_PATCH | Freq: Every day | CUTANEOUS | 5 refills | Status: DC | PRN

## 2024-09-03 MED ORDER — CITALOPRAM HYDROBROMIDE 10 MG PO TABS: 10.0000 mg | ORAL_TABLET | Freq: Every day | ORAL | 1 refills | Status: AC

## 2024-09-03 MED ORDER — LIDOCAINE 5 % EX PTCH: 1.0000 | MEDICATED_PATCH | Freq: Every day | CUTANEOUS | 5 refills | Status: AC | PRN

## 2024-09-03 MED ORDER — TRAZODONE HCL 50 MG PO TABS: ORAL_TABLET | ORAL | 1 refills | Status: AC

## 2024-09-03 NOTE — Assessment & Plan Note (Signed)
 Chronic No chest pain, shortness of breath or palpitations concerning for angina Follows with cardiology Continue metoprolol  25 mg twice daily No longer on cholesterol medication

## 2024-09-03 NOTE — Telephone Encounter (Signed)
 Copied from CRM [id number]. Topic: Clinical - Medical Advice >> Sep 03, 2024  4:25 PM Thersia BROCKS wrote: Reason for CRM: Jolan from [facility] called in regarding needing revised orders for the patient    [phone]

## 2024-09-03 NOTE — Assessment & Plan Note (Signed)
 Chronic Blood pressure well-controlled Cbc, cmp Continue metoprolol  25 mg twice daily

## 2024-09-03 NOTE — Assessment & Plan Note (Signed)
 Chronic Controlled, Stable Continue trazodone 50-150 mg nightly

## 2024-09-03 NOTE — Assessment & Plan Note (Signed)
Chronic Mild, stable CMP, CBC 

## 2024-09-03 NOTE — Assessment & Plan Note (Signed)
 Chronic Due for follow up Ct scan -- ordered

## 2024-09-03 NOTE — Assessment & Plan Note (Signed)
 Acute Right lateral chest wall/rib/scapular pain She is a poor historian because of her dementia is not able to say how long this is going on, but has had some discomfort and states this area feels different Due for CT chest to follow-up lung nodules which I have ordered-Will hold off on imaging today since she is having the CT scan Monitor symptoms

## 2024-09-03 NOTE — Assessment & Plan Note (Signed)
 Chronic Taking vitamin d daily Check vitamin d level

## 2024-09-03 NOTE — Assessment & Plan Note (Signed)
 Chronic Controlled, Stable Continue Celexa  10 mg daily

## 2024-09-03 NOTE — Assessment & Plan Note (Signed)
 Chronic Lab Results  Component Value Date   HGBA1C 5.8 03/03/2024   Check a1c Low sugar / carb diet

## 2024-09-03 NOTE — Assessment & Plan Note (Signed)
 Chronic Following with cardiology No longer on cholesterol medication Check lipids, cmp

## 2024-09-04 ENCOUNTER — Ambulatory Visit: Payer: Self-pay | Admitting: Internal Medicine

## 2024-09-05 NOTE — Telephone Encounter (Signed)
 Ct ordered

## 2024-09-05 NOTE — Addendum Note (Signed)
 Addended by: GEOFM GLADE PARAS on: 09/05/2024 07:33 PM   Modules accepted: Orders

## 2024-09-06 ENCOUNTER — Other Ambulatory Visit (HOSPITAL_COMMUNITY): Payer: Self-pay

## 2024-09-06 ENCOUNTER — Telehealth: Payer: Self-pay

## 2024-09-06 NOTE — Telephone Encounter (Signed)
 Clinical questions answered and PA submitted.

## 2024-09-06 NOTE — Telephone Encounter (Signed)
 Pharmacy Patient Advocate Encounter   Received notification from CoverMyMeds that prior authorization for Lidocaine  5% patches is required/requested.   Insurance verification completed.   The patient is insured through [facility] ADVANTAGE/RX ADVANCE.   Per test claim: PA required; PA started via CoverMyMeds. KEY [id number] . Waiting for clinical questions to populate.

## 2024-09-07 ENCOUNTER — Telehealth: Payer: Self-pay

## 2024-09-07 NOTE — Telephone Encounter (Signed)
 Pharmacy Patient Advocate Encounter  Received notification from [facility] ADVANTAGE/RX ADVANCE that Prior Authorization for Lidocaine  5% patches has been DENIED.  See denial reason below. No denial letter attached in CMM. Will attach denial letter to Media tab once received.   PA #/Case ID/Reference #: [id number]

## 2024-09-07 NOTE — Telephone Encounter (Signed)
 Copied from CRM #[id number]. Topic: Clinical - Medication Prior Auth >> Sep 06, 2024  4:37 PM Lauren C wrote: Reason for CRM: Pts daughter Graeme (on dpr) calling to let us  know she spoke with insurance and they said they will be denying the lidocaine  patches because it was prescribed for chest wall pain instead of chronic back pain. [name] requests that we send PA with this dx. [phone]

## 2024-09-07 NOTE — Telephone Encounter (Signed)
 Please let daughter know that lidocaine  patches are not covered by insurance.  I would recommend that she try the over-the-counter lidocaine  patch-they are not strong, but may provide some relief.

## 2024-09-09 NOTE — Telephone Encounter (Signed)
 Spoke with daughter today

## 2024-09-09 NOTE — Telephone Encounter (Signed)
 Spoke with [name] today.   Info given that patches not approved.

## 2024-09-14 ENCOUNTER — Ambulatory Visit
Admission: RE | Admit: 2024-09-14 | Discharge: 2024-09-14 | Disposition: A | Source: Ambulatory Visit | Attending: Internal Medicine

## 2024-09-14 ENCOUNTER — Other Ambulatory Visit

## 2024-09-14 DIAGNOSIS — R911 Solitary pulmonary nodule: Secondary | ICD-10-CM

## 2024-09-16 ENCOUNTER — Ambulatory Visit: Payer: Self-pay | Admitting: Internal Medicine

## 2024-09-27 ENCOUNTER — Other Ambulatory Visit (HOSPITAL_COMMUNITY): Payer: Self-pay

## 2024-10-06 ENCOUNTER — Other Ambulatory Visit (HOSPITAL_COMMUNITY): Payer: Self-pay

## 2024-10-08 ENCOUNTER — Ambulatory Visit: Admitting: Pharmacist Clinician (PhC)/ Clinical Pharmacy Specialist

## 2025-01-04 ENCOUNTER — Ambulatory Visit: Admitting: Vascular Surgery

## 2025-03-02 ENCOUNTER — Ambulatory Visit

## 2025-03-04 ENCOUNTER — Ambulatory Visit

## 2025-03-04 ENCOUNTER — Ambulatory Visit: Admitting: Internal Medicine

## 2025-03-11 ENCOUNTER — Other Ambulatory Visit (HOSPITAL_COMMUNITY)

## 2025-07-26 ENCOUNTER — Encounter (INDEPENDENT_AMBULATORY_CARE_PROVIDER_SITE_OTHER): Admitting: Ophthalmology
